# Patient Record
Sex: Female | Born: 1995 | Race: Black or African American | Hispanic: No | Smoking: Never smoker
Health system: Southern US, Community
[De-identification: ages and names within clinical notes are randomized; demographics above are authoritative.]

## PROBLEM LIST (undated history)

## (undated) DIAGNOSIS — D251 Intramural leiomyoma of uterus: Secondary | ICD-10-CM

## (undated) DIAGNOSIS — K5090 Crohn's disease, unspecified, without complications: Secondary | ICD-10-CM

## (undated) DIAGNOSIS — N920 Excessive and frequent menstruation with regular cycle: Secondary | ICD-10-CM

## (undated) HISTORY — DX: Intramural leiomyoma of uterus: D25.1

## (undated) HISTORY — PX: TONSILLECTOMY: SUR1361

## (undated) HISTORY — DX: Excessive and frequent menstruation with regular cycle: N92.0

## (undated) HISTORY — PX: BOWEL RESECTION: SHX1257

## (undated) HISTORY — PX: ADENOIDECTOMY: SUR15

---
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
## 2015-01-25 ENCOUNTER — Emergency Department (HOSPITAL_COMMUNITY)
Admission: EM | Admit: 2015-01-25 | Discharge: 2015-01-25 | Disposition: A | Discharge status: Home / Self Care | Payer: 59 | Attending: Emergency Medicine | Admitting: Emergency Medicine

## 2015-01-25 ENCOUNTER — Emergency Department (HOSPITAL_COMMUNITY): Payer: 59

## 2015-01-25 ENCOUNTER — Encounter (HOSPITAL_COMMUNITY): Payer: Self-pay

## 2015-01-25 DIAGNOSIS — R197 Diarrhea, unspecified: Secondary | ICD-10-CM | POA: Insufficient documentation

## 2015-01-25 DIAGNOSIS — Z3202 Encounter for pregnancy test, result negative: Secondary | ICD-10-CM | POA: Diagnosis not present

## 2015-01-25 DIAGNOSIS — R1031 Right lower quadrant pain: Secondary | ICD-10-CM | POA: Diagnosis present

## 2015-01-25 DIAGNOSIS — N858 Other specified noninflammatory disorders of uterus: Secondary | ICD-10-CM | POA: Diagnosis not present

## 2015-01-25 DIAGNOSIS — J45909 Unspecified asthma, uncomplicated: Secondary | ICD-10-CM | POA: Insufficient documentation

## 2015-01-25 DIAGNOSIS — R110 Nausea: Secondary | ICD-10-CM | POA: Diagnosis not present

## 2015-01-25 DIAGNOSIS — E119 Type 2 diabetes mellitus without complications: Secondary | ICD-10-CM | POA: Insufficient documentation

## 2015-01-25 DIAGNOSIS — Z8719 Personal history of other diseases of the digestive system: Secondary | ICD-10-CM | POA: Insufficient documentation

## 2015-01-25 DIAGNOSIS — R109 Unspecified abdominal pain: Secondary | ICD-10-CM

## 2015-01-25 DIAGNOSIS — N949 Unspecified condition associated with female genital organs and menstrual cycle: Secondary | ICD-10-CM

## 2015-01-25 LAB — CBC WITH DIFFERENTIAL/PLATELET
BASOS ABS: 0 10*3/uL (ref 0.0–0.1)
Basophils Relative: 0 % (ref 0–1)
EOS PCT: 2 % (ref 0–5)
Eosinophils Absolute: 0.2 10*3/uL (ref 0.0–0.7)
HEMATOCRIT: 27.1 % — AB (ref 36.0–46.0)
HEMOGLOBIN: 7.5 g/dL — AB (ref 12.0–15.0)
LYMPHS PCT: 16 % (ref 12–46)
Lymphs Abs: 1.6 10*3/uL (ref 0.7–4.0)
MCH: 18.7 pg — AB (ref 26.0–34.0)
MCHC: 27.7 g/dL — AB (ref 30.0–36.0)
MCV: 67.6 fL — AB (ref 78.0–100.0)
MONO ABS: 1 10*3/uL (ref 0.1–1.0)
Monocytes Relative: 10 % (ref 3–12)
NEUTROS PCT: 72 % (ref 43–77)
Neutro Abs: 7.4 10*3/uL (ref 1.7–7.7)
Platelets: 620 10*3/uL — ABNORMAL HIGH (ref 150–400)
RBC: 4.01 MIL/uL (ref 3.87–5.11)
RDW: 20.6 % — ABNORMAL HIGH (ref 11.5–15.5)
WBC: 10.2 10*3/uL (ref 4.0–10.5)

## 2015-01-25 LAB — URINALYSIS, ROUTINE W REFLEX MICROSCOPIC
BILIRUBIN URINE: NEGATIVE
Glucose, UA: NEGATIVE mg/dL
HGB URINE DIPSTICK: NEGATIVE
KETONES UR: NEGATIVE mg/dL
LEUKOCYTES UA: NEGATIVE
Nitrite: NEGATIVE
PROTEIN: NEGATIVE mg/dL
SPECIFIC GRAVITY, URINE: 1.01 (ref 1.005–1.030)
Urobilinogen, UA: 2 mg/dL — ABNORMAL HIGH (ref 0.0–1.0)
pH: 7 (ref 5.0–8.0)

## 2015-01-25 LAB — BASIC METABOLIC PANEL
Anion gap: 7 (ref 5–15)
BUN: 8 mg/dL (ref 6–20)
CALCIUM: 8.3 mg/dL — AB (ref 8.9–10.3)
CO2: 26 mmol/L (ref 22–32)
Chloride: 104 mmol/L (ref 101–111)
Creatinine, Ser: 0.75 mg/dL (ref 0.44–1.00)
GFR calc Af Amer: >60 mL/min (ref >60)
Glucose, Bld: 108 mg/dL — ABNORMAL HIGH (ref 65–99)
Potassium: 3.7 mmol/L (ref 3.5–5.1)
Sodium: 137 mmol/L (ref 135–145)

## 2015-01-25 LAB — PREGNANCY, URINE: PREG TEST UR: NEGATIVE

## 2015-01-25 MED ORDER — ONDANSETRON HCL 4 MG/2ML IJ SOLN
4.0000 mg | Freq: Once | INTRAMUSCULAR | Status: AC
  Administered 2015-01-25: 4 mg via INTRAVENOUS
  Filled 2015-01-25: qty 2

## 2015-01-25 MED ORDER — IOHEXOL 300 MG/ML  SOLN
50.0000 mL | Freq: Once | INTRAMUSCULAR | Status: AC | PRN
  Administered 2015-01-25: 50 mL via ORAL

## 2015-01-25 MED ORDER — ONDANSETRON 4 MG PO TBDP: 4.0000 mg | ORAL_TABLET | Freq: Three times a day (TID) | ORAL | Status: DC | PRN

## 2015-01-25 MED ORDER — HYDROCODONE-ACETAMINOPHEN 5-325 MG PO TABS: 1.0000 | ORAL_TABLET | Freq: Four times a day (QID) | ORAL | Status: DC | PRN

## 2015-01-25 MED ORDER — SODIUM CHLORIDE 0.9 % IV BOLUS (SEPSIS)
1000.0000 mL | Freq: Once | INTRAVENOUS | Status: AC
  Administered 2015-01-25: 1000 mL via INTRAVENOUS

## 2015-01-25 MED ORDER — MORPHINE SULFATE 4 MG/ML IJ SOLN
4.0000 mg | Freq: Once | INTRAMUSCULAR | Status: AC
  Administered 2015-01-25: 4 mg via INTRAVENOUS
  Filled 2015-01-25: qty 1

## 2015-01-25 MED ORDER — IOHEXOL 300 MG/ML  SOLN
100.0000 mL | Freq: Once | INTRAMUSCULAR | Status: AC | PRN
  Administered 2015-01-25: 100 mL via INTRAVENOUS

## 2015-01-25 MED ORDER — CIPROFLOXACIN HCL 500 MG PO TABS: 500.0000 mg | ORAL_TABLET | Freq: Two times a day (BID) | ORAL | Status: DC

## 2015-01-25 MED ORDER — METRONIDAZOLE 500 MG PO TABS: 500.0000 mg | ORAL_TABLET | Freq: Two times a day (BID) | ORAL | Status: DC

## 2015-01-25 NOTE — ED Notes (Signed)
Pt complains of right abd pain for three weeks, no vomiting, a little diarrhea and she states that sometimes she's nauseated

## 2015-01-25 NOTE — ED Provider Notes (Signed)
CSN: [id number]     Arrival date & time 01/25/15  0041 History   First MD Initiated Contact with Patient 01/25/15 0200     Chief Complaint  Patient presents with  . Abdominal Pain     (Consider location/radiation/quality/duration/timing/severity/associated sxs/prior Treatment) HPI Comments: Patient is a 19 year old female past medical history significant for DM, asthma, Crohn's disease presenting to the emergency department for 3 weeks of worsening right lower quadrant pain with associated nausea and intermittent episodes of nonbloody diarrhea. She states she has tried taking ibuprofen with no improvement. She states her pain is worsened significantly when she walks or coughs this evening. She denies any fevers, vomiting, recent antibody use or travel outside the [place]. No abdominal surgical history noted.  Patient is a 19 y.o. female presenting with abdominal pain.  Abdominal Pain Pain location:  RLQ Pain radiates to:  Does not radiate Pain severity:  Severe Onset quality:  Gradual Duration:  3 weeks Timing:  Constant Progression:  Worsening Context: awakening from sleep   Relieved by:  Nothing Worsened by:  Coughing and position changes Ineffective treatments:  NSAIDs Associated symptoms: diarrhea and nausea   Associated symptoms: no chest pain, no fever, no hematuria, no shortness of breath, no vaginal bleeding, no vaginal discharge and no vomiting   Risk factors: has not had multiple surgeries and not pregnant     Past Medical History  Diagnosis Date  . Crohn's disease   . Asthma   . Diabetes mellitus    Past Surgical History  Procedure Laterality Date  . Tonsillectomy    . Adenoidectomy     History reviewed. No pertinent family history. History  Substance Use Topics  . Smoking status: Never Smoker   . Smokeless tobacco: Not on file  . Alcohol Use: No   OB History    No data available     Review of Systems  Constitutional: Negative for fever.  Respiratory:  Negative for shortness of breath.   Cardiovascular: Negative for chest pain.  Gastrointestinal: Positive for nausea, abdominal pain and diarrhea. Negative for vomiting.  Genitourinary: Negative for hematuria, vaginal bleeding and vaginal discharge.  All other systems reviewed and are negative.     Allergies  Review of patient's allergies indicates no known allergies.  Home Medications   Prior to Admission medications   Medication Sig Start Date End Date Taking? Authorizing Provider  ibuprofen (ADVIL,MOTRIN) 200 MG tablet Take 400 mg by mouth 2 (two) times daily as needed (pain).   Yes Historical Provider, MD  ciprofloxacin (CIPRO) 500 MG tablet Take 1 tablet (500 mg total) by mouth every 12 (twelve) hours. 01/25/15   Jasiah Elsen, PA-C  HYDROcodone-acetaminophen (NORCO/VICODIN) 5-325 MG per tablet Take 1-2 tablets by mouth every 6 (six) hours as needed for severe pain. 01/25/15   Riaz Onorato, PA-C  metroNIDAZOLE (FLAGYL) 500 MG tablet Take 1 tablet (500 mg total) by mouth 2 (two) times daily. 01/25/15   Angelmarie Ponzo, PA-C  ondansetron (ZOFRAN ODT) 4 MG disintegrating tablet Take 1 tablet (4 mg total) by mouth every 8 (eight) hours as needed for nausea. 01/25/15   Aarush Stukey, PA-C   BP 120/66 mmHg  Pulse 87  Temp(Src) 97.8 F (36.6 C) (Oral)  Resp 18  SpO2 96%  LMP 12/22/2014 Physical Exam  Constitutional: She is oriented to person, place, and time. She appears well-developed and well-nourished.  HENT:  Head: Normocephalic and atraumatic.  Eyes: Conjunctivae are normal.  Neck: Neck supple.  Cardiovascular: Normal rate, regular rhythm  and normal heart sounds.   Abdominal: Soft. Bowel sounds are normal. There is tenderness in the right lower quadrant. There is no guarding.  Musculoskeletal: Normal range of motion.  Neurological: She is alert and oriented to person, place, and time.  Skin: Skin is warm and dry.  Nursing note and vitals  reviewed.   ED Course  Procedures (including critical care time) Medications  sodium chloride 0.9 % bolus 1,000 mL (0 mLs Intravenous Stopped 01/25/15 0529)  ondansetron (ZOFRAN) injection 4 mg (4 mg Intravenous Given 01/25/15 0249)  morphine 4 MG/ML injection 4 mg (4 mg Intravenous Given 01/25/15 0249)  iohexol (OMNIPAQUE) 300 MG/ML solution 50 mL (50 mLs Oral Contrast Given 01/25/15 0318)  iohexol (OMNIPAQUE) 300 MG/ML solution 100 mL (100 mLs Intravenous Contrast Given 01/25/15 0424)    Labs Review Labs Reviewed  CBC WITH DIFFERENTIAL/PLATELET - Abnormal; Notable for the following:    Hemoglobin 7.5 (*)    HCT 27.1 (*)    MCV 67.6 (*)    MCH 18.7 (*)    MCHC 27.7 (*)    RDW 20.6 (*)    Platelets 620 (*)    All other components within normal limits  BASIC METABOLIC PANEL - Abnormal; Notable for the following:    Glucose, Bld 108 (*)    Calcium 8.3 (*)    All other components within normal limits  URINALYSIS, ROUTINE W REFLEX MICROSCOPIC (NOT AT [facility]) - Abnormal; Notable for the following:    APPearance CLOUDY (*)    Urobilinogen, UA 2.0 (*)    All other components within normal limits  PREGNANCY, URINE    Imaging Review Ct Abdomen Pelvis W Contrast  01/25/2015   CLINICAL DATA:  RIGHT lower abdominal pain for 3 weeks, nausea, diarrhea. History of Crohn's and diabetes.  EXAM: CT ABDOMEN AND PELVIS WITH CONTRAST  TECHNIQUE: Multidetector CT imaging of the abdomen and pelvis was performed using the standard protocol following bolus administration of intravenous contrast.  CONTRAST:  OMNIPAQUE IOHEXOL 300 MG/ML  SOLN  COMPARISON:  CT abdomen and pelvis May 24, 2011  FINDINGS: LUNG BASES: Included view of the lung bases are clear. Visualized heart and pericardium are unremarkable.  SOLID ORGANS: The liver, spleen, gallbladder, pancreas and adrenal glands are unremarkable.  GASTROINTESTINAL TRACT: Hyperemic, severely edematous, inflamed terminal ileum and cecum. Surrounding  inflammatory changes and effusion without abscess. Enteric contrast has not yet reached the distal small bowel. The appendix is not discretely identified, however there are no inflammatory changes in the right lower quadrant. No bowel obstruction.  KIDNEYS/ URINARY TRACT: Kidneys are orthotopic, demonstrating symmetric enhancement. No nephrolithiasis, hydronephrosis or solid renal masses. The unopacified ureters are normal in course and caliber. Urinary bladder is partially distended and unremarkable.  PERITONEUM/RETROPERITONEUM: Aortoiliac vessels are normal in course and caliber. Mild RIGHT lower quadrant lymphadenopathy is likely likely reactive. 6.2 x 2.7 cm homogeneously hypodense LEFT adnexal simple cyst. No intraperitoneal free air.  SOFT TISSUE/OSSEOUS STRUCTURES: Non-suspicious.  IMPRESSION: Severe inflammatory changes of the terminal ileum/cecum consistent with acute Crohn's disease. No immediate complication.  6.2 x 2.7 cm LEFT adnexal cyst for which follow-up pelvic ultrasound in 6-12 weeks is recommended. This recommendation follows ACR consensus guidelines: White Paper of the ACR Incidental Findings Committee II on Adnexal Findings. J Am Coll Radiol [phone].   Electronically Signed   By: Awilda Metro M.D.   On: 01/25/2015 04:58     EKG Interpretation None      5:10 AM pain is improved. Patient  does not have a gastroenterologist, has not had a colonoscopy. Discussed CT scan and laboratory results with patient. Discussed discharge home and antibiotics and required follow-ups.  MDM   Final diagnoses:  Abdominal pain in female patient  Adnexal cyst    Filed Vitals:   01/25/15 0349  BP: 120/66  Pulse: 87  Temp:   Resp: 18   Afebrile, NAD, non-toxic appearing, AAOx4.   I have reviewed nursing notes, vital signs, and all lab and imaging results as noted above.  Patient is nontoxic, nonseptic appearing, in no apparent distress.  Patient's pain and other symptoms  adequately managed in emergency department.  Fluid bolus given.  Labs, imaging and vitals reviewed.  Patient does not meet the SIRS or Sepsis criteria.  On repeat exam patient does not have a surgical abdomin and there are no peritoneal signs.  CT scan reveals severe inflammatory changes terminal ileum/cecum consistent with acute Crohn's disease also noted to have a large left adnexal cyst. Given patient has not seen a gastroenterologist or had a colonoscopy will hold on cigarettes of this time will treat with anti-biotics pain medicine.  Patient discharged home with symptomatic treatment and given strict instructions for follow-up with their primary care physician, gastroenterologist and OB/GYN.  I have also discussed reasons to return immediately to the ER.  Patient expresses understanding and agrees with plan. Patient d/w with Dr. Norlene Campbell, agrees with plan.        Francee Piccolo, PA-C 01/25/15 0543  Marisa Severin, MD 01/25/15 [phone]

## 2015-01-25 NOTE — ED Notes (Signed)
Pt states that it hurts worse when she walks or coughs

## 2015-01-25 NOTE — Discharge Instructions (Signed)
Please follow up with your primary care physician in 1-2 days. If you do not have one please call the [facility] and wellness Center number listed above. Please follow up with the gastroenterologist at [facility] to schedule a follow up appointment.  Please follow up with OB/GYN to schedule a follow up appointment. Please take pain medication and/or muscle relaxants as prescribed and as needed for pain. Please do not drive on narcotic pain medication or on muscle relaxants. Please take your antibiotic until completion. Please read all discharge instructions and return precautions.   Crohn Disease Crohn disease is a long-term (chronic) soreness and redness (inflammation) of the intestines (bowel). It can affect any portion of the digestive tract, from the mouth to the anus. It can also cause problems outside the digestive tract. Crohn disease is closely related to a disease called ulcerative colitis (together, these two diseases are called inflammatory bowel disease).  CAUSES  The cause of Crohn disease is not known. One Nelva Bush is that, in an easily affected person, the immune system is triggered to attack the body's own digestive tissue. Crohn disease runs in families. It seems to be more common in certain geographic areas and amongst certain races. There are no clear-cut dietary causes.  SYMPTOMS  Crohn disease can cause many different symptoms since it can affect many different parts of the body. Symptoms include:  Fatigue.  Weight loss.  Chronic diarrhea, sometime bloody.  Abdominal pain and cramps.  Fever.  Ulcers or canker sores in the mouth or rectum.  Anemia (low red blood cells).  Arthritis, skin problems, and eye problems may occur. Complications of Crohn disease can include:  Series of holes (perforation) of the bowel.  Portions of the intestines sticking to each other (adhesions).  Obstruction of the bowel.  Fistula formation, typically in the rectal area but also in other  areas. A fistula is an opening between the bowels and the outside, or between the bowels and another organ.  A painful crack in the mucous membrane of the anus (rectal fissure). DIAGNOSIS  Your caregiver may suspect Crohn disease based on your symptoms and an exam. Blood tests may confirm that there is a problem. You may be asked to submit a stool specimen for examination. X-rays and CT scans may be necessary. Ultimately, the diagnosis is usually made after a procedure that uses a flexible tube that is inserted via your mouth or your anus. This is done under sedation and is called either an upper endoscopy or colonoscopy. With these tests, the specialist can take tiny tissue samples and remove them from the inside of the bowel (biopsy). Examination of this biopsy tissue under a microscope can reveal Crohn disease as the cause of your symptoms. Due to the many different forms that Crohn disease can take, symptoms may be present for several years before a diagnosis is made. TREATMENT  Medications are often used to decrease inflammation and control the immune system. These include medicines related to aspirin, steroid medications, and newer and stronger medications to slow down the immune system. Some medications may be used as suppositories or enemas. A number of other medications are used or have been studied. Your caregiver will make specific recommendations. HOME CARE INSTRUCTIONS   Symptoms such as diarrhea can be controlled with medications. Avoid foods that have a laxative effect such as fresh fruit, vegetables, and dairy products. During flare-ups, you can rest your bowel by refraining from solid foods. Drink clear liquids frequently during the day. (Electrolyte or  rehydrating fluids are best. Your caregiver can help you with suggestions.) Drink often to prevent loss of body fluids (dehydration). When diarrhea has cleared, eat small meals and more frequently. Avoid food additives and stimulants such  as caffeine (coffee, tea, or chocolate). Enzyme supplements may help if you develop intolerance to a sugar in dairy products (lactose). Ask your caregiver or dietitian about specific dietary instructions.  Try to maintain a positive attitude. Learn relaxation techniques such as self-hypnosis, mental imaging, and muscle relaxation.  If possible, avoid stresses which can aggravate your condition.  Exercise regularly.  Follow your diet.  Always get plenty of rest. SEEK MEDICAL CARE IF:   Your symptoms fail to improve after a week or two of new treatment.  You experience continued weight loss.  You have ongoing cramps or loose bowels.  You develop a new skin rash, skin sores, or eye problems. SEEK IMMEDIATE MEDICAL CARE IF:   You have worsening of your symptoms or develop new symptoms.  You have a fever.  You develop bloody diarrhea.  You develop severe abdominal pain. MAKE SURE YOU:   Understand these instructions.  Will watch your condition.  Will get help right away if you are not doing well or get worse. Document Released: 04/04/2005 Document Revised: 11/09/2013 Document Reviewed: 03/03/2007 [facility] Patient Information 2015 [place], [place]. This information is not intended to replace advice given to you by your health care provider. Make sure you discuss any questions you have with your health care provider.  Ovarian Cyst An ovarian cyst is a fluid-filled sac that forms on an ovary. The ovaries are small organs that produce eggs in women. Various types of cysts can form on the ovaries. Most are not cancerous. Many do not cause problems, and they often go away on their own. Some may cause symptoms and require treatment. Common types of ovarian cysts include:  Functional cysts--These cysts may occur every month during the menstrual cycle. This is normal. The cysts usually go away with the next menstrual cycle if the woman does not get pregnant. Usually, there are no symptoms  with a functional cyst.  Endometrioma cysts--These cysts form from the tissue that lines the uterus. They are also called "chocolate cysts" because they become filled with blood that turns brown. This type of cyst can cause pain in the lower abdomen during intercourse and with your menstrual period.  Cystadenoma cysts--This type develops from the cells on the outside of the ovary. These cysts can get very big and cause lower abdomen pain and pain with intercourse. This type of cyst can twist on itself, cut off its blood supply, and cause severe pain. It can also easily rupture and cause a lot of pain.  Dermoid cysts--This type of cyst is sometimes found in both ovaries. These cysts may contain different kinds of body tissue, such as skin, teeth, hair, or cartilage. They usually do not cause symptoms unless they get very big.  Theca lutein cysts--These cysts occur when too much of a certain hormone (human chorionic gonadotropin) is produced and overstimulates the ovaries to produce an egg. This is most common after procedures used to assist with the conception of a baby (in vitro fertilization). CAUSES   Fertility drugs can cause a condition in which multiple large cysts are formed on the ovaries. This is called ovarian hyperstimulation syndrome.  A condition called polycystic ovary syndrome can cause hormonal imbalances that can lead to nonfunctional ovarian cysts. SIGNS AND SYMPTOMS  Many ovarian cysts do  not cause symptoms. If symptoms are present, they may include:  Pelvic pain or pressure.  Pain in the lower abdomen.  Pain during sexual intercourse.  Increasing girth (swelling) of the abdomen.  Abnormal menstrual periods.  Increasing pain with menstrual periods.  Stopping having menstrual periods without being pregnant. DIAGNOSIS  These cysts are commonly found during a routine or annual pelvic exam. Tests may be ordered to find out more about the cyst. These tests may  include:  Ultrasound.  X-ray of the pelvis.  CT scan.  MRI.  Blood tests. TREATMENT  Many ovarian cysts go away on their own without treatment. Your health care provider may want to check your cyst regularly for 2-3 months to see if it changes. For women in menopause, it is particularly important to monitor a cyst closely because of the higher rate of ovarian cancer in menopausal women. When treatment is needed, it may include any of the following:  A procedure to drain the cyst (aspiration). This may be done using a long needle and ultrasound. It can also be done through a laparoscopic procedure. This involves using a thin, lighted tube with a tiny camera on the end (laparoscope) inserted through a small incision.  Surgery to remove the whole cyst. This may be done using laparoscopic surgery or an open surgery involving a larger incision in the lower abdomen.  Hormone treatment or birth control pills. These methods are sometimes used to help dissolve a cyst. HOME CARE INSTRUCTIONS   Only take over-the-counter or prescription medicines as directed by your health care provider.  Follow up with your health care provider as directed.  Get regular pelvic exams and Pap tests. SEEK MEDICAL CARE IF:   Your periods are late, irregular, or painful, or they stop.  Your pelvic pain or abdominal pain does not go away.  Your abdomen becomes larger or swollen.  You have pressure on your bladder or trouble emptying your bladder completely.  You have pain during sexual intercourse.  You have feelings of fullness, pressure, or discomfort in your stomach.  You lose weight for no apparent reason.  You feel generally ill.  You become constipated.  You lose your appetite.  You develop acne.  You have an increase in body and facial hair.  You are gaining weight, without changing your exercise and eating habits.  You think you are pregnant. SEEK IMMEDIATE MEDICAL CARE IF:   You have  increasing abdominal pain.  You feel sick to your stomach (nauseous), and you throw up (vomit).  You develop a fever that comes on suddenly.  You have abdominal pain during a bowel movement.  Your menstrual periods become heavier than usual. MAKE SURE YOU:  Understand these instructions.  Will watch your condition.  Will get help right away if you are not doing well or get worse. Document Released: 06/25/2005 Document Revised: 06/30/2013 Document Reviewed: 03/02/2013 [facility] Patient Information 2015 [place], [place]. This information is not intended to replace advice given to you by your health care provider. Make sure you discuss any questions you have with your health care provider.

## 2019-12-02 ENCOUNTER — Encounter: Payer: Self-pay | Admitting: Internal Medicine

## 2019-12-02 ENCOUNTER — Ambulatory Visit (INDEPENDENT_AMBULATORY_CARE_PROVIDER_SITE_OTHER): Payer: 59 | Admitting: Internal Medicine

## 2019-12-02 ENCOUNTER — Other Ambulatory Visit: Payer: Self-pay

## 2019-12-02 VITALS — BP 128/82 | HR 104 | Temp 98.0°F | Ht 63.0 in | Wt 215.2 lb

## 2019-12-02 DIAGNOSIS — Z6838 Body mass index (BMI) 38.0-38.9, adult: Secondary | ICD-10-CM

## 2019-12-02 DIAGNOSIS — E559 Vitamin D deficiency, unspecified: Secondary | ICD-10-CM

## 2019-12-02 DIAGNOSIS — E1100 Type 2 diabetes mellitus with hyperosmolarity without nonketotic hyperglycemic-hyperosmolar coma (NKHHC): Secondary | ICD-10-CM

## 2019-12-02 DIAGNOSIS — K50919 Crohn's disease, unspecified, with unspecified complications: Secondary | ICD-10-CM

## 2019-12-02 DIAGNOSIS — Z23 Encounter for immunization: Secondary | ICD-10-CM

## 2019-12-02 NOTE — Patient Instructions (Signed)
Diabetes Mellitus and Exercise Exercising regularly is important for your overall health, especially when you have diabetes (diabetes mellitus). Exercising is not only about losing weight. It has many other health benefits, such as increasing muscle strength and bone density and reducing body fat and stress. This leads to improved fitness, flexibility, and endurance, all of which result in better overall health. Exercise has additional benefits for people with diabetes, including:  Reducing appetite.  Helping to lower and control blood glucose.  Lowering blood pressure.  Helping to control amounts of fatty substances (lipids) in the blood, such as cholesterol and triglycerides.  Helping the body to respond better to insulin (improving insulin sensitivity).  Reducing how much insulin the body needs.  Decreasing the risk for heart disease by: ? Lowering cholesterol and triglyceride levels. ? Increasing the levels of good cholesterol. ? Lowering blood glucose levels. What is my activity plan? Your health care provider or certified diabetes educator can help you make a plan for the type and frequency of exercise (activity plan) that works for you. Make sure that you:  Do at least 150 minutes of moderate-intensity or vigorous-intensity exercise each week. This could be brisk walking, biking, or water aerobics. ? Do stretching and strength exercises, such as yoga or weightlifting, at least 2 times a week. ? Spread out your activity over at least 3 days of the week.  Get some form of physical activity every day. ? Do not go more than 2 days in a row without some kind of physical activity. ? Avoid being inactive for more than 30 minutes at a time. Take frequent breaks to walk or stretch.  Choose a type of exercise or activity that you enjoy, and set realistic goals.  Start slowly, and gradually increase the intensity of your exercise over time. What do I need to know about managing my  diabetes?   Check your blood glucose before and after exercising. ? If your blood glucose is 240 mg/dL (13.3 mmol/L) or higher before you exercise, check your urine for ketones. If you have ketones in your urine, do not exercise until your blood glucose returns to normal. ? If your blood glucose is 100 mg/dL (5.6 mmol/L) or lower, eat a snack containing 15-20 grams of carbohydrate. Check your blood glucose 15 minutes after the snack to make sure that your level is above 100 mg/dL (5.6 mmol/L) before you start your exercise.  Know the symptoms of low blood glucose (hypoglycemia) and how to treat it. Your risk for hypoglycemia increases during and after exercise. Common symptoms of hypoglycemia can include: ? Hunger. ? Anxiety. ? Sweating and feeling clammy. ? Confusion. ? Dizziness or feeling light-headed. ? Increased heart rate or palpitations. ? Blurry vision. ? Tingling or numbness around the mouth, lips, or tongue. ? Tremors or shakes. ? Irritability.  Keep a rapid-acting carbohydrate snack available before, during, and after exercise to help prevent or treat hypoglycemia.  Avoid injecting insulin into areas of the body that are going to be exercised. For example, avoid injecting insulin into: ? The arms, when playing tennis. ? The legs, when jogging.  Keep records of your exercise habits. Doing this can help you and your health care provider adjust your diabetes management plan as needed. Write down: ? Food that you eat before and after you exercise. ? Blood glucose levels before and after you exercise. ? The type and amount of exercise you have done. ? When your insulin is expected to peak, if you use   insulin. Avoid exercising at times when your insulin is peaking.  When you start a new exercise or activity, work with your health care provider to make sure the activity is safe for you, and to adjust your insulin, medicines, or food intake as needed.  Drink plenty of water while  you exercise to prevent dehydration or heat stroke. Drink enough fluid to keep your urine clear or pale yellow. Summary  Exercising regularly is important for your overall health, especially when you have diabetes (diabetes mellitus).  Exercising has many health benefits, such as increasing muscle strength and bone density and reducing body fat and stress.  Your health care provider or certified diabetes educator can help you make a plan for the type and frequency of exercise (activity plan) that works for you.  When you start a new exercise or activity, work with your health care provider to make sure the activity is safe for you, and to adjust your insulin, medicines, or food intake as needed. This information is not intended to replace advice given to you by your health care provider. Make sure you discuss any questions you have with your health care provider. Document Revised: 01/17/2017 Document Reviewed: 12/05/2015 Elsevier Patient Education  2020 Elsevier Inc.  

## 2019-12-04 NOTE — Progress Notes (Signed)
This visit occurred during the SARS-CoV-2 public health emergency.  Safety protocols were in place, including screening questions prior to the visit, additional usage of staff PPE, and extensive cleaning of exam room while observing appropriate contact time as indicated for disinfecting solutions.  Subjective:     Patient ID: Connie Williamson , female    DOB: [birth year] , 24 y.o.   MRN: [id number]   Chief Complaint  Patient presents with  . Establish Care  . Diabetes    HPI  She is here today to establish care.  She was referred by her mother, A. B., who is also a patient of [facility] . She reports having a history of type 2 diabetes, but states she was diagnosed with diabetes at age 56. She contacted her mother during the visit, who confirmed what the patient said. She is not followed by Endocrinology, states she has been sharing her Mom's insulin. She is currently taking Tresiba 60 units at night. She checks her sugars sporadically, but admits they are elevated.  She adds that she lost over 50 pounds over the past several years.  She also reports h/o Crohn's disease. She is followed by [facility].   She currently works from home. She works for [employer], in Therapist, art. She has been doing this for about three months. She is an alum of [employer], she received Bachelor's in [employer].   Diabetes She presents for her initial diabetic visit. No MedicAlert identification noted. The initial diagnosis of diabetes was made 15 years ago. Her disease course has been worsening. There are no hypoglycemic associated symptoms. Associated symptoms include polydipsia and polyuria. There are no hypoglycemic complications. Risk factors for coronary artery disease include diabetes mellitus, obesity and sedentary lifestyle. Current diabetic treatment includes diet and insulin injections. Her breakfast blood glucose is taken between 9-10 am. Her breakfast blood glucose range is generally 140-180 mg/dl.      Past Medical History:  Diagnosis Date  . Asthma   . Crohn's disease ([place])   . Diabetes mellitus      Family History  Problem Relation Age of Onset  . Diabetes Mother   . Kidney failure Mother   . Hypertension Mother   . Healthy Father   . Healthy Sister   . Healthy Brother   . Diabetes Maternal Grandmother   . Hypertension Maternal Grandmother   . Diabetes Maternal Grandfather   . Hypertension Maternal Grandfather      Current Outpatient Medications:  .  ibuprofen (ADVIL,MOTRIN) 200 MG tablet, Take 400 mg by mouth 2 (two) times daily as needed (pain)., Disp: , Rfl:  .  ondansetron (ZOFRAN ODT) 4 MG disintegrating tablet, Take 1 tablet (4 mg total) by mouth every 8 (eight) hours as needed for nausea., Disp: 10 tablet, Rfl: 0 .  ciprofloxacin (CIPRO) 500 MG tablet, Take 1 tablet (500 mg total) by mouth every 12 (twelve) hours. (Patient not taking: Reported on 12/02/2019), Disp: 20 tablet, Rfl: 0 .  HYDROcodone-acetaminophen (NORCO/VICODIN) 5-325 MG per tablet, Take 1-2 tablets by mouth every 6 (six) hours as needed for severe pain. (Patient not taking: Reported on 12/02/2019), Disp: 10 tablet, Rfl: 0 .  metroNIDAZOLE (FLAGYL) 500 MG tablet, Take 1 tablet (500 mg total) by mouth 2 (two) times daily. (Patient not taking: Reported on 12/02/2019), Disp: 20 tablet, Rfl: 0   No Known Allergies    The patient states she uses none for birth control. Last LMP was Patient's last menstrual period was 11/07/2019 (exact  date).. Negative for Dysmenorrhea  Negative for: breast discharge, breast lump(s), breast pain and breast self exam. Associated symptoms include abnormal vaginal bleeding. Pertinent negatives include abnormal bleeding (hematology), anxiety, decreased libido, depression, difficulty falling sleep, dyspareunia, history of infertility, nocturia, sexual dysfunction, sleep disturbances, urinary incontinence, urinary urgency, vaginal discharge and vaginal itching. Diet regular.The patient  states her exercise level is  intermittent.   . The patient's tobacco use is:  Social History   Tobacco Use  Smoking Status Never Smoker  Smokeless Tobacco Never Used  . She has been exposed to passive smoke. The patient's alcohol use is:  Social History   Substance and Sexual Activity  Alcohol Use Yes   Comment: Socially    Review of Systems  Constitutional: Negative.   HENT: Negative.   Respiratory: Negative.   Cardiovascular: Negative.   Gastrointestinal: Negative.   Endocrine: Positive for polydipsia and polyuria.  Genitourinary: Negative.   Neurological: Negative.   Psychiatric/Behavioral: Negative.      Today's Vitals   12/02/19 1455  BP: 128/82  Pulse: (!) 104  Temp: 98 F (36.7 C)  Weight: 215 lb 3.2 oz (97.6 kg)  Height: '5\' 3"'$  (1.6 m)   Body mass index is 38.12 kg/m.   Wt Readings from Last 3 Encounters:  12/02/19 215 lb 3.2 oz (97.6 kg)  06/12/11 158 lb (71.7 kg) (90 %, Z= 1.30)*   * Growth percentiles are based on CDC (Girls, 2-20 Years) data.     Objective:  Physical Exam Vitals and nursing note reviewed.  Constitutional:      Appearance: Normal appearance. She is obese.  HENT:     Head: Normocephalic and atraumatic.  Cardiovascular:     Rate and Rhythm: Normal rate and regular rhythm.     Heart sounds: Normal heart sounds.  Pulmonary:     Effort: Pulmonary effort is normal.     Breath sounds: Normal breath sounds.  Skin:    General: Skin is warm.  Neurological:     General: No focal deficit present.     Mental Status: She is alert.  Psychiatric:        Mood and Affect: Mood normal.        Behavior: Behavior normal.         Assessment And Plan:     1. Type 2 diabetes mellitus with hyperosmolarity without coma, without long-term current use of insulin ([place])  I will check labs as listed below. She is willing to see endocrinology if insulin and GAD antibodies are positive. She will continue with Tresiba, 60 units nightly for now.  She is encouraged to avoid sugary beverages and to incorporate more exercise into her daily routine.   - Glutamic acid decarboxylase auto abs - CMP14+EGFR - TSH - Hemoglobin A1c - CBC no Diff  2. Crohn's disease with complication, unspecified gastrointestinal tract location [facility])  Chronic, as per GI. I did review her records through [facility]. She is encouraged to keep all upcoming GI appointments.   3. Vitamin D deficiency disease  I WILL CHECK A VIT D LEVEL AND SUPPLEMENT AS NEEDED.  ALSO ENCOURAGED TO SPEND 15 MINUTES IN THE SUN DAILY.  - Vitamin D (25 hydroxy)  4. Class 2 severe obesity due to excess calories with serious comorbidity and body mass index (BMI) of 38.0 to 38.9 in adult [facility])  She was congratulated on her weight loss thus far and encouraged to strive for BMI less than 30 to decrease cardiac risk. She is advised  to exercise no less than 150 minutes per week. She is also encouraged to avoid sugary beverages.   5. Immunization due  She is due for Pneumovax-23. She would like to consider at her next visit.   Maximino Greenland, MD    THE PATIENT IS ENCOURAGED TO PRACTICE SOCIAL DISTANCING DUE TO THE COVID-19 PANDEMIC.

## 2019-12-08 LAB — CBC
Hematocrit: 46.8 % — ABNORMAL HIGH (ref 34.0–46.6)
Hemoglobin: 14.7 g/dL (ref 11.1–15.9)
MCH: 27.2 pg (ref 26.6–33.0)
MCHC: 31.4 g/dL — ABNORMAL LOW (ref 31.5–35.7)
MCV: 87 fL (ref 79–97)
Platelets: 417 10*3/uL (ref 150–450)
RBC: 5.4 x10E6/uL — ABNORMAL HIGH (ref 3.77–5.28)
RDW: 12.1 % (ref 11.7–15.4)
WBC: 11.7 10*3/uL — ABNORMAL HIGH (ref 3.4–10.8)

## 2019-12-08 LAB — HEMOGLOBIN A1C
Est. average glucose Bld gHb Est-mCnc: 387 mg/dL
Hgb A1c MFr Bld: 15.1 % — ABNORMAL HIGH (ref 4.8–5.6)

## 2019-12-08 LAB — CMP14+EGFR
ALT: 8 IU/L (ref 0–32)
AST: 8 IU/L (ref 0–40)
Albumin/Globulin Ratio: 1.2 (ref 1.2–2.2)
Albumin: 4.3 g/dL (ref 3.9–5.0)
Alkaline Phosphatase: 165 IU/L — ABNORMAL HIGH (ref 48–121)
BUN/Creatinine Ratio: 18 (ref 9–23)
BUN: 13 mg/dL (ref 6–20)
Bilirubin Total: <0.2 mg/dL (ref 0.0–1.2)
CO2: 18 mmol/L — ABNORMAL LOW (ref 20–29)
Calcium: 9.9 mg/dL (ref 8.7–10.2)
Chloride: 98 mmol/L (ref 96–106)
Creatinine, Ser: 0.74 mg/dL (ref 0.57–1.00)
GFR calc Af Amer: 130 mL/min/{1.73_m2} (ref >59)
GFR calc non Af Amer: 113 mL/min/{1.73_m2} (ref >59)
Globulin, Total: 3.6 g/dL (ref 1.5–4.5)
Glucose: 335 mg/dL — ABNORMAL HIGH (ref 65–99)
Potassium: 4.4 mmol/L (ref 3.5–5.2)
Sodium: 134 mmol/L (ref 134–144)
Total Protein: 7.9 g/dL (ref 6.0–8.5)

## 2019-12-08 LAB — GLUTAMIC ACID DECARBOXYLASE AUTO ABS: Glutamic Acid Decarb Ab: <5 U/mL (ref 0.0–5.0)

## 2019-12-08 LAB — VITAMIN D 25 HYDROXY (VIT D DEFICIENCY, FRACTURES): Vit D, 25-Hydroxy: 8.9 ng/mL — ABNORMAL LOW (ref 30.0–100.0)

## 2019-12-08 LAB — TSH: TSH: 1.95 u[IU]/mL (ref 0.450–4.500)

## 2019-12-09 ENCOUNTER — Other Ambulatory Visit: Payer: Self-pay

## 2019-12-09 MED ORDER — VITAMIN D3 1.25 MG (50000 UT) PO CAPS: ORAL_CAPSULE | ORAL | 1 refills | Status: DC

## 2019-12-09 MED ORDER — OZEMPIC (0.25 OR 0.5 MG/DOSE) 2 MG/1.5ML ~~LOC~~ SOPN: 0.5000 mg | PEN_INJECTOR | SUBCUTANEOUS | 1 refills | Status: DC

## 2019-12-29 ENCOUNTER — Encounter: Payer: Self-pay | Admitting: Internal Medicine

## 2019-12-30 ENCOUNTER — Other Ambulatory Visit: Payer: Self-pay

## 2019-12-30 ENCOUNTER — Ambulatory Visit (INDEPENDENT_AMBULATORY_CARE_PROVIDER_SITE_OTHER): Payer: 59 | Admitting: Internal Medicine

## 2019-12-30 ENCOUNTER — Encounter: Payer: Self-pay | Admitting: Internal Medicine

## 2019-12-30 VITALS — BP 122/80 | HR 88 | Temp 97.8°F | Ht 63.0 in | Wt 217.4 lb

## 2019-12-30 DIAGNOSIS — Z1159 Encounter for screening for other viral diseases: Secondary | ICD-10-CM | POA: Diagnosis not present

## 2019-12-30 DIAGNOSIS — Z23 Encounter for immunization: Secondary | ICD-10-CM

## 2019-12-30 DIAGNOSIS — E1165 Type 2 diabetes mellitus with hyperglycemia: Secondary | ICD-10-CM

## 2019-12-30 LAB — POCT UA - MICROALBUMIN
Creatinine, POC: 300 mg/dL
Microalbumin Ur, POC: 80 mg/L

## 2019-12-30 MED ORDER — TRESIBA FLEXTOUCH 200 UNIT/ML ~~LOC~~ SOPN: 62.0000 [IU] | PEN_INJECTOR | Freq: Every evening | SUBCUTANEOUS | 5 refills | Status: DC

## 2019-12-31 LAB — HEMOGLOBIN A1C
Est. average glucose Bld gHb Est-mCnc: 306 mg/dL
Hgb A1c MFr Bld: 12.3 % — ABNORMAL HIGH (ref 4.8–5.6)

## 2019-12-31 LAB — HEPATITIS C ANTIBODY: Hep C Virus Ab: <0.1 s/co ratio (ref 0.0–0.9)

## 2020-01-13 ENCOUNTER — Other Ambulatory Visit: Payer: Self-pay

## 2020-01-13 MED ORDER — PEN NEEDLES 32G X 4 MM MISC: 3 refills | Status: DC

## 2020-01-13 NOTE — Telephone Encounter (Signed)
The pt was told that Dr. Allyne Gee wanted to confirm that the pt wanted to wait until her A!C improves before Dr Allyne Gee completes the pt's DMV form.  The pt said yes that she wanted to wait until her a1c improves.

## 2020-01-17 ENCOUNTER — Encounter: Payer: Self-pay | Admitting: Internal Medicine

## 2020-01-17 NOTE — Progress Notes (Signed)
This visit occurred during the SARS-CoV-2 public health emergency.  Safety protocols were in place, including screening questions prior to the visit, additional usage of staff PPE, and extensive cleaning of exam room while observing appropriate contact time as indicated for disinfecting solutions.  Subjective:     Patient ID: Connie Williamson , female    DOB: 01/17/1996 , 24 y.o.   MRN: [id number]   Chief Complaint  Patient presents with  . Diabetes    HPI  She is here today for f/u diabetes. She feels well on her current regimen. She reports her sugars have improved greatly since starting Ozempic. She admits that she has also cut back on her intake of sodas.     Past Medical History:  Diagnosis Date  . Asthma   . Crohn's disease (HCC)   . Diabetes mellitus      Family History  Problem Relation Age of Onset  . Diabetes Mother   . Kidney failure Mother   . Hypertension Mother   . Healthy Father   . Healthy Sister   . Healthy Brother   . Diabetes Maternal Grandmother   . Hypertension Maternal Grandmother   . Diabetes Maternal Grandfather   . Hypertension Maternal Grandfather      Current Outpatient Medications:  .  Cholecalciferol (VITAMIN D3) 1.25 MG (50000 UT) CAPS, Take 1 capsule by mouth on Tuesday and Friday, Disp: 24 capsule, Rfl: 1 .  ibuprofen (ADVIL,MOTRIN) 200 MG tablet, Take 400 mg by mouth 2 (two) times daily as needed (pain)., Disp: , Rfl:  .  insulin degludec (TRESIBA FLEXTOUCH) 200 UNIT/ML FlexTouch Pen, Inject 62 Units into the skin at bedtime., Disp: 3 pen, Rfl: 5 .  ondansetron (ZOFRAN ODT) 4 MG disintegrating tablet, Take 1 tablet (4 mg total) by mouth every 8 (eight) hours as needed for nausea., Disp: 10 tablet, Rfl: 0 .  OZEMPIC, 0.25 OR 0.5 MG/DOSE, 2 MG/1.5ML SOPN, Inject 0.5 mg into the skin once a week., Disp: 1 pen, Rfl: 1 .  Insulin Pen Needle (PEN NEEDLES) 32G X 4 MM MISC, Use as directed with insulin pens, Disp: 100 each, Rfl: 3   No Known  Allergies   Review of Systems  Constitutional: Negative.   Respiratory: Negative.   Cardiovascular: Negative.   Gastrointestinal: Negative.   Neurological: Negative.   Psychiatric/Behavioral: Negative.      Today's Vitals   12/30/19 1527  BP: 122/80  Pulse: 88  Temp: 97.8 F (36.6 C)  TempSrc: Oral  Weight: 217 lb 6.4 oz (98.6 kg)  Height: 5\' 3"  (1.6 m)   Body mass index is 38.51 kg/m.   Objective:  Physical Exam Vitals and nursing note reviewed.  Constitutional:      Appearance: Normal appearance. She is obese.  HENT:     Head: Normocephalic and atraumatic.  Cardiovascular:     Rate and Rhythm: Normal rate and regular rhythm.     Heart sounds: Normal heart sounds.  Pulmonary:     Effort: Pulmonary effort is normal.     Breath sounds: Normal breath sounds.  Skin:    General: Skin is warm.  Neurological:     General: No focal deficit present.     Mental Status: She is alert.  Psychiatric:        Mood and Affect: Mood normal.        Behavior: Behavior normal.         Assessment And Plan:     1. Uncontrolled type 2 diabetes  mellitus with hyperglycemia (HCC)  I will increase her Tresiba to 62 units nightly. I plan to increase her Ozempic to 1mg  after another four weeks. She has also given me DMV paperwork to complete, but she has decided to wait another 2 months or so once her hba1c has had time to improve. Our goal is less than 8%.   - Hemoglobin A1c - POCT UA - Microalbumin  2. Encounter for HCV screening test for low risk patient  - Hepatitis C antibody  3. Immunization due  She was given Pneumovax to update her immunization history.     , MD    THE PATIENT IS ENCOURAGED TO PRACTICE SOCIAL DISTANCING DUE TO THE COVID-19 PANDEMIC.

## 2020-01-18 ENCOUNTER — Other Ambulatory Visit: Payer: Self-pay

## 2020-01-18 MED ORDER — FREESTYLE LIBRE 2 SENSOR MISC: 3 refills | Status: DC

## 2020-01-18 MED ORDER — OZEMPIC (1 MG/DOSE) 2 MG/1.5ML ~~LOC~~ SOPN: 1.0000 mg | PEN_INJECTOR | SUBCUTANEOUS | 3 refills | Status: DC

## 2020-01-18 MED ORDER — FREESTYLE LIBRE 2 READER DEVI: 3 refills | Status: DC

## 2020-02-03 ENCOUNTER — Ambulatory Visit (INDEPENDENT_AMBULATORY_CARE_PROVIDER_SITE_OTHER): Payer: 59 | Admitting: Internal Medicine

## 2020-02-03 ENCOUNTER — Encounter: Payer: Self-pay | Admitting: Internal Medicine

## 2020-02-03 ENCOUNTER — Other Ambulatory Visit: Payer: Self-pay

## 2020-02-03 VITALS — BP 120/70 | HR 98 | Temp 97.5°F | Ht 63.2 in | Wt 224.0 lb

## 2020-02-03 DIAGNOSIS — E1165 Type 2 diabetes mellitus with hyperglycemia: Secondary | ICD-10-CM | POA: Diagnosis not present

## 2020-02-03 DIAGNOSIS — Z6839 Body mass index (BMI) 39.0-39.9, adult: Secondary | ICD-10-CM

## 2020-02-03 MED ORDER — WEGOVY 1.7 MG/0.75ML ~~LOC~~ SOAJ: 1.7000 mg | SUBCUTANEOUS | 0 refills | Status: DC

## 2020-02-03 NOTE — Patient Instructions (Signed)
Diabetes Mellitus and Exercise Exercising regularly is important for your overall health, especially when you have diabetes (diabetes mellitus). Exercising is not only about losing weight. It has many other health benefits, such as increasing muscle strength and bone density and reducing body fat and stress. This leads to improved fitness, flexibility, and endurance, all of which result in better overall health. Exercise has additional benefits for people with diabetes, including:  Reducing appetite.  Helping to lower and control blood glucose.  Lowering blood pressure.  Helping to control amounts of fatty substances (lipids) in the blood, such as cholesterol and triglycerides.  Helping the body to respond better to insulin (improving insulin sensitivity).  Reducing how much insulin the body needs.  Decreasing the risk for heart disease by: ? Lowering cholesterol and triglyceride levels. ? Increasing the levels of good cholesterol. ? Lowering blood glucose levels. What is my activity plan? Your health care provider or certified diabetes educator can help you make a plan for the type and frequency of exercise (activity plan) that works for you. Make sure that you:  Do at least 150 minutes of moderate-intensity or vigorous-intensity exercise each week. This could be brisk walking, biking, or water aerobics. ? Do stretching and strength exercises, such as yoga or weightlifting, at least 2 times a week. ? Spread out your activity over at least 3 days of the week.  Get some form of physical activity every day. ? Do not go more than 2 days in a row without some kind of physical activity. ? Avoid being inactive for more than 30 minutes at a time. Take frequent breaks to walk or stretch.  Choose a type of exercise or activity that you enjoy, and set realistic goals.  Start slowly, and gradually increase the intensity of your exercise over time. What do I need to know about managing my  diabetes?   Check your blood glucose before and after exercising. ? If your blood glucose is 240 mg/dL (13.3 mmol/L) or higher before you exercise, check your urine for ketones. If you have ketones in your urine, do not exercise until your blood glucose returns to normal. ? If your blood glucose is 100 mg/dL (5.6 mmol/L) or lower, eat a snack containing 15-20 grams of carbohydrate. Check your blood glucose 15 minutes after the snack to make sure that your level is above 100 mg/dL (5.6 mmol/L) before you start your exercise.  Know the symptoms of low blood glucose (hypoglycemia) and how to treat it. Your risk for hypoglycemia increases during and after exercise. Common symptoms of hypoglycemia can include: ? Hunger. ? Anxiety. ? Sweating and feeling clammy. ? Confusion. ? Dizziness or feeling light-headed. ? Increased heart rate or palpitations. ? Blurry vision. ? Tingling or numbness around the mouth, lips, or tongue. ? Tremors or shakes. ? Irritability.  Keep a rapid-acting carbohydrate snack available before, during, and after exercise to help prevent or treat hypoglycemia.  Avoid injecting insulin into areas of the body that are going to be exercised. For example, avoid injecting insulin into: ? The arms, when playing tennis. ? The legs, when jogging.  Keep records of your exercise habits. Doing this can help you and your health care provider adjust your diabetes management plan as needed. Write down: ? Food that you eat before and after you exercise. ? Blood glucose levels before and after you exercise. ? The type and amount of exercise you have done. ? When your insulin is expected to peak, if you use   insulin. Avoid exercising at times when your insulin is peaking.  When you start a new exercise or activity, work with your health care provider to make sure the activity is safe for you, and to adjust your insulin, medicines, or food intake as needed.  Drink plenty of water while  you exercise to prevent dehydration or heat stroke. Drink enough fluid to keep your urine clear or pale yellow. Summary  Exercising regularly is important for your overall health, especially when you have diabetes (diabetes mellitus).  Exercising has many health benefits, such as increasing muscle strength and bone density and reducing body fat and stress.  Your health care provider or certified diabetes educator can help you make a plan for the type and frequency of exercise (activity plan) that works for you.  When you start a new exercise or activity, work with your health care provider to make sure the activity is safe for you, and to adjust your insulin, medicines, or food intake as needed. This information is not intended to replace advice given to you by your health care provider. Make sure you discuss any questions you have with your health care provider. Document Revised: 01/17/2017 Document Reviewed: 12/05/2015 Elsevier Patient Education  2020 Elsevier Inc.  

## 2020-02-03 NOTE — Progress Notes (Signed)
I,Tianna Badgett,acting as a Neurosurgeon for Gwynneth Aliment, MD.,have documented all relevant documentation on the behalf of Gwynneth Aliment, MD,as directed by  Gwynneth Aliment, MD while in the presence of Gwynneth Aliment, MD.  This visit occurred during the SARS-CoV-2 public health emergency.  Safety protocols were in place, including screening questions prior to the visit, additional usage of staff PPE, and extensive cleaning of exam room while observing appropriate contact time as indicated for disinfecting solutions.  Subjective:     Patient ID: Connie Williamson , female    DOB: May 27, 1996 , 24 y.o.   MRN: [id number]   Chief Complaint  Patient presents with  . Diabetes    HPI  She is here today for f/u diabetes. She feels well on her current regimen. She reports her sugars have improved greatly since starting Ozempic. She admits that she has also cut back on her intake of sodas. She has recently moved back in with her Mom. She has been exercising more frequently as well.     Past Medical History:  Diagnosis Date  . Asthma   . Crohn's disease (HCC)   . Diabetes mellitus      Family History  Problem Relation Age of Onset  . Diabetes Mother   . Kidney failure Mother   . Hypertension Mother   . Healthy Father   . Healthy Sister   . Healthy Brother   . Diabetes Maternal Grandmother   . Hypertension Maternal Grandmother   . Diabetes Maternal Grandfather   . Hypertension Maternal Grandfather      Current Outpatient Medications:  .  Cholecalciferol (VITAMIN D3) 1.25 MG (50000 UT) CAPS, Take 1 capsule by mouth on Tuesday and Friday, Disp: 24 capsule, Rfl: 1 .  Continuous Blood Gluc Receiver (FREESTYLE LIBRE 2 READER) DEVI, Use to check blood sugars. Dx code e11.65, Disp: 3 each, Rfl: 3 .  Continuous Blood Gluc Sensor (FREESTYLE LIBRE 2 SENSOR) MISC, Use to check blood sugar.  Dx code e11.65, Disp: 3 each, Rfl: 3 .  ibuprofen (ADVIL,MOTRIN) 200 MG tablet, Take 400 mg by mouth 2  (two) times daily as needed (pain)., Disp: , Rfl:  .  insulin degludec (TRESIBA FLEXTOUCH) 200 UNIT/ML FlexTouch Pen, Inject 62 Units into the skin at bedtime., Disp: 3 pen, Rfl: 5 .  Insulin Pen Needle (PEN NEEDLES) 32G X 4 MM MISC, Use as directed with insulin pens, Disp: 100 each, Rfl: 3 .  ondansetron (ZOFRAN ODT) 4 MG disintegrating tablet, Take 1 tablet (4 mg total) by mouth every 8 (eight) hours as needed for nausea., Disp: 10 tablet, Rfl: 0 .  Semaglutide, 1 MG/DOSE, (OZEMPIC, 1 MG/DOSE,) 2 MG/1.5ML SOPN, Inject 0.75 mLs (1 mg total) into the skin once a week., Disp: 3 mL, Rfl: 3 .  inFLIXimab (REMICADE) 100 MG injection, Inject into the vein., Disp: , Rfl:  .  Semaglutide-Weight Management (WEGOVY) 1.7 MG/0.75ML SOAJ, Inject 1.7 mg into the skin once a week., Disp: 4 mL, Rfl: 0   No Known Allergies   Review of Systems  Constitutional: Negative.   Respiratory: Negative.   Cardiovascular: Negative.   Gastrointestinal: Negative.   Neurological: Negative.      Today's Vitals   02/03/20 1629  BP: 120/70  Pulse: 98  Temp: (!) 97.5 F (36.4 C)  TempSrc: Oral  Weight: (!) 224 lb (101.6 kg)  Height: 5' 3.2" (1.605 m)   Body mass index is 39.43 kg/m.   Objective:  Physical Exam Vitals and  nursing note reviewed.  Constitutional:      Appearance: Normal appearance.  HENT:     Head: Normocephalic and atraumatic.  Cardiovascular:     Rate and Rhythm: Normal rate and regular rhythm.     Heart sounds: Normal heart sounds.  Pulmonary:     Effort: Pulmonary effort is normal.     Breath sounds: Normal breath sounds.  Skin:    General: Skin is warm.  Neurological:     General: No focal deficit present.     Mental Status: She is alert.  Psychiatric:        Mood and Affect: Mood normal.        Behavior: Behavior normal.         Assessment And Plan:     1. Uncontrolled type 2 diabetes mellitus with hyperglycemia (HCC) Comments: She was congratulated on her lifestyle  changes thus far. Even though it has not been 3 months, I will recheck hba1c. She is trying to get her license back, and DMV paperwork needs to be completed. Pt advised that I will complete if her a1c is less than 8.0. She is in agreement with her treatment plan.   - Hemoglobin A1c  2. Class 2 severe obesity due to excess calories with serious comorbidity and body mass index (BMI) of 39.0 to 39.9 in adult [facility])  Comments: She is aware of 7 pound weight gain. PT advised that weight gain partially due to improved blood sugars. We also discussed use of Wegovy to help her achieve her weight loss goals. I will switch her to [facility] 1.7mg  once weekly. She will rto in four weeks for re-evaluation. She is reminded to stop eating when she feels full.   Wt Readings from Last 3 Encounters:  02/03/20 (!) 224 lb (101.6 kg)  12/30/19 217 lb 6.4 oz (98.6 kg)  12/02/19 215 lb 3.2 oz (97.6 kg)      Patient was given opportunity to ask questions. Patient verbalized understanding of the plan and was able to repeat key elements of the plan. All questions were answered to their satisfaction.  Gwynneth Aliment, MD   I, Gwynneth Aliment, MD, have reviewed all documentation for this visit. The documentation on 02/07/20 for the exam, diagnosis, procedures, and orders are all accurate and complete.  THE PATIENT IS ENCOURAGED TO PRACTICE SOCIAL DISTANCING DUE TO THE COVID-19 PANDEMIC.

## 2020-02-04 LAB — HEMOGLOBIN A1C
Est. average glucose Bld gHb Est-mCnc: 177 mg/dL
Hgb A1c MFr Bld: 7.8 % — ABNORMAL HIGH (ref 4.8–5.6)

## 2020-02-12 ENCOUNTER — Telehealth: Payer: Self-pay

## 2020-02-12 NOTE — Telephone Encounter (Signed)
The patient was notified that Dr. Allyne Gee has completed her portion of the pt's DMV Forms, they have been faxed and that DR Allyne Gee said that the pt should also have her eye exam portion completed.  (I emailed the pt's copy along with confirmation of the fax.)

## 2020-03-09 ENCOUNTER — Encounter: Payer: Self-pay | Admitting: Internal Medicine

## 2020-05-16 ENCOUNTER — Encounter: Payer: 59 | Admitting: Internal Medicine

## 2020-05-17 ENCOUNTER — Encounter: Payer: Self-pay | Admitting: Internal Medicine

## 2020-05-17 ENCOUNTER — Other Ambulatory Visit: Payer: Self-pay

## 2020-05-17 MED ORDER — TRESIBA FLEXTOUCH 200 UNIT/ML ~~LOC~~ SOPN: 62.0000 [IU] | PEN_INJECTOR | Freq: Every evening | SUBCUTANEOUS | 3 refills | Status: DC

## 2020-05-17 MED ORDER — OZEMPIC (1 MG/DOSE) 4 MG/3ML ~~LOC~~ SOPN: 1.0000 mg | PEN_INJECTOR | SUBCUTANEOUS | 3 refills | Status: DC

## 2020-06-01 ENCOUNTER — Telehealth: Payer: Self-pay

## 2020-06-01 NOTE — Telephone Encounter (Signed)
lvm for patient to call office. Patient has not been seen since 01/2020 and cancelled phy on 05/16/2020. Patient need follow up asap

## 2020-06-07 ENCOUNTER — Telehealth: Payer: Self-pay

## 2020-06-07 NOTE — Telephone Encounter (Signed)
lvm attempted to schedule appt

## 2020-11-22 ENCOUNTER — Other Ambulatory Visit: Payer: Self-pay

## 2020-11-22 ENCOUNTER — Encounter: Payer: Self-pay | Admitting: Internal Medicine

## 2020-11-22 MED ORDER — PEN NEEDLES 32G X 4 MM MISC: 0 refills | Status: DC

## 2020-11-22 MED ORDER — TRESIBA FLEXTOUCH 200 UNIT/ML ~~LOC~~ SOPN: 62.0000 [IU] | PEN_INJECTOR | Freq: Every evening | SUBCUTANEOUS | 0 refills | Status: DC

## 2020-11-24 ENCOUNTER — Ambulatory Visit (INDEPENDENT_AMBULATORY_CARE_PROVIDER_SITE_OTHER): Payer: Managed Care, Other (non HMO) | Admitting: Nurse Practitioner

## 2020-11-24 ENCOUNTER — Encounter: Payer: Self-pay | Admitting: Internal Medicine

## 2020-11-24 ENCOUNTER — Encounter: Payer: Self-pay | Admitting: Nurse Practitioner

## 2020-11-24 ENCOUNTER — Other Ambulatory Visit: Payer: Self-pay

## 2020-11-24 VITALS — BP 130/74 | HR 70 | Temp 98.6°F | Ht 63.0 in | Wt 240.6 lb

## 2020-11-24 DIAGNOSIS — K5090 Crohn's disease, unspecified, without complications: Secondary | ICD-10-CM | POA: Diagnosis not present

## 2020-11-24 DIAGNOSIS — E1165 Type 2 diabetes mellitus with hyperglycemia: Secondary | ICD-10-CM | POA: Diagnosis not present

## 2020-11-24 DIAGNOSIS — L03119 Cellulitis of unspecified part of limb: Secondary | ICD-10-CM

## 2020-11-24 DIAGNOSIS — E7801 Familial hypercholesterolemia: Secondary | ICD-10-CM

## 2020-11-24 DIAGNOSIS — E559 Vitamin D deficiency, unspecified: Secondary | ICD-10-CM

## 2020-11-24 DIAGNOSIS — Z6841 Body Mass Index (BMI) 40.0 and over, adult: Secondary | ICD-10-CM

## 2020-11-24 LAB — POCT URINALYSIS DIPSTICK
Bilirubin, UA: NEGATIVE
Blood, UA: NEGATIVE
Glucose, UA: POSITIVE — AB
Ketones, UA: NEGATIVE
Leukocytes, UA: NEGATIVE
Nitrite, UA: NEGATIVE
Protein, UA: POSITIVE — AB
Spec Grav, UA: >=1.03 — AB (ref 1.010–1.025)
Urobilinogen, UA: 0.2 E.U./dL
pH, UA: 6.5 (ref 5.0–8.0)

## 2020-11-24 LAB — POCT UA - MICROALBUMIN
Albumin/Creatinine Ratio, Urine, POC: 300
Creatinine, POC: 300 mg/dL
Microalbumin Ur, POC: 150 mg/L

## 2020-11-24 MED ORDER — TRESIBA FLEXTOUCH 200 UNIT/ML ~~LOC~~ SOPN: 62.0000 [IU] | PEN_INJECTOR | Freq: Every evening | SUBCUTANEOUS | 1 refills | Status: DC

## 2020-11-24 MED ORDER — CEPHALEXIN 500 MG PO CAPS: 500.0000 mg | ORAL_CAPSULE | Freq: Four times a day (QID) | ORAL | 0 refills | Status: AC

## 2020-11-24 NOTE — Progress Notes (Signed)
I,Tianna Badgett,acting as a Education administrator for [employer], NP.,have documented all relevant documentation on the behalf of [employer], NP,as directed by  Bary Castilla, NP while in the presence of Bary Castilla, NP.  This visit occurred during the SARS-CoV-2 public health emergency.  Safety protocols were in place, including screening questions prior to the visit, additional usage of staff PPE, and extensive cleaning of exam room while observing appropriate contact time as indicated for disinfecting solutions.  Subjective:     Patient ID: Connie Williamson , female    DOB: 05-02-1996 , 25 y.o.   MRN: [id number]   Chief Complaint  Patient presents with  . Diabetes    HPI  Paitent is here for dm check. She has concerns with area on her legs. There are scattered brusing areas. When pressing in, they hurt. She noticed them couple of days ago.  Diet: She is not watching her diet. She eats whatever she wants.  Exercise: She is not getting any exercise done.  She does not check her blood sugar at home. She is not compliant with her diet and exercise. She does take her triseba.     Diabetes She presents for her initial diabetic visit. No MedicAlert identification noted. The initial diagnosis of diabetes was made 15 years ago. Her disease course has been worsening. There are no hypoglycemic associated symptoms. Associated symptoms include polydipsia and polyuria. Pertinent negatives for diabetes include no chest pain. There are no hypoglycemic complications. Risk factors for coronary artery disease include diabetes mellitus, obesity and sedentary lifestyle. Current diabetic treatment includes diet and insulin injections. Her breakfast blood glucose is taken between 9-10 am. Her breakfast blood glucose range is generally 140-180 mg/dl.     Past Medical History:  Diagnosis Date  . Asthma   . Crohn's disease ([place])   . Diabetes mellitus      Family History  Problem Relation Age  of Onset  . Diabetes Mother   . Kidney failure Mother   . Hypertension Mother   . Healthy Father   . Healthy Sister   . Healthy Brother   . Diabetes Maternal Grandmother   . Hypertension Maternal Grandmother   . Diabetes Maternal Grandfather   . Hypertension Maternal Grandfather      Current Outpatient Medications:  .  ibuprofen (ADVIL,MOTRIN) 200 MG tablet, Take 400 mg by mouth 2 (two) times daily as needed (pain)., Disp: , Rfl:  .  inFLIXimab (REMICADE) 100 MG injection, Inject into the vein., Disp: , Rfl:  .  Insulin Pen Needle (PEN NEEDLES) 32G X 4 MM MISC, Use as directed with insulin pens, Disp: 100 each, Rfl: 0 .  ondansetron (ZOFRAN ODT) 4 MG disintegrating tablet, Take 1 tablet (4 mg total) by mouth every 8 (eight) hours as needed for nausea., Disp: 10 tablet, Rfl: 0 .  cephALEXin (KEFLEX) 500 MG capsule, Take 1 capsule (500 mg total) by mouth 4 (four) times daily for 10 days., Disp: 40 capsule, Rfl: 0 .  insulin degludec (TRESIBA FLEXTOUCH) 200 UNIT/ML FlexTouch Pen, Inject 62 Units into the skin at bedtime., Disp: 9 mL, Rfl: 1   No Known Allergies   Review of Systems  Constitutional: Negative for chills and fever.  HENT: Negative for congestion and facial swelling.   Respiratory: Negative for cough, shortness of breath and wheezing.   Cardiovascular: Negative for chest pain and palpitations.  Gastrointestinal: Negative for constipation and diarrhea.  Endocrine: Positive for polydipsia and polyuria.  Musculoskeletal: Negative for arthralgias and myalgias.  Skin: Positive for rash.     Today's Vitals   11/24/20 1556  BP: 130/74  Pulse: 70  Temp: 98.6 F (37 C)  TempSrc: Oral  Weight: 240 lb 9.6 oz (109.1 kg)  Height: 5' 3"  (1.6 m)   Body mass index is 42.62 kg/m.  Wt Readings from Last 3 Encounters:  11/24/20 240 lb 9.6 oz (109.1 kg)  02/03/20 (!) 224 lb (101.6 kg)  12/30/19 217 lb 6.4 oz (98.6 kg)    Objective:  Physical Exam Constitutional:       Appearance: Normal appearance. She is obese.  Cardiovascular:     Rate and Rhythm: Normal rate and regular rhythm.     Pulses: Normal pulses.     Heart sounds: Normal heart sounds.  Pulmonary:     Effort: Pulmonary effort is normal. No respiratory distress.     Breath sounds: No wheezing.  Musculoskeletal:     Cervical back: Normal range of motion and neck supple.  Feet:     Right foot:     Protective Sensation: 3 sites tested.     Skin integrity: Skin integrity normal.     Left foot:     Protective Sensation: 3 sites tested.     Skin integrity: Skin integrity normal.     Comments: Swelling in left foot.  Skin:    General: Skin is warm and dry.     Capillary Refill: Capillary refill takes less than 2 seconds.     Findings: Bruising and rash present.     Comments: Scattered bruising. Nonpurulent cellulitis   Neurological:     General: No focal deficit present.     Mental Status: She is alert. Mental status is at baseline. She is disoriented.  Psychiatric:        Mood and Affect: Mood normal.        Behavior: Behavior normal.         Assessment And Plan:     1. Uncontrolled type 2 diabetes mellitus with hyperglycemia ([place]) --Discussed with patient the importance of glycemic control and long term complications from uncontrolled diabetes. Discussed with the patient the importance of compliance with home glucose monitoring, diet which includes decrease amount of sugary drinks and foods. Importance of exercise was also discussed with the patient. Importance of eye exams, self foot care and compliance to office visits was also discussed with the patient.  -Will check her Hgb A1c and she may need to be switched to ozempic.  Tyler Aas refilled today.  - Hemoglobin A1c - CMP14+EGFR - Ambulatory referral to Nutrition and Diabetic Education  2. Cellulitis of lower extremity, unspecified laterality -cellulitis of bilateral extremities with scattered bruising.  - CBC with  Diff -Instructed patient if the cellulitis does not get better after 24-48 hours of taking antx, she develops fever to follow up or go the emergency room.  -Instructed patient to elevate her legs.  - cephALEXin (KEFLEX) 500 MG capsule; Take 1 capsule (500 mg total) by mouth 4 (four) times daily for 10 days.  Dispense: 40 capsule; Refill: 0  3. Vitamin D deficiency -Will check and supplement if needed. Advised patient to spend atleast 15 min. Daily in sunlight.  - Vitamin D (25 hydroxy)  4. Familial hypercholesteremia -Encouraged to continue with a low fat diet avoiding fast food and fried food.  - Lipid panel  5. Crohn's disease without complication, unspecified gastrointestinal tract location [facility]) - Ambulatory referral to Gastroenterology  6. Class 3 severe obesity due to excess calories  with serious comorbidity and body mass index (BMI) of 40.0 to 44.9 in adult [facility]) Advised patient on a healthy diet including avoiding fast food and red meats. Increase the intake of lean meats including grilled chicken and [place].  Drink a lot of water. Decrease intake of fatty foods. Exercise for 30-45 min. 4-5 a week to decrease the risk of cardiac event.   Will consider follow up or referral to CCM in the future.   Follow up: 24-48 hours if patient is not improving or the symptoms are getting worse.   Side effects and appropriate use of all the medication(s) were discussed with the patient today. Patient advised to use the medication(s) as directed by their healthcare provider. The patient was encouraged to read, review, and understand all associated package inserts and contact our office with any questions or concerns. The patient accepts the risks of the treatment plan and had an opportunity to ask questions.   The patient was encouraged to call or send a message through [place] for any questions or concerns.   Patient was given opportunity to ask questions. Patient verbalized understanding of the  plan and was able to repeat key elements of the plan. All questions were answered to their satisfaction.  Raman Ellenore Roscoe, DNP   I, Raman Janielle Mittelstadt have reviewed all documentation for this visit. The documentation on 11/24/20 for the exam, diagnosis, procedures, and orders are all accurate and complete.    IF YOU HAVE BEEN REFERRED TO A SPECIALIST, IT MAY TAKE 1-2 WEEKS TO SCHEDULE/PROCESS THE REFERRAL. IF YOU HAVE NOT HEARD FROM US/SPECIALIST IN TWO WEEKS, PLEASE GIVE [place] A CALL AT [phone].   THE PATIENT IS ENCOURAGED TO PRACTICE SOCIAL DISTANCING DUE TO THE COVID-19 PANDEMIC.

## 2020-11-24 NOTE — Patient Instructions (Signed)
Diabetes Mellitus and Exercise Exercising regularly is important for overall health, especially for people who have diabetes mellitus. Exercising is not only about losing weight. It has many other health benefits, such as increasing muscle strength and bone density and reducing body fat and stress. This leads to improved fitness, flexibility, and endurance, all of which result in better overall health. What are the benefits of exercise if I have diabetes? Exercise has many benefits for people with diabetes. They include:  Helping to lower and control blood sugar (glucose).  Helping the body to respond better to the hormone insulin by improving insulin sensitivity.  Reducing how much insulin the body needs.  Lowering the risk for heart disease by: ? Lowering "bad" cholesterol and triglyceride levels. ? Increasing "good" cholesterol levels. ? Lowering blood pressure. ? Lowering blood glucose levels. What is my activity plan? Your health care provider or certified diabetes educator can help you make a plan for the type and frequency of exercise that works for you. This is called your activity plan. Be sure to:  Get at least 150 minutes of medium-intensity or high-intensity exercise each week. Exercises may include brisk walking, biking, or water aerobics.  Do stretching and strengthening exercises, such as yoga or weight lifting, at least 2 times a week.  Spread out your activity over at least 3 days of the week.  Get some form of physical activity each day. ? Do not go more than 2 days in a row without some kind of physical activity. ? Avoid being inactive for more than 90 minutes at a time. Take frequent breaks to walk or stretch.  Choose exercises or activities that you enjoy. Set realistic goals.  Start slowly and gradually increase your exercise intensity over time.   How do I manage my diabetes during exercise? Monitor your blood glucose  Check your blood glucose before and  after exercising. If your blood glucose is: ? 240 mg/dL (13.3 mmol/L) or higher before you exercise, check your urine for ketones. These are chemicals created by the liver. If you have ketones in your urine, do not exercise until your blood glucose returns to normal. ? 100 mg/dL (5.6 mmol/L) or lower, eat a snack containing 15-20 grams of carbohydrate. Check your blood glucose 15 minutes after the snack to make sure that your glucose level is above 100 mg/dL (5.6 mmol/L) before you start your exercise.  Know the symptoms of low blood glucose (hypoglycemia) and how to treat it. Your risk for hypoglycemia increases during and after exercise. Follow these tips and your health care provider's instructions  Keep a carbohydrate snack that is fast-acting for use before, during, and after exercise to help prevent or treat hypoglycemia.  Avoid injecting insulin into areas of the body that are going to be exercised. For example, avoid injecting insulin into: ? Your arms, when you are about to play tennis. ? Your legs, when you are about to go jogging.  Keep records of your exercise habits. Doing this can help you and your health care provider adjust your diabetes management plan as needed. Write down: ? Food that you eat before and after you exercise. ? Blood glucose levels before and after you exercise. ? The type and amount of exercise you have done.  Work with your health care provider when you start a new exercise or activity. He or she may need to: ? Make sure that the activity is safe for you. ? Adjust your insulin, other medicines, and food that   you eat.  Drink plenty of water while you exercise. This prevents loss of water (dehydration) and problems caused by a lot of heat in the body (heat stroke).   Where to find more information  American Diabetes Association: www.diabetes.org Summary  Exercising regularly is important for overall health, especially for people who have diabetes  mellitus.  Exercising has many health benefits. It increases muscle strength and bone density and reduces body fat and stress. It also lowers and controls blood glucose.  Your health care provider or certified diabetes educator can help you make an activity plan for the type and frequency of exercise that works for you.  Work with your health care provider to make sure any new activity is safe for you. Also work with your health care provider to adjust your insulin, other medicines, and the food you eat. This information is not intended to replace advice given to you by your health care provider. Make sure you discuss any questions you have with your health care provider. Document Revised: 03/23/2019 Document Reviewed: 03/23/2019 Elsevier Patient Education  2021 Elsevier Inc.  

## 2020-11-25 LAB — CBC WITH DIFFERENTIAL/PLATELET
Basophils Absolute: 0 10*3/uL (ref 0.0–0.2)
Basos: 0 %
EOS (ABSOLUTE): 0.2 10*3/uL (ref 0.0–0.4)
Eos: 2 %
Hematocrit: 41.1 % (ref 34.0–46.6)
Hemoglobin: 12.9 g/dL (ref 11.1–15.9)
Immature Grans (Abs): 0 10*3/uL (ref 0.0–0.1)
Immature Granulocytes: 0 %
Lymphocytes Absolute: 2 10*3/uL (ref 0.7–3.1)
Lymphs: 21 %
MCH: 25.6 pg — ABNORMAL LOW (ref 26.6–33.0)
MCHC: 31.4 g/dL — ABNORMAL LOW (ref 31.5–35.7)
MCV: 82 fL (ref 79–97)
Monocytes Absolute: 0.7 10*3/uL (ref 0.1–0.9)
Monocytes: 8 %
Neutrophils Absolute: 6.4 10*3/uL (ref 1.4–7.0)
Neutrophils: 69 %
Platelets: 422 10*3/uL (ref 150–450)
RBC: 5.03 x10E6/uL (ref 3.77–5.28)
RDW: 13.3 % (ref 11.7–15.4)
WBC: 9.4 10*3/uL (ref 3.4–10.8)

## 2020-11-25 LAB — CMP14+EGFR
ALT: 16 IU/L (ref 0–32)
AST: 15 IU/L (ref 0–40)
Albumin/Globulin Ratio: 1.3 (ref 1.2–2.2)
Albumin: 4.3 g/dL (ref 3.9–5.0)
Alkaline Phosphatase: 139 IU/L — ABNORMAL HIGH (ref 44–121)
BUN/Creatinine Ratio: 10 (ref 9–23)
BUN: 7 mg/dL (ref 6–20)
Bilirubin Total: 0.3 mg/dL (ref 0.0–1.2)
CO2: 20 mmol/L (ref 20–29)
Calcium: 9.4 mg/dL (ref 8.7–10.2)
Chloride: 102 mmol/L (ref 96–106)
Creatinine, Ser: 0.68 mg/dL (ref 0.57–1.00)
Globulin, Total: 3.2 g/dL (ref 1.5–4.5)
Glucose: 292 mg/dL — ABNORMAL HIGH (ref 65–99)
Potassium: 4.4 mmol/L (ref 3.5–5.2)
Sodium: 138 mmol/L (ref 134–144)
Total Protein: 7.5 g/dL (ref 6.0–8.5)
eGFR: 123 mL/min/{1.73_m2} (ref >59)

## 2020-11-25 LAB — LIPID PANEL
Chol/HDL Ratio: 3.7 ratio (ref 0.0–4.4)
Cholesterol, Total: 209 mg/dL — ABNORMAL HIGH (ref 100–199)
HDL: 57 mg/dL (ref >39)
LDL Chol Calc (NIH): 131 mg/dL — ABNORMAL HIGH (ref 0–99)
Triglycerides: 121 mg/dL (ref 0–149)
VLDL Cholesterol Cal: 21 mg/dL (ref 5–40)

## 2020-11-25 LAB — HEMOGLOBIN A1C
Est. average glucose Bld gHb Est-mCnc: 292 mg/dL
Hgb A1c MFr Bld: 11.8 % — ABNORMAL HIGH (ref 4.8–5.6)

## 2020-11-25 LAB — VITAMIN D 25 HYDROXY (VIT D DEFICIENCY, FRACTURES): Vit D, 25-Hydroxy: 16.4 ng/mL — ABNORMAL LOW (ref 30.0–100.0)

## 2020-11-28 ENCOUNTER — Other Ambulatory Visit: Payer: Self-pay | Admitting: Nurse Practitioner

## 2020-11-28 DIAGNOSIS — E559 Vitamin D deficiency, unspecified: Secondary | ICD-10-CM

## 2020-11-28 MED ORDER — VITAMIN D3 1.25 MG (50000 UT) PO CAPS: ORAL_CAPSULE | ORAL | 0 refills | Status: DC

## 2020-11-29 ENCOUNTER — Other Ambulatory Visit: Payer: Self-pay | Admitting: Nurse Practitioner

## 2021-01-19 ENCOUNTER — Ambulatory Visit: Payer: Self-pay | Admitting: Internal Medicine

## 2021-02-02 ENCOUNTER — Encounter: Payer: Self-pay | Admitting: Nurse Practitioner

## 2021-03-09 ENCOUNTER — Encounter: Payer: Self-pay | Admitting: Nurse Practitioner

## 2022-03-04 ENCOUNTER — Inpatient Hospital Stay (HOSPITAL_COMMUNITY)
Admission: EM | Admit: 2022-03-04 | Discharge: 2022-03-07 | DRG: 872 | Disposition: A | Discharge status: Home / Self Care | Payer: BC Managed Care – PPO | Attending: Family Medicine | Admitting: Family Medicine

## 2022-03-04 ENCOUNTER — Emergency Department (HOSPITAL_COMMUNITY): Payer: BC Managed Care – PPO

## 2022-03-04 ENCOUNTER — Other Ambulatory Visit: Payer: Self-pay

## 2022-03-04 ENCOUNTER — Encounter (HOSPITAL_COMMUNITY): Payer: Self-pay | Admitting: Emergency Medicine

## 2022-03-04 DIAGNOSIS — K5090 Crohn's disease, unspecified, without complications: Secondary | ICD-10-CM | POA: Diagnosis present

## 2022-03-04 DIAGNOSIS — A419 Sepsis, unspecified organism: Principal | ICD-10-CM | POA: Diagnosis present

## 2022-03-04 DIAGNOSIS — E8809 Other disorders of plasma-protein metabolism, not elsewhere classified: Secondary | ICD-10-CM | POA: Diagnosis present

## 2022-03-04 DIAGNOSIS — L03115 Cellulitis of right lower limb: Secondary | ICD-10-CM | POA: Diagnosis present

## 2022-03-04 DIAGNOSIS — J45909 Unspecified asthma, uncomplicated: Secondary | ICD-10-CM | POA: Diagnosis present

## 2022-03-04 DIAGNOSIS — Z833 Family history of diabetes mellitus: Secondary | ICD-10-CM

## 2022-03-04 DIAGNOSIS — Z79899 Other long term (current) drug therapy: Secondary | ICD-10-CM

## 2022-03-04 DIAGNOSIS — E1165 Type 2 diabetes mellitus with hyperglycemia: Secondary | ICD-10-CM

## 2022-03-04 DIAGNOSIS — E119 Type 2 diabetes mellitus without complications: Secondary | ICD-10-CM | POA: Diagnosis present

## 2022-03-04 DIAGNOSIS — L02415 Cutaneous abscess of right lower limb: Secondary | ICD-10-CM | POA: Diagnosis present

## 2022-03-04 DIAGNOSIS — E669 Obesity, unspecified: Secondary | ICD-10-CM | POA: Diagnosis present

## 2022-03-04 DIAGNOSIS — Z6835 Body mass index (BMI) 35.0-35.9, adult: Secondary | ICD-10-CM

## 2022-03-04 DIAGNOSIS — D649 Anemia, unspecified: Secondary | ICD-10-CM | POA: Diagnosis present

## 2022-03-04 DIAGNOSIS — Z794 Long term (current) use of insulin: Secondary | ICD-10-CM

## 2022-03-04 DIAGNOSIS — Z7984 Long term (current) use of oral hypoglycemic drugs: Secondary | ICD-10-CM

## 2022-03-04 DIAGNOSIS — K5010 Crohn's disease of large intestine without complications: Secondary | ICD-10-CM | POA: Diagnosis present

## 2022-03-04 LAB — CBC WITH DIFFERENTIAL/PLATELET
Abs Immature Granulocytes: 0.13 10*3/uL — ABNORMAL HIGH (ref 0.00–0.07)
Basophils Absolute: 0.1 10*3/uL (ref 0.0–0.1)
Basophils Relative: 0 %
Eosinophils Absolute: 0.4 10*3/uL (ref 0.0–0.5)
Eosinophils Relative: 3 %
HCT: 33.6 % — ABNORMAL LOW (ref 36.0–46.0)
Hemoglobin: 10.2 g/dL — ABNORMAL LOW (ref 12.0–15.0)
Immature Granulocytes: 1 %
Lymphocytes Relative: 17 %
Lymphs Abs: 2.5 10*3/uL (ref 0.7–4.0)
MCH: 26 pg (ref 26.0–34.0)
MCHC: 30.4 g/dL (ref 30.0–36.0)
MCV: 85.7 fL (ref 80.0–100.0)
Monocytes Absolute: 1 10*3/uL (ref 0.1–1.0)
Monocytes Relative: 7 %
Neutro Abs: 10.9 10*3/uL — ABNORMAL HIGH (ref 1.7–7.7)
Neutrophils Relative %: 72 %
Platelets: 661 10*3/uL — ABNORMAL HIGH (ref 150–400)
RBC: 3.92 MIL/uL (ref 3.87–5.11)
RDW: 15.6 % — ABNORMAL HIGH (ref 11.5–15.5)
WBC: 14.9 10*3/uL — ABNORMAL HIGH (ref 4.0–10.5)
nRBC: 0 % (ref 0.0–0.2)

## 2022-03-04 LAB — COMPREHENSIVE METABOLIC PANEL
ALT: 8 U/L (ref 0–44)
AST: 16 U/L (ref 15–41)
Albumin: 2.8 g/dL — ABNORMAL LOW (ref 3.5–5.0)
Alkaline Phosphatase: 99 U/L (ref 38–126)
Anion gap: 10 (ref 5–15)
BUN: 6 mg/dL (ref 6–20)
CO2: 26 mmol/L (ref 22–32)
Calcium: 8.6 mg/dL — ABNORMAL LOW (ref 8.9–10.3)
Chloride: 99 mmol/L (ref 98–111)
Creatinine, Ser: 0.71 mg/dL (ref 0.44–1.00)
GFR, Estimated: >60 mL/min (ref >60)
Glucose, Bld: 198 mg/dL — ABNORMAL HIGH (ref 70–99)
Potassium: 4.2 mmol/L (ref 3.5–5.1)
Sodium: 135 mmol/L (ref 135–145)
Total Bilirubin: 0.5 mg/dL (ref 0.3–1.2)
Total Protein: 7.2 g/dL (ref 6.5–8.1)

## 2022-03-04 LAB — I-STAT CHEM 8, ED
BUN: 6 mg/dL (ref 6–20)
BUN: 6 mg/dL (ref 6–20)
Calcium, Ion: 1.11 mmol/L — ABNORMAL LOW (ref 1.15–1.40)
Calcium, Ion: 1.1 mmol/L — ABNORMAL LOW (ref 1.15–1.40)
Chloride: 98 mmol/L (ref 98–111)
Chloride: 98 mmol/L (ref 98–111)
Creatinine, Ser: 0.6 mg/dL (ref 0.44–1.00)
Creatinine, Ser: 0.6 mg/dL (ref 0.44–1.00)
Glucose, Bld: 197 mg/dL — ABNORMAL HIGH (ref 70–99)
Glucose, Bld: 191 mg/dL — ABNORMAL HIGH (ref 70–99)
HCT: 34 % — ABNORMAL LOW (ref 36.0–46.0)
HCT: 39 % (ref 36.0–46.0)
Hemoglobin: 11.6 g/dL — ABNORMAL LOW (ref 12.0–15.0)
Hemoglobin: 13.3 g/dL (ref 12.0–15.0)
Potassium: 4.2 mmol/L (ref 3.5–5.1)
Potassium: 4.2 mmol/L (ref 3.5–5.1)
Sodium: 136 mmol/L (ref 135–145)
Sodium: 136 mmol/L (ref 135–145)
TCO2: 30 mmol/L (ref 22–32)
TCO2: 30 mmol/L (ref 22–32)

## 2022-03-04 LAB — I-STAT BETA HCG BLOOD, ED (MC, WL, AP ONLY)
I-stat hCG, quantitative: <5 m[IU]/mL (ref <5)
I-stat hCG, quantitative: <5 m[IU]/mL (ref <5)

## 2022-03-04 LAB — CBG MONITORING, ED: Glucose-Capillary: 174 mg/dL — ABNORMAL HIGH (ref 70–99)

## 2022-03-04 MED ORDER — IOHEXOL 300 MG/ML  SOLN
100.0000 mL | Freq: Once | INTRAMUSCULAR | Status: AC | PRN
  Administered 2022-03-04: 100 mL via INTRAVENOUS

## 2022-03-04 NOTE — ED Provider Triage Note (Signed)
Emergency Medicine Provider Triage Evaluation Note  Connie Williamson , a 26 y.o. female  was evaluated in triage.  Pt complains of abscess. Patient states that she has had abscess growing in right groin for over a week. She went to urgent care on Friday and had I&D performed. States she went back today to have the packing removed and they were concerned so sent her here. She has purulent drainage coming from her right groin with surrounding swelling, redness and pain. .  Review of Systems  Positive: See above Negative:   Physical Exam  BP 119/76 (BP Location: Right Arm)   Pulse (!) 104   Temp 98.4 F (36.9 C) (Oral)   Resp 12   SpO2 99%  Gen:   Awake, no distress   Resp:  Normal effort  MSK:   Moves extremities without difficulty  Other:  Chaperone present. Large area of swelling, redness and induration to the right upper thigh and groin. Purulent drainage coming from a wound on the inner right thigh/groin   Medical Decision Making  Medically screening exam initiated at 4:06 PM.  Appropriate orders placed.  Connie Williamson was informed that the remainder of the evaluation will be completed by another provider, this initial triage assessment does not replace that evaluation, and the importance of remaining in the ED until their evaluation is complete.     Cristopher Peru, PA-C 03/04/22 [phone]

## 2022-03-04 NOTE — ED Triage Notes (Signed)
Pt reports infected cyst to the right thigh/groin area that has gotten worse over the past week. Pt had and I&D Friday and had packing removed today with purulent drainage. Urgent care sent pt over for concern for infection and having a scan. Pt had a fever Friday. Hx of diabetes. Pt has been taking hydrocodone for pain relief.

## 2022-03-05 ENCOUNTER — Encounter (HOSPITAL_COMMUNITY): Payer: Self-pay | Admitting: Internal Medicine

## 2022-03-05 DIAGNOSIS — L02415 Cutaneous abscess of right lower limb: Secondary | ICD-10-CM | POA: Diagnosis present

## 2022-03-05 DIAGNOSIS — D649 Anemia, unspecified: Secondary | ICD-10-CM | POA: Diagnosis present

## 2022-03-05 DIAGNOSIS — Z794 Long term (current) use of insulin: Secondary | ICD-10-CM

## 2022-03-05 DIAGNOSIS — J45909 Unspecified asthma, uncomplicated: Secondary | ICD-10-CM | POA: Diagnosis present

## 2022-03-05 DIAGNOSIS — K5010 Crohn's disease of large intestine without complications: Secondary | ICD-10-CM | POA: Diagnosis present

## 2022-03-05 DIAGNOSIS — Z79899 Other long term (current) drug therapy: Secondary | ICD-10-CM | POA: Diagnosis not present

## 2022-03-05 DIAGNOSIS — E8809 Other disorders of plasma-protein metabolism, not elsewhere classified: Secondary | ICD-10-CM | POA: Diagnosis present

## 2022-03-05 DIAGNOSIS — E669 Obesity, unspecified: Secondary | ICD-10-CM | POA: Diagnosis present

## 2022-03-05 DIAGNOSIS — A419 Sepsis, unspecified organism: Secondary | ICD-10-CM | POA: Diagnosis present

## 2022-03-05 DIAGNOSIS — K5000 Crohn's disease of small intestine without complications: Secondary | ICD-10-CM

## 2022-03-05 DIAGNOSIS — L03115 Cellulitis of right lower limb: Principal | ICD-10-CM | POA: Diagnosis present

## 2022-03-05 DIAGNOSIS — Z6835 Body mass index (BMI) 35.0-35.9, adult: Secondary | ICD-10-CM | POA: Diagnosis not present

## 2022-03-05 DIAGNOSIS — K5090 Crohn's disease, unspecified, without complications: Secondary | ICD-10-CM | POA: Diagnosis present

## 2022-03-05 DIAGNOSIS — Z7984 Long term (current) use of oral hypoglycemic drugs: Secondary | ICD-10-CM | POA: Diagnosis not present

## 2022-03-05 DIAGNOSIS — Z833 Family history of diabetes mellitus: Secondary | ICD-10-CM | POA: Diagnosis not present

## 2022-03-05 DIAGNOSIS — E119 Type 2 diabetes mellitus without complications: Secondary | ICD-10-CM | POA: Diagnosis present

## 2022-03-05 LAB — COMPREHENSIVE METABOLIC PANEL
ALT: 9 U/L (ref 0–44)
AST: 7 U/L — ABNORMAL LOW (ref 15–41)
Albumin: 2.4 g/dL — ABNORMAL LOW (ref 3.5–5.0)
Alkaline Phosphatase: 86 U/L (ref 38–126)
Anion gap: 9 (ref 5–15)
BUN: <5 mg/dL — ABNORMAL LOW (ref 6–20)
CO2: 24 mmol/L (ref 22–32)
Calcium: 8.2 mg/dL — ABNORMAL LOW (ref 8.9–10.3)
Chloride: 106 mmol/L (ref 98–111)
Creatinine, Ser: 0.73 mg/dL (ref 0.44–1.00)
GFR, Estimated: >60 mL/min (ref >60)
Glucose, Bld: 138 mg/dL — ABNORMAL HIGH (ref 70–99)
Potassium: 3.8 mmol/L (ref 3.5–5.1)
Sodium: 139 mmol/L (ref 135–145)
Total Bilirubin: 0.5 mg/dL (ref 0.3–1.2)
Total Protein: 6.6 g/dL (ref 6.5–8.1)

## 2022-03-05 LAB — CBC WITH DIFFERENTIAL/PLATELET
Abs Immature Granulocytes: 0.1 10*3/uL — ABNORMAL HIGH (ref 0.00–0.07)
Basophils Absolute: 0.1 10*3/uL (ref 0.0–0.1)
Basophils Relative: 0 %
Eosinophils Absolute: 0.4 10*3/uL (ref 0.0–0.5)
Eosinophils Relative: 3 %
HCT: 30.4 % — ABNORMAL LOW (ref 36.0–46.0)
Hemoglobin: 9.1 g/dL — ABNORMAL LOW (ref 12.0–15.0)
Immature Granulocytes: 1 %
Lymphocytes Relative: 16 %
Lymphs Abs: 1.9 10*3/uL (ref 0.7–4.0)
MCH: 26.1 pg (ref 26.0–34.0)
MCHC: 29.9 g/dL — ABNORMAL LOW (ref 30.0–36.0)
MCV: 87.4 fL (ref 80.0–100.0)
Monocytes Absolute: 1 10*3/uL (ref 0.1–1.0)
Monocytes Relative: 8 %
Neutro Abs: 8.9 10*3/uL — ABNORMAL HIGH (ref 1.7–7.7)
Neutrophils Relative %: 72 %
Platelets: 579 10*3/uL — ABNORMAL HIGH (ref 150–400)
RBC: 3.48 MIL/uL — ABNORMAL LOW (ref 3.87–5.11)
RDW: 15.6 % — ABNORMAL HIGH (ref 11.5–15.5)
WBC: 12.3 10*3/uL — ABNORMAL HIGH (ref 4.0–10.5)
nRBC: 0 % (ref 0.0–0.2)

## 2022-03-05 LAB — PROTIME-INR
INR: 1.2 (ref 0.8–1.2)
Prothrombin Time: 14.6 seconds (ref 11.4–15.2)

## 2022-03-05 LAB — URINALYSIS, COMPLETE (UACMP) WITH MICROSCOPIC
Bacteria, UA: NONE SEEN
Bilirubin Urine: NEGATIVE
Glucose, UA: NEGATIVE mg/dL
Ketones, ur: 20 mg/dL — AB
Nitrite: NEGATIVE
Protein, ur: NEGATIVE mg/dL
Specific Gravity, Urine: 1.025 (ref 1.005–1.030)
pH: 6 (ref 5.0–8.0)

## 2022-03-05 LAB — MAGNESIUM: Magnesium: 1.8 mg/dL (ref 1.7–2.4)

## 2022-03-05 LAB — FERRITIN: Ferritin: 133 ng/mL (ref 11–307)

## 2022-03-05 LAB — CBG MONITORING, ED
Glucose-Capillary: 85 mg/dL (ref 70–99)
Glucose-Capillary: 177 mg/dL — ABNORMAL HIGH (ref 70–99)

## 2022-03-05 LAB — IRON AND TIBC
Iron: 31 ug/dL (ref 28–170)
Saturation Ratios: 16 % (ref 10.4–31.8)
TIBC: 197 ug/dL — ABNORMAL LOW (ref 250–450)
UIBC: 166 ug/dL

## 2022-03-05 LAB — GLUCOSE, CAPILLARY: Glucose-Capillary: 204 mg/dL — ABNORMAL HIGH (ref 70–99)

## 2022-03-05 LAB — HEMOGLOBIN A1C
Hgb A1c MFr Bld: 10.7 % — ABNORMAL HIGH (ref 4.8–5.6)
Mean Plasma Glucose: 260.39 mg/dL

## 2022-03-05 LAB — LACTIC ACID, PLASMA: Lactic Acid, Venous: 0.8 mmol/L (ref 0.5–1.9)

## 2022-03-05 MED ORDER — FENTANYL CITRATE PF 50 MCG/ML IJ SOSY
50.0000 ug | PREFILLED_SYRINGE | Freq: Once | INTRAMUSCULAR | Status: AC
  Administered 2022-03-05: 50 ug via INTRAVENOUS
  Filled 2022-03-05: qty 1

## 2022-03-05 MED ORDER — VANCOMYCIN HCL 750 MG/150ML IV SOLN
750.0000 mg | Freq: Two times a day (BID) | INTRAVENOUS | Status: DC
  Administered 2022-03-05 – 2022-03-06 (×4): 750 mg via INTRAVENOUS
  Filled 2022-03-05 (×5): qty 150

## 2022-03-05 MED ORDER — INSULIN GLARGINE-YFGN 100 UNIT/ML ~~LOC~~ SOLN
20.0000 [IU] | Freq: Every day | SUBCUTANEOUS | Status: DC
Start: 2022-03-05 — End: 2022-03-07
  Administered 2022-03-05 – 2022-03-06 (×2): 20 [IU] via SUBCUTANEOUS
  Filled 2022-03-05 (×3): qty 0.2

## 2022-03-05 MED ORDER — ONDANSETRON HCL 4 MG/2ML IJ SOLN
4.0000 mg | Freq: Once | INTRAMUSCULAR | Status: AC
  Administered 2022-03-05: 4 mg via INTRAVENOUS
  Filled 2022-03-05: qty 2

## 2022-03-05 MED ORDER — INSULIN ASPART 100 UNIT/ML IJ SOLN
0.0000 [IU] | Freq: Three times a day (TID) | INTRAMUSCULAR | Status: DC
  Administered 2022-03-05 (×2): 2 [IU] via SUBCUTANEOUS
  Administered 2022-03-06: 1 [IU] via SUBCUTANEOUS
  Administered 2022-03-06: 2 [IU] via SUBCUTANEOUS
  Administered 2022-03-07: 1 [IU] via SUBCUTANEOUS
  Administered 2022-03-07: 2 [IU] via SUBCUTANEOUS

## 2022-03-05 MED ORDER — ACETAMINOPHEN 650 MG RE SUPP: 650.0000 mg | Freq: Four times a day (QID) | RECTAL | Status: DC | PRN

## 2022-03-05 MED ORDER — VANCOMYCIN HCL 1500 MG/300ML IV SOLN
1500.0000 mg | Freq: Once | INTRAVENOUS | Status: AC
  Administered 2022-03-05: 1500 mg via INTRAVENOUS
  Filled 2022-03-05: qty 300

## 2022-03-05 MED ORDER — ACETAMINOPHEN 325 MG PO TABS
650.0000 mg | ORAL_TABLET | Freq: Four times a day (QID) | ORAL | Status: DC | PRN
  Administered 2022-03-05: 650 mg via ORAL
  Filled 2022-03-05: qty 2

## 2022-03-05 MED ORDER — FENTANYL CITRATE PF 50 MCG/ML IJ SOSY
50.0000 ug | PREFILLED_SYRINGE | INTRAMUSCULAR | Status: DC | PRN
  Administered 2022-03-06 (×3): 50 ug via INTRAVENOUS
  Filled 2022-03-05 (×3): qty 1

## 2022-03-05 MED ORDER — NALOXONE HCL 0.4 MG/ML IJ SOLN: 0.4000 mg | INTRAMUSCULAR | Status: DC | PRN

## 2022-03-05 MED ORDER — SODIUM CHLORIDE 0.9 % IV BOLUS
1000.0000 mL | Freq: Once | INTRAVENOUS | Status: AC
Start: 2022-03-05 — End: 2022-03-05
  Administered 2022-03-05: 1000 mL via INTRAVENOUS

## 2022-03-05 MED ORDER — LIDOCAINE-EPINEPHRINE 2 %-1:200000 IJ SOLN
20.0000 mL | Freq: Once | INTRAMUSCULAR | Status: AC
Start: 2022-03-05 — End: 2022-03-05
  Administered 2022-03-05: 20 mL via INTRADERMAL
  Filled 2022-03-05: qty 20

## 2022-03-05 MED ORDER — SODIUM CHLORIDE 0.9 % IV SOLN: INTRAVENOUS | Status: AC

## 2022-03-05 NOTE — H&P (Signed)
History and Physical    PLEASE NOTE THAT DRAGON DICTATION SOFTWARE WAS USED IN THE CONSTRUCTION OF THIS NOTE.   Connie Williamson WJX:[id number] DOB: 02/24/1996 DOA: 03/04/2022  PCP: Glendale Chard, MD  Patient coming from: home   I have personally briefly reviewed patient's old medical records in [place]  Chief Complaint: Erythema of right lower extremity  HPI: Connie Williamson is a 26 y.o. female with medical history significant for type 2 diabetes mellitus, who is admitted to [facility] on 03/04/2022 with sepsis due to cellulitis of the right lower extremity after presenting from home to [facility] ED complaining of right lower extremity erythema.   In the setting of approximately 1 week of right lower extremity erythema about the medial aspect of the proximal right thigh and associated with swelling, increased tenderness, and purulent drainage, the patient was diagnosed with right lower extremity cellulitis at time of presentation to urgent care on Friday, 03/02/2022.  There is also noted to be the presence of an abscess at the time of that visit, prompting I&D of the lesion along with packing and initiation of clindamycin 300 mg p.o. 3 times daily.   The patient conveys good compliance with residual clindamycin, and followed up in urgent care, as instructed, on Sunday, 03/04/2022 for removal of packing material, at which time further progression of erythema, tenderness, increased warmth, swelling and purulent drainage was noted, prompting recommendation for the patient to present to local emergency department for further evaluation management thereof.  She denies any associated numbness or paresthesias involving the right lower extremity.  Not associated with any subjective fever, chills, rigors, or generalized myalgias.  Denies rash to any other location.  Of note, she has a history of type 2 diabetes mellitus, with most recent hemoglobin A1c noted to be 11.8% when checked in  May 2022.    ED Course:  Vital signs in the ED were notable for the following: Afebrile; heart rate 94-1 06; blood pressure 102/97; respiratory rate 16-20, oxygen saturation 96 to 100% on room air.   Labs were notable for the following: CMP notable for the following:  126, creatinine 0.71, glucose 198, calcium adjusted for mild hypoalbuminemia noted to be 9.5; albumin 2.8, otherwise liver enzymes within normal limits.  CBC notable for blood cell count 14,900 with 72% neutrophils, hemoglobin 10.2 associated normocytic/normochromic properties and compared to most recent prior hemoglobin of 12.9 in May 2022, platelet count 661.  Imaging and additional notable ED work-up: CT pelvis with contrast showed cellulitis in the medial right upper thigh with post procedural changes, but no evidence of drainable fluid collection or abscess, no evidence of subcutaneous gas CT pelvis also showed evidence of thick walled bladder.  While in the ED, the following were administered: IV vancomycin, fentanyl 50 mcg IV x1, Zofran 4 mg IV x1, normal saline x1 L bolus.  Subsequently, the patient was admitted for further evaluation and management of sepsis due to right lower extremity cellulitis in the setting of failure of outpatient oral antibiotics.     Review of Systems: As per HPI otherwise 10 point review of systems negative.   Past Medical History:  Diagnosis Date   Asthma    Crohn's disease ([place])    Diabetes mellitus     Past Surgical History:  Procedure Laterality Date   ADENOIDECTOMY     BOWEL RESECTION     TONSILLECTOMY      Social History:  reports that she has never smoked. She has  never used smokeless tobacco. She reports current alcohol use. She reports that she does not use drugs.   No Known Allergies  Family History  Problem Relation Age of Onset   Diabetes Mother    Kidney failure Mother    Hypertension Mother    Healthy Father    Healthy Sister    Healthy Brother    Diabetes  Maternal Grandmother    Hypertension Maternal Grandmother    Diabetes Maternal Grandfather    Hypertension Maternal Grandfather     Family history reviewed and not pertinent    Prior to Admission medications   Medication Sig Start Date End Date Taking? Authorizing Provider  cholecalciferol (VITAMIN D3) 25 MCG (1000 UNIT) tablet Take 1,000 Units by mouth daily.   Yes [provider]  HYDROcodone-acetaminophen (NORCO/VICODIN) 5-325 MG tablet Take 1 tablet by mouth every 4 (four) hours as needed for moderate pain or severe pain. 03/02/22  Yes [provider]  ibuprofen (ADVIL,MOTRIN) 200 MG tablet Take 400 mg by mouth 2 (two) times daily as needed (pain).   Yes [provider]  metFORMIN (GLUCOPHAGE) 500 MG tablet Take 500 mg by mouth daily. 02/13/22  Yes [provider]  TRESIBA 100 UNIT/ML SOLN Inject 30 Units into the skin at bedtime. 02/13/22  Yes [provider]  Cholecalciferol (VITAMIN D3) 1.25 MG (50000 UT) CAPS Take 1 capsule by mouth on Tuesday and Friday Patient not taking: Reported on 03/05/2022 11/28/20   Bary Castilla, NP  insulin degludec (TRESIBA FLEXTOUCH) 200 UNIT/ML FlexTouch Pen Inject 62 Units into the skin at bedtime. Patient not taking: Reported on 03/05/2022 11/24/20   Bary Castilla, NP  Insulin Pen Needle (PEN NEEDLES) 32G X 4 MM MISC Use as directed with insulin pens 11/22/20   Glendale Chard, MD  ondansetron (ZOFRAN ODT) 4 MG disintegrating tablet Take 1 tablet (4 mg total) by mouth every 8 (eight) hours as needed for nausea. Patient not taking: Reported on 03/05/2022 01/25/15   Baron Sane, PA-C     Objective    Physical Exam: Vitals:   03/05/22 0145 03/05/22 0330 03/05/22 0344 03/05/22 0515  BP: 99/62 102/68 102/77 101/67  Pulse: 95 94 94 94  Resp: _0 Temp:  98.5 F (36.9 C) 98.8 F (37.1 C)   TempSrc:  Oral Oral   SpO2: 100% 96% 100% 96%  Weight:      Height:        General: appears to  be stated age; alert, oriented Skin: warm, right medial thigh erythema with purulent drainage. Head:  AT/ Mouth:  Oral mucosa membranes appear dry, normal dentition Neck: supple; trachea midline Heart:  RRR; did not appreciate any M/R/G Lungs: CTAB, did not appreciate any wheezes, rales, or rhonchi Abdomen: + BS; soft, ND, NT Vascular: 2+ pedal pulses b/l; 2+ radial pulses b/l Extremities: no peripheral edema, no muscle wasting Neuro: strength and sensation intact in upper and lower extremities b/l   Labs on Admission: I have personally reviewed following labs and imaging studies  CBC: Recent Labs  Lab 03/04/22 1620 03/04/22 1655 03/04/22 1755  WBC 14.9*  --   --   NEUTROABS 10.9*  --   --   HGB 10.2* 11.6* 13.3  HCT 33.6* 34.0* 39.0  MCV 85.7  --   --   PLT 661*  --   --    Basic Metabolic Panel: Recent Labs  Lab 03/04/22 1620 03/04/22 1655 03/04/22 1755  NA 135 136 136  K  4.2 4.2 4.2  CL 99 98 98  CO2 26  --   --   GLUCOSE 198* 197* 191*  BUN _0 CREATININE 0.71 0.60 0.60  CALCIUM 8.6*  --   --    GFR: Estimated Creatinine Clearance: 112.4 mL/min (by C-G formula based on SCr of 0.6 mg/dL). Liver Function Tests: Recent Labs  Lab 03/04/22 1620  AST 16  ALT 8  ALKPHOS 99  BILITOT 0.5  PROT 7.2  ALBUMIN 2.8*   No results for input(s): "LIPASE", "AMYLASE" in the last 168 hours. No results for input(s): "AMMONIA" in the last 168 hours. Coagulation Profile: No results for input(s): "INR", "PROTIME" in the last 168 hours. Cardiac Enzymes: No results for input(s): "CKTOTAL", "CKMB", "CKMBINDEX", "TROPONINI" in the last 168 hours. BNP (last 3 results) No results for input(s): "PROBNP" in the last 8760 hours. HbA1C: No results for input(s): "HGBA1C" in the last 72 hours. CBG: Recent Labs  Lab 03/04/22 1619  GLUCAP 174*   Lipid Profile: No results for input(s): "CHOL", "HDL", "LDLCALC", "TRIG", "CHOLHDL", "LDLDIRECT" in the last 72 hours. Thyroid  Function Tests: No results for input(s): "TSH", "T4TOTAL", "FREET4", "T3FREE", "THYROIDAB" in the last 72 hours. Anemia Panel: No results for input(s): "VITAMINB12", "FOLATE", "FERRITIN", "TIBC", "IRON", "RETICCTPCT" in the last 72 hours. Urine analysis:    Component Value Date/Time   COLORURINE YELLOW 01/25/2015 0302   APPEARANCEUR CLOUDY (A) 01/25/2015 0302   LABSPEC 1.010 01/25/2015 0302   PHURINE 7.0 01/25/2015 0302   GLUCOSEU NEGATIVE 01/25/2015 0302   HGBUR NEGATIVE 01/25/2015 0302   BILIRUBINUR negative 11/24/2020 1713   KETONESUR NEGATIVE 01/25/2015 0302   PROTEINUR Positive (A) 11/24/2020 1713   PROTEINUR NEGATIVE 01/25/2015 0302   UROBILINOGEN 0.2 11/24/2020 1713   UROBILINOGEN 2.0 (H) 01/25/2015 0302   NITRITE negative 11/24/2020 1713   NITRITE NEGATIVE 01/25/2015 0302   LEUKOCYTESUR Negative 11/24/2020 1713    Radiological Exams on Admission: CT PELVIS W CONTRAST  Result Date: 03/04/2022 CLINICAL DATA:  Right groin abscess, upper thigh swelling, status post I&D on Friday EXAM: CT PELVIS WITH CONTRAST TECHNIQUE: Multidetector CT imaging of the pelvis was performed using the standard protocol following the bolus administration of intravenous contrast. RADIATION DOSE REDUCTION: This exam was performed according to the departmental dose-optimization program which includes automated exposure control, adjustment of the mA and/or kV according to patient size and/or use of iterative reconstruction technique. CONTRAST:  174m OMNIPAQUE IOHEXOL 300 MG/ML  SOLN COMPARISON:  CT abdomen/pelvis dated 01/25/2015 FINDINGS: Urinary Tract:  Thick-walled bladder. Bowel: Status post ileocecal resection with suture line in the right lower quadrant. Additional small bowel suture line in the left lower quadrant. Vascular/Lymphatic: No evidence of aneurysm. No suspicious pelvic lymphadenopathy. Small right inguinal nodes measuring up to 12 mm short axis (series 5/image 36), likely reactive.  Reproductive:  Uterus is within normal limits. Right ovary is notable for a dominant 3.6 cm cyst/follicle, simple/physiologic. No follow-up is recommended. Left ovary is within normal limits. Other:  Trace pelvic fluid, physiologic. Musculoskeletal: Subcutaneous stranding in the medial right upper thigh/lower groin (series 5/image 114), suggesting cellulitis. Tiny focus of subcutaneous gas is likely related to recent intervention (series 5/image 108). No drainable fluid collection/abscess. Visualized osseous structures are within normal limits. IMPRESSION: Cellulitis in the medial right upper thigh with postprocedural changes. No drainable fluid collection/abscess. Thick-walled bladder, correlate for cystitis. Electronically Signed   By: SJulian HyM.D.   On: 03/04/2022 18:57      Assessment/Plan  Principal Problem:   Cellulitis of right lower extremity Active Problems:   DM2 (diabetes mellitus, type 2) (HCC)   Sepsis ([place])   Acute anemia       #) Sepsis due to right lower extremity cellulitis: Right medial thigh erythema, swelling, increased warmth, tenderness, associated purulent drainage, status post I&D on 03/02/2022, with progression of the symptoms in spite of good compliance with interval oral clindamycin, with CT scan performed today confirming the presence of cellulitis in the absence of any residual drainable fluid collection or abscess, also showing no evidence of subcutaneous gas to suggest an element of necrotizing fasciitis.  SIRS criteria met via leukocytosis and tachycardia. Will check STAT LA.  Of note, in the absence of any evidence of end organ damage, pt's sepsis does not meet criteria to be considered severe in nature. Also, in the absence of LA level greater than or equal to 4.0, and in the absence of any associated hypotension refractory to IVF's, there are no indications for administration of a 30 mL/kg IVF bolus at this time.   IV vancomycin initiated in the ED  this evening, which will be continued in the setting of her cellulitis associated purulent drainage. Of note IV vancomycin was administered prior to collection of blood cultures.  Of note, CT scan also showed evidence of bladder wall thickening.  Will check urinalysis to further evaluate, while noting no acute urinary symptoms at this time.   Plan: CBC w/ diff and CMP in AM.  IV vancomycin, as above.  Stat lactic acid.  Prn IV fentanyl.  NS at 125 cc/h x 12 hours.  Check UA.            #) Acute normocytic anemia: Presenting CBC reflects hemoglobin of 10.2 with normocytic/will properties, relative to most recent prior hemoglobin of 12.9 in May 2022.  No overt evidence of acute blood loss, although there may be an element of postop loss in the setting of her extensive cellulitis with recent I&D of associated abscess.  Not on any blood thinners as an outpatient.  Plan: Add on iron studies.  Check INR.  Repeat CBC in the morning.          #) Type 2 Diabetes Mellitus: documented history of such.  Poorly controlled as an outpatient, with most recent hemoglobin A1c noted to be 11.8% in May 2022 home insulin regimen: Tresiba 30 units SQ nightly. Home oral hypoglycemic agents: Metformin. presenting blood sugar: 198. in terms of initial dose of basal insulin to be started during this hospitalization, will resume approximately half of outpatient dose in order to reduce risk for ensuing hypoglycemia.    Plan: accuchecks QAC and HS with low dose SSI.  Tresiba 20 units SQ nightly. hold home oral hypoglycemic agents during this hospitalization.  Check hemoglobin A1c level.          DVT prophylaxis: SCD's   Code Status: Full code Family Communication: none Disposition Plan: Per Rounding Team Consults called: none;  Admission status: inpatient.    PLEASE NOTE THAT DRAGON DICTATION SOFTWARE WAS USED IN THE CONSTRUCTION OF THIS NOTE.   [place]ward DO [facility]  From [place]   03/05/2022, 5:57 AM

## 2022-03-05 NOTE — ED Notes (Signed)
ED TO INPATIENT HANDOFF REPORT  ED Nurse Name and Phone #: Lenell Antu Name/Age/Gender Connie Williamson 26 y.o. female Room/Bed: 030C/030C  Code Status   Code Status: Full Code  Home/SNF/Other Home Patient oriented to: self, place, time, and situation Is this baseline? Yes   Triage Complete: Triage complete  Chief Complaint Cellulitis of right lower extremity [L03.115]  Triage Note Pt reports infected cyst to the right thigh/groin area that has gotten worse over the past week. Pt had and I&D Friday and had packing removed today with purulent drainage. Urgent care sent pt over for concern for infection and having a scan. Pt had a fever Friday. Hx of diabetes. Pt has been taking hydrocodone for pain relief.    Allergies No Known Allergies  Level of Care/Admitting Diagnosis ED Disposition     ED Disposition  Admit   Condition  --   Comment  Hospital Area: [facility] [[id number]]  Level of Care: Telemetry Medical [104]  May admit patient to [facility] or [facility] if equivalent level of care is available:: No  Covid Evaluation: Asymptomatic - no recent exposure (last 10 days) testing not required  Diagnosis: Cellulitis of right lower extremity [169678]  Admitting Physician: Angie Fava [[id number]]  Attending Physician: Angie Fava [[id number]]  Certification:: I certify this patient will need inpatient services for at least 2 midnights  Estimated Length of Stay: 2          B Medical/Surgery History Past Medical History:  Diagnosis Date   Asthma    Crohn's disease (HCC)    Diabetes mellitus    Past Surgical History:  Procedure Laterality Date   ADENOIDECTOMY     BOWEL RESECTION     TONSILLECTOMY       A IV Location/Drains/Wounds Patient Lines/Drains/Airways Status     Active Line/Drains/Airways     Name Placement date Placement time Site Days   Peripheral IV 03/04/22 20 G Anterior;Left Forearm 03/04/22  1620  Forearm  1             Intake/Output Last 24 hours  Intake/Output Summary (Last 24 hours) at 03/05/2022 1911 Last data filed at 03/05/2022 1627 Gross per 24 hour  Intake 1450 ml  Output --  Net 1450 ml    Labs/Imaging Results for orders placed or performed during the hospital encounter of 03/04/22 (from the past 48 hour(s))  CBG monitoring, ED     Status: Abnormal   Collection Time: 03/04/22  4:19 PM  Result Value Ref Range   Glucose-Capillary 174 (H) 70 - 99 mg/dL    Comment: Glucose reference range applies only to samples taken after fasting for at least 8 hours.  Comprehensive metabolic panel     Status: Abnormal   Collection Time: 03/04/22  4:20 PM  Result Value Ref Range   Sodium 135 135 - 145 mmol/L   Potassium 4.2 3.5 - 5.1 mmol/L    Comment: HEMOLYSIS AT THIS LEVEL MAY AFFECT RESULT   Chloride 99 98 - 111 mmol/L   CO2 26 22 - 32 mmol/L   Glucose, Bld 198 (H) 70 - 99 mg/dL    Comment: Glucose reference range applies only to samples taken after fasting for at least 8 hours.   BUN 6 6 - 20 mg/dL   Creatinine, Ser 2.77 0.44 - 1.00 mg/dL   Calcium 8.6 (L) 8.9 - 10.3 mg/dL   Total Protein 7.2 6.5 - 8.1 g/dL   Albumin 2.8 (L)  3.5 - 5.0 g/dL   AST 16 15 - 41 U/L    Comment: HEMOLYSIS AT THIS LEVEL MAY AFFECT RESULT   ALT 8 0 - 44 U/L    Comment: HEMOLYSIS AT THIS LEVEL MAY AFFECT RESULT   Alkaline Phosphatase 99 38 - 126 U/L   Total Bilirubin 0.5 0.3 - 1.2 mg/dL    Comment: HEMOLYSIS AT THIS LEVEL MAY AFFECT RESULT   GFR, Estimated >60 >60 mL/min    Comment: (NOTE) Calculated using the CKD-EPI Creatinine Equation (2021)    Anion gap 10 5 - 15    Comment: Performed at [facility], [address], [place], [place] [address]  CBC with Differential     Status: Abnormal   Collection Time: 03/04/22  4:20 PM  Result Value Ref Range   WBC 14.9 (H) 4.0 - 10.5 K/uL   RBC 3.92 3.87 - 5.11 MIL/uL   Hemoglobin 10.2 (L) 12.0 - 15.0 g/dL   HCT 00.1 (L) 74.9 - 44.9 %   MCV 85.7  80.0 - 100.0 fL   MCH 26.0 26.0 - 34.0 pg   MCHC 30.4 30.0 - 36.0 g/dL   RDW 67.5 (H) 91.6 - 38.4 %   Platelets 661 (H) 150 - 400 K/uL   nRBC 0.0 0.0 - 0.2 %   Neutrophils Relative % 72 %   Neutro Abs 10.9 (H) 1.7 - 7.7 K/uL   Lymphocytes Relative 17 %   Lymphs Abs 2.5 0.7 - 4.0 K/uL   Monocytes Relative 7 %   Monocytes Absolute 1.0 0.1 - 1.0 K/uL   Eosinophils Relative 3 %   Eosinophils Absolute 0.4 0.0 - 0.5 K/uL   Basophils Relative 0 %   Basophils Absolute 0.1 0.0 - 0.1 K/uL   Immature Granulocytes 1 %   Abs Immature Granulocytes 0.13 (H) 0.00 - 0.07 K/uL    Comment: Performed at [facility], [address], [place], [place] [address]  I-Stat beta hCG blood, ED     Status: None   Collection Time: 03/04/22  4:52 PM  Result Value Ref Range   I-stat hCG, quantitative <5.0 <5 mIU/mL   Comment 3            Comment:   GEST. AGE      CONC.  (mIU/mL)   <=1 WEEK        5 - 50     2 WEEKS       50 - 500     3 WEEKS       100 - 10,000     4 WEEKS     1,000 - 30,000        FEMALE AND NON-PREGNANT FEMALE:     LESS THAN 5 mIU/mL   I-stat chem 8, ED     Status: Abnormal   Collection Time: 03/04/22  4:55 PM  Result Value Ref Range   Sodium 136 135 - 145 mmol/L   Potassium 4.2 3.5 - 5.1 mmol/L   Chloride 98 98 - 111 mmol/L   BUN 6 6 - 20 mg/dL   Creatinine, Ser 3.57 0.44 - 1.00 mg/dL   Glucose, Bld 017 (H) 70 - 99 mg/dL    Comment: Glucose reference range applies only to samples taken after fasting for at least 8 hours.   Calcium, Ion 1.11 (L) 1.15 - 1.40 mmol/L   TCO2 30 22 - 32 mmol/L   Hemoglobin 11.6 (L) 12.0 - 15.0 g/dL   HCT 79.3 (L)  36.0 - 46.0 %  I-Stat beta hCG blood, ED ([facility], [facility], [facility] only)     Status: None   Collection Time: 03/04/22  5:52 PM  Result Value Ref Range   I-stat hCG, quantitative <5.0 <5 mIU/mL   Comment 3            Comment:   GEST. AGE      CONC.  (mIU/mL)   <=1 WEEK        5 - 50     2 WEEKS       50 - 500     3 WEEKS       100 - 10,000     4  WEEKS     1,000 - 30,000        FEMALE AND NON-PREGNANT FEMALE:     LESS THAN 5 mIU/mL   I-stat chem 8, ed     Status: Abnormal   Collection Time: 03/04/22  5:55 PM  Result Value Ref Range   Sodium 136 135 - 145 mmol/L   Potassium 4.2 3.5 - 5.1 mmol/L   Chloride 98 98 - 111 mmol/L   BUN 6 6 - 20 mg/dL   Creatinine, Ser 6.29 0.44 - 1.00 mg/dL   Glucose, Bld 528 (H) 70 - 99 mg/dL    Comment: Glucose reference range applies only to samples taken after fasting for at least 8 hours.   Calcium, Ion 1.10 (L) 1.15 - 1.40 mmol/L   TCO2 30 22 - 32 mmol/L   Hemoglobin 13.3 12.0 - 15.0 g/dL   HCT 41.3 24.4 - 01.0 %  Urinalysis, Complete w Microscopic     Status: Abnormal   Collection Time: 03/05/22  6:52 AM  Result Value Ref Range   Color, Urine YELLOW YELLOW   APPearance CLEAR CLEAR   Specific Gravity, Urine 1.025 1.005 - 1.030   pH 6.0 5.0 - 8.0   Glucose, UA NEGATIVE NEGATIVE mg/dL   Hgb urine dipstick MODERATE (A) NEGATIVE   Bilirubin Urine NEGATIVE NEGATIVE   Ketones, ur 20 (A) NEGATIVE mg/dL   Protein, ur NEGATIVE NEGATIVE mg/dL   Nitrite NEGATIVE NEGATIVE   Leukocytes,Ua SMALL (A) NEGATIVE   RBC / HPF 21-50 0 - 5 RBC/hpf   WBC, UA 6-10 0 - 5 WBC/hpf   Bacteria, UA NONE SEEN NONE SEEN   Squamous Epithelial / LPF 6-10 0 - 5    Comment: Performed at [facility], [address], [place], [place] [address]  Lactic acid, plasma     Status: None   Collection Time: 03/05/22  8:00 AM  Result Value Ref Range   Lactic Acid, Venous 0.8 0.5 - 1.9 mmol/L    Comment: Performed at [facility], [address] [place], [place] [address]  CBC with Differential/Platelet     Status: Abnormal   Collection Time: 03/05/22  8:00 AM  Result Value Ref Range   WBC 12.3 (H) 4.0 - 10.5 K/uL   RBC 3.48 (L) 3.87 - 5.11 MIL/uL   Hemoglobin 9.1 (L) 12.0 - 15.0 g/dL   HCT 34.7 (L) 42.5 - 95.6 %   MCV 87.4 80.0 - 100.0 fL   MCH 26.1 26.0 - 34.0 pg   MCHC 29.9 (L) 30.0 - 36.0 g/dL   RDW 38.7  (H) 56.4 - 15.5 %   Platelets 579 (H) 150 - 400 K/uL   nRBC 0.0 0.0 - 0.2 %   Neutrophils Relative % 72 %   Neutro Abs 8.9 (  H) 1.7 - 7.7 K/uL   Lymphocytes Relative 16 %   Lymphs Abs 1.9 0.7 - 4.0 K/uL   Monocytes Relative 8 %   Monocytes Absolute 1.0 0.1 - 1.0 K/uL   Eosinophils Relative 3 %   Eosinophils Absolute 0.4 0.0 - 0.5 K/uL   Basophils Relative 0 %   Basophils Absolute 0.1 0.0 - 0.1 K/uL   Immature Granulocytes 1 %   Abs Immature Granulocytes 0.10 (H) 0.00 - 0.07 K/uL    Comment: Performed at [facility], [address], [place], [place] [address]  Comprehensive metabolic panel     Status: Abnormal   Collection Time: 03/05/22  8:00 AM  Result Value Ref Range   Sodium 139 135 - 145 mmol/L   Potassium 3.8 3.5 - 5.1 mmol/L   Chloride 106 98 - 111 mmol/L   CO2 24 22 - 32 mmol/L   Glucose, Bld 138 (H) 70 - 99 mg/dL    Comment: Glucose reference range applies only to samples taken after fasting for at least 8 hours.   BUN <5 (L) 6 - 20 mg/dL   Creatinine, Ser 8.29 0.44 - 1.00 mg/dL   Calcium 8.2 (L) 8.9 - 10.3 mg/dL   Total Protein 6.6 6.5 - 8.1 g/dL   Albumin 2.4 (L) 3.5 - 5.0 g/dL   AST 7 (L) 15 - 41 U/L   ALT 9 0 - 44 U/L   Alkaline Phosphatase 86 38 - 126 U/L   Total Bilirubin 0.5 0.3 - 1.2 mg/dL   GFR, Estimated >56 >21 mL/min    Comment: (NOTE) Calculated using the CKD-EPI Creatinine Equation (2021)    Anion gap 9 5 - 15    Comment: Performed at [facility], [address], [place], [place] [address]  Magnesium     Status: None   Collection Time: 03/05/22  8:00 AM  Result Value Ref Range   Magnesium 1.8 1.7 - 2.4 mg/dL    Comment: Performed at [facility], [address] [place], [place] [address]  Hemoglobin A1c     Status: Abnormal   Collection Time: 03/05/22  8:00 AM  Result Value Ref Range   Hgb A1c MFr Bld 10.7 (H) 4.8 - 5.6 %    Comment: (NOTE) Pre diabetes:          5.7%-6.4%  Diabetes:              >6.4%  Glycemic control  for   <7.0% adults with diabetes    Mean Plasma Glucose 260.39 mg/dL    Comment: Performed at [facility], [address] [place], [place] [address]  Protime-INR     Status: None   Collection Time: 03/05/22  8:00 AM  Result Value Ref Range   Prothrombin Time 14.6 11.4 - 15.2 seconds   INR 1.2 0.8 - 1.2    Comment: (NOTE) INR goal varies based on device and disease states. Performed at [facility], [address], [place], [place] [address]   Ferritin     Status: None   Collection Time: 03/05/22  8:00 AM  Result Value Ref Range   Ferritin 133 11 - 307 ng/mL    Comment: Performed at [facility], [address] [place], [place] [address]  Iron and TIBC     Status: Abnormal   Collection Time: 03/05/22  8:00 AM  Result Value Ref Range   Iron 31 28 - 170 ug/dL  TIBC 197 (L) 250 - 450 ug/dL   Saturation Ratios 16 10.4 - 31.8 %   UIBC 166 ug/dL    Comment: Performed at [facility], [address], [place], [place] [address]  CBG monitoring, ED     Status: None   Collection Time: 03/05/22 11:50 AM  Result Value Ref Range   Glucose-Capillary 85 70 - 99 mg/dL    Comment: Glucose reference range applies only to samples taken after fasting for at least 8 hours.   Comment 1 Notify RN   CBG monitoring, ED     Status: Abnormal   Collection Time: 03/05/22  5:59 PM  Result Value Ref Range   Glucose-Capillary 177 (H) 70 - 99 mg/dL    Comment: Glucose reference range applies only to samples taken after fasting for at least 8 hours.   CT PELVIS W CONTRAST  Result Date: 03/04/2022 CLINICAL DATA:  Right groin abscess, upper thigh swelling, status post I&D on Friday EXAM: CT PELVIS WITH CONTRAST TECHNIQUE: Multidetector CT imaging of the pelvis was performed using the standard protocol following the bolus administration of intravenous contrast. RADIATION DOSE REDUCTION: This exam was performed according to the departmental dose-optimization program which includes  automated exposure control, adjustment of the mA and/or kV according to patient size and/or use of iterative reconstruction technique. CONTRAST:  OMNIPAQUE IOHEXOL 300 MG/ML  SOLN COMPARISON:  CT abdomen/pelvis dated 01/25/2015 FINDINGS: Urinary Tract:  Thick-walled bladder. Bowel: Status post ileocecal resection with suture line in the right lower quadrant. Additional small bowel suture line in the left lower quadrant. Vascular/Lymphatic: No evidence of aneurysm. No suspicious pelvic lymphadenopathy. Small right inguinal nodes measuring up to 12 mm short axis (series 5/image 36), likely reactive. Reproductive:  Uterus is within normal limits. Right ovary is notable for a dominant 3.6 cm cyst/follicle, simple/physiologic. No follow-up is recommended. Left ovary is within normal limits. Other:  Trace pelvic fluid, physiologic. Musculoskeletal: Subcutaneous stranding in the medial right upper thigh/lower groin (series 5/image 114), suggesting cellulitis. Tiny focus of subcutaneous gas is likely related to recent intervention (series 5/image 108). No drainable fluid collection/abscess. Visualized osseous structures are within normal limits. IMPRESSION: Cellulitis in the medial right upper thigh with postprocedural changes. No drainable fluid collection/abscess. Thick-walled bladder, correlate for cystitis. Electronically Signed   By: Charline Bills M.D.   On: 03/04/2022 18:57    Pending Labs Unresulted Labs (From admission, onward)     Start     Ordered   03/06/22 0500  CBC  Tomorrow morning,   R        03/05/22 1037   03/06/22 0500  Basic metabolic panel  Tomorrow morning,   R        03/05/22 1037            Vitals/Pain Today's Vitals   03/05/22 1330 03/05/22 1537 03/05/22 1615 03/05/22 1627  BP: 102/60  109/78   Pulse: 99  97   Resp: 18  16   Temp:  98.3 F (36.8 C)    TempSrc:  Oral    SpO2: 100%  98%   Weight:      Height:      PainSc:    3     Isolation Precautions No  active isolations  Medications Medications  acetaminophen (TYLENOL) tablet 650 mg (650 mg Oral Given 03/05/22 0937)    Or  acetaminophen (TYLENOL) suppository 650 mg ( Rectal See Alternative 03/05/22 0937)  naloxone (NARCAN) injection 0.4 mg (has no  administration in time range)  fentaNYL (SUBLIMAZE) injection 50 mcg (has no administration in time range)  0.9 %  sodium chloride infusion (0 mLs Intravenous Stopped 03/05/22 1627)  insulin aspart (novoLOG) injection 0-9 Units (2 Units Subcutaneous Given 03/05/22 1809)  vancomycin (VANCOREADY) IVPB 750 mg/150 mL (0 mg Intravenous Stopped 03/05/22 1040)  insulin glargine-yfgn (SEMGLEE) injection 20 Units (has no administration in time range)  iohexol (OMNIPAQUE) 300 MG/ML solution 100 mL (100 mLs Intravenous Contrast Given 03/04/22 1846)  sodium chloride 0.9 % bolus 1,000 mL (0 mLs Intravenous Stopped 03/05/22 0553)  fentaNYL (SUBLIMAZE) injection 50 mcg (50 mcg Intravenous Given 03/05/22 0349)  ondansetron (ZOFRAN) injection 4 mg (4 mg Intravenous Given 03/05/22 0348)  vancomycin (VANCOREADY) IVPB 1500 mg/300 mL (0 mg Intravenous Stopped 03/05/22 0642)  lidocaine-EPINEPHrine (XYLOCAINE W/EPI) 2 %-1:200000 (PF) injection 20 mL (20 mLs Intradermal Given 03/05/22 0547)    Mobility walks Low fall risk   Focused Assessments    R Recommendations: See Admitting Provider Note  Report given to:   Additional Notes:

## 2022-03-05 NOTE — Progress Notes (Signed)
Pharmacy Antibiotic Note  Connie Williamson is a 26 y.o. female admitted on 03/04/2022 with  cellulitis/wound infection of the right thigh/groin .  Pharmacy has been consulted for Vancomycin dosing. WBC elevated. Renal function good.   Plan: Vancomycin 750 mg IV q12h >>>Estimated AUC: 433 Trend WBC, temp, renal function  F/U infectious work-up Drug levels as indicated   Height: 5\' 3"  (160 cm) Weight: 89.8 kg (198 lb) IBW/kg (Calculated) : 52.4  Temp (24hrs), Avg:98.5 F (36.9 C), Min:98.2 F (36.8 C), Max:98.8 F (37.1 C)  Recent Labs  Lab 03/04/22 1620 03/04/22 1655 03/04/22 1755  WBC 14.9*  --   --   CREATININE 0.71 0.60 0.60    Estimated Creatinine Clearance: 112.4 mL/min (by C-G formula based on SCr of 0.6 mg/dL).    No Known Allergies  03/06/22, PharmD, BCPS Clinical Pharmacist Phone: [phone]

## 2022-03-05 NOTE — Progress Notes (Signed)
[facility]                                                                              Connie Williamson, is a 26 y.o. female, DOB - 06/20/1996, OIZ:[id number] Admit date - 03/04/2022    Outpatient Primary MD for the patient is Glendale Chard, MD  LOS - 0  days  Chief Complaint  Patient presents with   Abscess       Brief summary   Patient is a 26 year old female with Crohn's disease, diabetes mellitus presented to ED with right thigh cellulitis, worsening pain and purulent discharge.  Patient reported that 1 week ago she started having swelling in the upper medial right thigh with increasing pain, swelling and drainage.  She went to the urgent care on 8/25, I&D was done with packing.  Patient was placed on oral clindamycin.  Patient reports she followed up in urgent care on Sunday, 8/27 for removal of the packing material however she had progression of the redness tenderness, increased warmth and purulent drainage.  She was then prompted to the ED.  No fevers or chills. In ED, temp 98.4, pulse 104, RR 16, BP 119/76-> 105/67, leukocytosis WBCs 12.3.  CT pelvis did not show residual drainable collection. Repeat I&D was done by EDP, per documentation, there was still copious purulence after removing the bandage. Patient was admitted for further work-up  Assessment & Plan    Principal Problem: Sepsis due to abscess and cellulitis of right upper medial thigh with outpatient failure to treatment -Met sepsis criteria with leukocytosis, tachycardia, hypotension, source secondary to abscess right thigh.  Patient was on clindamycin prior to admission. -Bedside  I&D done by EDP, on exam, still has significant induration, tenderness,warmth with erythema.  Requested surgery consult for evaluation -Continue IV vancomycin, continue IV fluid hydration  Active Problems:   DM2 (diabetes mellitus, type 2) ([place]), IDDM, uncontrolled with hyperglycemia -Uncontrolled, hemoglobin A1c  10.7 on 8/28 -Continue Semglee 20 units at bedtime, sliding scale insulin sensitive Recent Labs    03/04/22 1619  GLUCAP 174*      Acute normocytic anemia -Baseline hemoglobin around 12.9, hemoglobin 10.2 on admission -Hemoglobin 9.1, with likely dilutional component, on IV fluid hydration -Continue to follow H&H    Crohn's disease ([place]) -No acute issues related to Crohns disease    Obesity (BMI 30-39.9) Estimated body mass index is 35.07 kg/m as calculated from the following:   Height as of this encounter: _0  (1.6 m).   Weight as of this encounter: 89.8 kg.  Code Status: Full code DVT Prophylaxis:  SCDs Start: 03/05/22 0550   Level of Care: Level of care: Telemetry Medical Family Communication: Updated patient   Disposition Plan:      Remains inpatient appropriate: Continue IV antibiotics, surgery evaluation requested, will likely need 24 to 48 hours of IV antibiotics before transition to oral antibiotics for safe discharge home   Procedures:  Bedside I&D by EDP  Consultants:   General surgery  Antimicrobials:   Anti-infectives (From admission, onward)    Start     Dose/Rate Route Frequency Ordered Stop   03/05/22 1000  vancomycin (VANCOREADY)  IVPB 750 mg/150 mL        750 mg 150 mL/hr over 60 Minutes Intravenous Every 12 hours 03/05/22 0607     03/05/22 0145  vancomycin (VANCOREADY) IVPB 1500 mg/300 mL        1,500 mg 150 mL/hr over 120 Minutes Intravenous  Once 03/05/22 0135 03/05/22 0642          Medications  insulin aspart  0-9 Units Subcutaneous TID WC   insulin glargine-yfgn  20 Units Subcutaneous QHS      Subjective:   Connie Williamson was seen and examined today.  Still having pain sharp in the right groin area with redness, discharge.  No fevers or chills nausea vomiting, abdominal pain.    Objective:   Vitals:   03/05/22 0330 03/05/22 0344 03/05/22 0515 03/05/22 0815  BP: 102/68 102/77 101/67 (!) 97/55  Pulse: 94 94 94 98   Resp: _0 Temp: 98.5 F (36.9 C) 98.8 F (37.1 C)  98.8 F (37.1 C)  TempSrc: Oral Oral  Oral  SpO2: 96% 100% 96% 98%  Weight:      Height:        Intake/Output Summary (Last 24 hours) at 03/05/2022 1029 Last data filed at 03/05/2022 2683 Gross per 24 hour  Intake 300 ml  Output --  Net 300 ml     Wt Readings from Last 3 Encounters:  03/04/22 89.8 kg  11/24/20 109.1 kg  02/03/20 (!) 101.6 kg     Exam General: Alert and oriented x 3, NAD Cardiovascular: S1 S2 auscultated,  RRR Respiratory: Clear to auscultation bilaterally, no wheezing, rales or rhonchi Gastrointestinal: Soft, nontender, nondistended, + bowel sounds Ext: no pedal edema bilaterally Neuro: no new FND's Skin: Right medial thigh tenderness with induration noted, purulent discharge Psych: Normal affect and demeanor, alert and oriented x3     Data Reviewed:  I have personally reviewed following labs    CBC Lab Results  Component Value Date   WBC 12.3 (H) 03/05/2022   RBC 3.48 (L) 03/05/2022   HGB 9.1 (L) 03/05/2022   HCT 30.4 (L) 03/05/2022   MCV 87.4 03/05/2022   MCH 26.1 03/05/2022   PLT 579 (H) 03/05/2022   MCHC 29.9 (L) 03/05/2022   RDW 15.6 (H) 03/05/2022   LYMPHSABS 1.9 03/05/2022   MONOABS 1.0 03/05/2022   EOSABS 0.4 03/05/2022   BASOSABS 0.1 41/96/2229     Last metabolic panel Lab Results  Component Value Date   NA 139 03/05/2022   K 3.8 03/05/2022   CL 106 03/05/2022   CO2 24 03/05/2022   BUN <5 (L) 03/05/2022   CREATININE 0.73 03/05/2022   GLUCOSE 138 (H) 03/05/2022   GFRNONAA >60 03/05/2022   GFRAA 130 12/02/2019   CALCIUM 8.2 (L) 03/05/2022   PROT 6.6 03/05/2022   ALBUMIN 2.4 (L) 03/05/2022   LABGLOB 3.2 11/24/2020   AGRATIO 1.3 11/24/2020   BILITOT 0.5 03/05/2022   ALKPHOS 86 03/05/2022   AST 7 (L) 03/05/2022   ALT 9 03/05/2022   ANIONGAP 9 03/05/2022    CBG (last 3)  Recent Labs    03/04/22 1619  GLUCAP 174*      Coagulation Profile: Recent  Labs  Lab 03/05/22 0800  INR 1.2     Radiology Studies: I have personally reviewed the imaging studies  CT PELVIS W CONTRAST  Result Date: 03/04/2022 CLINICAL DATA:  Right groin abscess, upper thigh swelling, status post I&D on Friday IMPRESSION: Cellulitis in the medial  right upper thigh with postprocedural changes. No drainable fluid collection/abscess. Thick-walled bladder, correlate for cystitis. Electronically Signed   By: Julian Hy M.D.   On: 03/04/2022 18:57       Tregan Read M.D. [facility] 03/05/2022, 10:29 AM  Available via Epic secure chat 7am-7pm After 7 pm, please refer to night coverage provider listed on amion.

## 2022-03-05 NOTE — Consult Note (Signed)
Reason for Consult/Chief Complaint: prior thigh abscess Consultant: Isidoro Donning, MD  Connie Williamson is an 26 y.o. female.   HPI: 1F with h/o DM and s/p I&D x2 of R medial thigh abscess, sent to ED by UC after packing removal because "they said they didn't like the way it looked." Symptomatically, patient reports significant improvement from prior to I&D.  Past Medical History:  Diagnosis Date   Asthma    Crohn's disease (HCC)    Diabetes mellitus     Past Surgical History:  Procedure Laterality Date   ADENOIDECTOMY     BOWEL RESECTION     TONSILLECTOMY      Family History  Problem Relation Age of Onset   Diabetes Mother    Kidney failure Mother    Hypertension Mother    Healthy Father    Healthy Sister    Healthy Brother    Diabetes Maternal Grandmother    Hypertension Maternal Grandmother    Diabetes Maternal Grandfather    Hypertension Maternal Grandfather     Social History:  reports that she has never smoked. She has never used smokeless tobacco. She reports current alcohol use. She reports that she does not use drugs.  Allergies: No Known Allergies  Medications: I have reviewed the patient's current medications.  Results for orders placed or performed during the hospital encounter of 03/04/22 (from the past 48 hour(s))  CBG monitoring, ED     Status: Abnormal   Collection Time: 03/04/22  4:19 PM  Result Value Ref Range   Glucose-Capillary 174 (H) 70 - 99 mg/dL    Comment: Glucose reference range applies only to samples taken after fasting for at least 8 hours.  Comprehensive metabolic panel     Status: Abnormal   Collection Time: 03/04/22  4:20 PM  Result Value Ref Range   Sodium 135 135 - 145 mmol/L   Potassium 4.2 3.5 - 5.1 mmol/L    Comment: HEMOLYSIS AT THIS LEVEL MAY AFFECT RESULT   Chloride 99 98 - 111 mmol/L   CO2 26 22 - 32 mmol/L   Glucose, Bld 198 (H) 70 - 99 mg/dL    Comment: Glucose reference range applies only to samples taken after  fasting for at least 8 hours.   BUN 6 6 - 20 mg/dL   Creatinine, Ser 0.86 0.44 - 1.00 mg/dL   Calcium 8.6 (L) 8.9 - 10.3 mg/dL   Total Protein 7.2 6.5 - 8.1 g/dL   Albumin 2.8 (L) 3.5 - 5.0 g/dL   AST 16 15 - 41 U/L    Comment: HEMOLYSIS AT THIS LEVEL MAY AFFECT RESULT   ALT 8 0 - 44 U/L    Comment: HEMOLYSIS AT THIS LEVEL MAY AFFECT RESULT   Alkaline Phosphatase 99 38 - 126 U/L   Total Bilirubin 0.5 0.3 - 1.2 mg/dL    Comment: HEMOLYSIS AT THIS LEVEL MAY AFFECT RESULT   GFR, Estimated >60 >60 mL/min    Comment: (NOTE) Calculated using the CKD-EPI Creatinine Equation (2021)    Anion gap 10 5 - 15    Comment: Performed at [facility], [address], [place], [place] [address]  CBC with Differential     Status: Abnormal   Collection Time: 03/04/22  4:20 PM  Result Value Ref Range   WBC 14.9 (H) 4.0 - 10.5 K/uL   RBC 3.92 3.87 - 5.11 MIL/uL   Hemoglobin 10.2 (L) 12.0 - 15.0 g/dL   HCT 96.2 (L) 95.2 -  46.0 %   MCV 85.7 80.0 - 100.0 fL   MCH 26.0 26.0 - 34.0 pg   MCHC 30.4 30.0 - 36.0 g/dL   RDW 58.5 (H) 27.7 - 82.4 %   Platelets 661 (H) 150 - 400 K/uL   nRBC 0.0 0.0 - 0.2 %   Neutrophils Relative % 72 %   Neutro Abs 10.9 (H) 1.7 - 7.7 K/uL   Lymphocytes Relative 17 %   Lymphs Abs 2.5 0.7 - 4.0 K/uL   Monocytes Relative 7 %   Monocytes Absolute 1.0 0.1 - 1.0 K/uL   Eosinophils Relative 3 %   Eosinophils Absolute 0.4 0.0 - 0.5 K/uL   Basophils Relative 0 %   Basophils Absolute 0.1 0.0 - 0.1 K/uL   Immature Granulocytes 1 %   Abs Immature Granulocytes 0.13 (H) 0.00 - 0.07 K/uL    Comment: Performed at [facility], [address] [place], [place] [address]  I-Stat beta hCG blood, ED     Status: None   Collection Time: 03/04/22  4:52 PM  Result Value Ref Range   I-stat hCG, quantitative <5.0 <5 mIU/mL   Comment 3            Comment:   GEST. AGE      CONC.  (mIU/mL)   <=1 WEEK        5 - 50     2 WEEKS       50 - 500     3 WEEKS       100 - 10,000     4  WEEKS     1,000 - 30,000        FEMALE AND NON-PREGNANT FEMALE:     LESS THAN 5 mIU/mL   I-stat chem 8, ED     Status: Abnormal   Collection Time: 03/04/22  4:55 PM  Result Value Ref Range   Sodium 136 135 - 145 mmol/L   Potassium 4.2 3.5 - 5.1 mmol/L   Chloride 98 98 - 111 mmol/L   BUN 6 6 - 20 mg/dL   Creatinine, Ser 1.44 0.44 - 1.00 mg/dL   Glucose, Bld 315 (H) 70 - 99 mg/dL    Comment: Glucose reference range applies only to samples taken after fasting for at least 8 hours.   Calcium, Ion 1.11 (L) 1.15 - 1.40 mmol/L   TCO2 30 22 - 32 mmol/L   Hemoglobin 11.6 (L) 12.0 - 15.0 g/dL   HCT 40.0 (L) 86.7 - 61.9 %  I-Stat beta hCG blood, ED ([facility], [facility], [facility] only)     Status: None   Collection Time: 03/04/22  5:52 PM  Result Value Ref Range   I-stat hCG, quantitative <5.0 <5 mIU/mL   Comment 3            Comment:   GEST. AGE      CONC.  (mIU/mL)   <=1 WEEK        5 - 50     2 WEEKS       50 - 500     3 WEEKS       100 - 10,000     4 WEEKS     1,000 - 30,000        FEMALE AND NON-PREGNANT FEMALE:     LESS THAN 5 mIU/mL   I-stat chem 8, ed     Status: Abnormal   Collection Time: 03/04/22  5:55 PM  Result Value Ref Range  Sodium 136 135 - 145 mmol/L   Potassium 4.2 3.5 - 5.1 mmol/L   Chloride 98 98 - 111 mmol/L   BUN 6 6 - 20 mg/dL   Creatinine, Ser 5.17 0.44 - 1.00 mg/dL   Glucose, Bld 616 (H) 70 - 99 mg/dL    Comment: Glucose reference range applies only to samples taken after fasting for at least 8 hours.   Calcium, Ion 1.10 (L) 1.15 - 1.40 mmol/L   TCO2 30 22 - 32 mmol/L   Hemoglobin 13.3 12.0 - 15.0 g/dL   HCT 07.3 71.0 - 62.6 %  Urinalysis, Complete w Microscopic     Status: Abnormal   Collection Time: 03/05/22  6:52 AM  Result Value Ref Range   Color, Urine YELLOW YELLOW   APPearance CLEAR CLEAR   Specific Gravity, Urine 1.025 1.005 - 1.030   pH 6.0 5.0 - 8.0   Glucose, UA NEGATIVE NEGATIVE mg/dL   Hgb urine dipstick MODERATE (A) NEGATIVE   Bilirubin Urine NEGATIVE  NEGATIVE   Ketones, ur 20 (A) NEGATIVE mg/dL   Protein, ur NEGATIVE NEGATIVE mg/dL   Nitrite NEGATIVE NEGATIVE   Leukocytes,Ua SMALL (A) NEGATIVE   RBC / HPF 21-50 0 - 5 RBC/hpf   WBC, UA 6-10 0 - 5 WBC/hpf   Bacteria, UA NONE SEEN NONE SEEN   Squamous Epithelial / LPF 6-10 0 - 5    Comment: Performed at [facility], [address] [place], [place] [address]  Lactic acid, plasma     Status: None   Collection Time: 03/05/22  8:00 AM  Result Value Ref Range   Lactic Acid, Venous 0.8 0.5 - 1.9 mmol/L    Comment: Performed at [facility], [address], [place], [place] [address]  CBC with Differential/Platelet     Status: Abnormal   Collection Time: 03/05/22  8:00 AM  Result Value Ref Range   WBC 12.3 (H) 4.0 - 10.5 K/uL   RBC 3.48 (L) 3.87 - 5.11 MIL/uL   Hemoglobin 9.1 (L) 12.0 - 15.0 g/dL   HCT 50.0 (L) 93.8 - 18.2 %   MCV 87.4 80.0 - 100.0 fL   MCH 26.1 26.0 - 34.0 pg   MCHC 29.9 (L) 30.0 - 36.0 g/dL   RDW 99.3 (H) 71.6 - 96.7 %   Platelets 579 (H) 150 - 400 K/uL   nRBC 0.0 0.0 - 0.2 %   Neutrophils Relative % 72 %   Neutro Abs 8.9 (H) 1.7 - 7.7 K/uL   Lymphocytes Relative 16 %   Lymphs Abs 1.9 0.7 - 4.0 K/uL   Monocytes Relative 8 %   Monocytes Absolute 1.0 0.1 - 1.0 K/uL   Eosinophils Relative 3 %   Eosinophils Absolute 0.4 0.0 - 0.5 K/uL   Basophils Relative 0 %   Basophils Absolute 0.1 0.0 - 0.1 K/uL   Immature Granulocytes 1 %   Abs Immature Granulocytes 0.10 (H) 0.00 - 0.07 K/uL    Comment: Performed at [facility], [address], [place], [place] [address]  Comprehensive metabolic panel     Status: Abnormal   Collection Time: 03/05/22  8:00 AM  Result Value Ref Range   Sodium 139 135 - 145 mmol/L   Potassium 3.8 3.5 - 5.1 mmol/L   Chloride 106 98 - 111 mmol/L   CO2 24 22 - 32 mmol/L   Glucose, Bld 138 (H) 70 - 99 mg/dL    Comment: Glucose reference range applies only  to samples taken after fasting for at least 8 hours.   BUN <5 (L) 6 -  20 mg/dL   Creatinine, Ser 7.61 0.44 - 1.00 mg/dL   Calcium 8.2 (L) 8.9 - 10.3 mg/dL   Total Protein 6.6 6.5 - 8.1 g/dL   Albumin 2.4 (L) 3.5 - 5.0 g/dL   AST 7 (L) 15 - 41 U/L   ALT 9 0 - 44 U/L   Alkaline Phosphatase 86 38 - 126 U/L   Total Bilirubin 0.5 0.3 - 1.2 mg/dL   GFR, Estimated >60 >73 mL/min    Comment: (NOTE) Calculated using the CKD-EPI Creatinine Equation (2021)    Anion gap 9 5 - 15    Comment: Performed at [facility], [address], [place], [place] [address]  Magnesium     Status: None   Collection Time: 03/05/22  8:00 AM  Result Value Ref Range   Magnesium 1.8 1.7 - 2.4 mg/dL    Comment: Performed at [facility], [address] [place], [place] [address]  Hemoglobin A1c     Status: Abnormal   Collection Time: 03/05/22  8:00 AM  Result Value Ref Range   Hgb A1c MFr Bld 10.7 (H) 4.8 - 5.6 %    Comment: (NOTE) Pre diabetes:          5.7%-6.4%  Diabetes:              >6.4%  Glycemic control for   <7.0% adults with diabetes    Mean Plasma Glucose 260.39 mg/dL    Comment: Performed at [facility], [address], [place], [place] [address]  Protime-INR     Status: None   Collection Time: 03/05/22  8:00 AM  Result Value Ref Range   Prothrombin Time 14.6 11.4 - 15.2 seconds   INR 1.2 0.8 - 1.2    Comment: (NOTE) INR goal varies based on device and disease states. Performed at [facility], [address] [place], [place] [address]   Ferritin     Status: None   Collection Time: 03/05/22  8:00 AM  Result Value Ref Range   Ferritin 133 11 - 307 ng/mL    Comment: Performed at [facility], [address] [place], [place] [address]  Iron and TIBC     Status: Abnormal   Collection Time: 03/05/22  8:00 AM  Result Value Ref Range   Iron 31 28 - 170 ug/dL   TIBC 993 (L) 716 - 967 ug/dL   Saturation Ratios 16 10.4 - 31.8 %   UIBC 166 ug/dL    Comment: Performed at [facility], [address], [place], [place]  [address]  CBG monitoring, ED     Status: None   Collection Time: 03/05/22 11:50 AM  Result Value Ref Range   Glucose-Capillary 85 70 - 99 mg/dL    Comment: Glucose reference range applies only to samples taken after fasting for at least 8 hours.   Comment 1 Notify RN     CT PELVIS W CONTRAST  Result Date: 03/04/2022 CLINICAL DATA:  Right groin abscess, upper thigh swelling, status post I&D on Friday EXAM: CT PELVIS WITH CONTRAST TECHNIQUE: Multidetector CT imaging of the pelvis was performed using the standard protocol following the bolus administration of intravenous contrast. RADIATION DOSE REDUCTION: This exam was performed according to the departmental dose-optimization program which includes automated exposure control, adjustment of the mA and/or kV according to patient  size and/or use of iterative reconstruction technique. CONTRAST:  OMNIPAQUE IOHEXOL 300 MG/ML  SOLN COMPARISON:  CT abdomen/pelvis dated 01/25/2015 FINDINGS: Urinary Tract:  Thick-walled bladder. Bowel: Status post ileocecal resection with suture line in the right lower quadrant. Additional small bowel suture line in the left lower quadrant. Vascular/Lymphatic: No evidence of aneurysm. No suspicious pelvic lymphadenopathy. Small right inguinal nodes measuring up to 12 mm short axis (series 5/image 36), likely reactive. Reproductive:  Uterus is within normal limits. Right ovary is notable for a dominant 3.6 cm cyst/follicle, simple/physiologic. No follow-up is recommended. Left ovary is within normal limits. Other:  Trace pelvic fluid, physiologic. Musculoskeletal: Subcutaneous stranding in the medial right upper thigh/lower groin (series 5/image 114), suggesting cellulitis. Tiny focus of subcutaneous gas is likely related to recent intervention (series 5/image 108). No drainable fluid collection/abscess. Visualized osseous structures are within normal limits. IMPRESSION: Cellulitis in the medial right upper thigh with  postprocedural changes. No drainable fluid collection/abscess. Thick-walled bladder, correlate for cystitis. Electronically Signed   By: Charline Bills M.D.   On: 03/04/2022 18:57    ROS 10 point review of systems is negative except as listed above in HPI.   Physical Exam Blood pressure 123/80, pulse 93, temperature 98.7 F (37.1 C), temperature source Oral, resp. rate 19, height 5\' 3"  (1.6 m), weight 89.8 kg, last menstrual period 03/04/2022, SpO2 98 %. Constitutional: well-developed, well-nourished HEENT: pupils equal, round, reactive to light, 84mm b/l, moist conjunctiva, external inspection of ears and nose normal, hearing intact Oropharynx: normal oropharyngeal mucosa, normal dentition Neck: no thyromegaly, trachea midline, no midline cervical tenderness to palpation Chest: breath sounds equal bilaterally, normal respiratory effort, no midline or lateral chest wall tenderness to palpation/deformity Abdomen: soft, NT, no bruising, no hepatosplenomegaly GU: normal female genitalia  Back: no wounds, no thoracic/lumbar spine tenderness to palpation, no thoracic/lumbar spine stepoffs Rectal: deferred Extremities: 2+ radial and pedal pulses bilaterally, intact motor and sensation bilateral UE and LE, no peripheral edema, R medial thigh induration without erythema, moderately TTP, prior I&D site with moderate drainage present MSK: unable to assess gait/station, no clubbing/cyanosis of fingers/toes, normal ROM of all four extremities Skin: warm, dry, no rashes Psych: normal memory, normal mood/affect     Assessment/Plan: 44F with abscess of R thigh, now s/p I&D and with residual induration. Anticipate continued improvement in clinical progress and I suspect that she would be able to be managed as an outpatient with close follow-up as she seems reliable. Low threshold for admission and IV abx given h/o DM, but defer this decision to ED/primary team. No additional surgical intervention  indicated, but if admitted, will follow peripherally. Continue local wound care with daily dressing changes.    3m, MD General and Trauma Surgery [facility]

## 2022-03-05 NOTE — ED Provider Notes (Signed)
[facility] EMERGENCY DEPARTMENT Provider Note   CSN: [id number] Arrival date & time: 03/04/22  1354     History  Chief Complaint  Patient presents with   Abscess    [name] is a 26 y.o. female with past medical history of Crohn's colitis and diabetes who presents emergency department chief complaint of right thigh abscess.  Patient developed symptoms last week.  On Friday she went to an urgent care where she had an I&D and was placed on oral clindamycin.  She has had 3 days of oral clindamycin and has had no significant improvement in her leg infection.  She went back to the urgent care today to have her packing removed.  She states that the nurse practitioner who saw her was extremely concerned about her leg as it has not gotten any better and sent her to the emergency department.  Patient reports severe pain swelling and drainage from the incision site of her right proximal medial thigh.  Patient denies fevers or chills.  She has a past medical history of abscesses that are recurrent but states she has never had 1 this bad.   Abscess      Home Medications Prior to Admission medications   Medication Sig Start Date End Date Taking? Authorizing Provider  cholecalciferol (VITAMIN D3) 25 MCG (1000 UNIT) tablet Take 1,000 Units by mouth daily.   Yes [provider]  HYDROcodone-acetaminophen (NORCO/VICODIN) 5-325 MG tablet Take 1 tablet by mouth every 4 (four) hours as needed for moderate pain or severe pain. 03/02/22  Yes [provider]  ibuprofen (ADVIL,MOTRIN) 200 MG tablet Take 400 mg by mouth 2 (two) times daily as needed (pain).   Yes [provider]  metFORMIN (GLUCOPHAGE) 500 MG tablet Take 500 mg by mouth daily. 02/13/22  Yes [provider]  TRESIBA 100 UNIT/ML SOLN Inject 30 Units into the skin at bedtime. 02/13/22  Yes [provider]  Cholecalciferol (VITAMIN D3) 1.25 MG (50000 UT) CAPS Take 1 capsule by  mouth on Tuesday and Friday Patient not taking: Reported on 03/05/2022 11/28/20   Charlesetta Ivory, NP  insulin degludec (TRESIBA FLEXTOUCH) 200 UNIT/ML FlexTouch Pen Inject 62 Units into the skin at bedtime. Patient not taking: Reported on 03/05/2022 11/24/20   Charlesetta Ivory, NP  Insulin Pen Needle (PEN NEEDLES) 32G X 4 MM MISC Use as directed with insulin pens 11/22/20   Dorothyann Peng, MD  ondansetron (ZOFRAN ODT) 4 MG disintegrating tablet Take 1 tablet (4 mg total) by mouth every 8 (eight) hours as needed for nausea. Patient not taking: Reported on 03/05/2022 01/25/15   Francee Piccolo, PA-C      Allergies    Patient has no known allergies.    Review of Systems   Review of Systems  Physical Exam Updated Vital Signs BP 101/67   Pulse 94   Temp 98.8 F (37.1 C) (Oral)   Resp 18   Ht 5\' 3"  (1.6 m)   Wt 89.8 kg   LMP 03/04/2022   SpO2 96%   BMI 35.07 kg/m  Physical Exam Vitals and nursing note reviewed.  Constitutional:      General: She is not in acute distress.    Appearance: She is well-developed. She is not diaphoretic.  HENT:     Head: Normocephalic and atraumatic.     Right Ear: External ear normal.     Left Ear: External ear normal.     Nose: Nose normal.     Mouth/Throat:  Mouth: Mucous membranes are moist.  Eyes:     General: No scleral icterus.    Conjunctiva/sclera: Conjunctivae normal.  Cardiovascular:     Rate and Rhythm: Normal rate and regular rhythm.     Heart sounds: Normal heart sounds. No murmur heard.    No friction rub. No gallop.  Pulmonary:     Effort: Pulmonary effort is normal. No respiratory distress.     Breath sounds: Normal breath sounds.  Abdominal:     General: Bowel sounds are normal. There is no distension.     Palpations: Abdomen is soft. There is no mass.     Tenderness: There is no abdominal tenderness. There is no guarding.  Musculoskeletal:     Cervical back: Normal range of motion.  Skin:    General: Skin is  warm and dry.     Comments: Right medial thigh with induration, desquamation, maceration of the groin consistent with likely tenia.  There is very small single incision in the medial thigh and active purulent discharge from the wound.  It is exquisitely tender to palpation.  Neurological:     Mental Status: She is alert and oriented to person, place, and time.  Psychiatric:        Behavior: Behavior normal.     ED Results / Procedures / Treatments   Labs (all labs ordered are listed, but only abnormal results are displayed) Labs Reviewed  COMPREHENSIVE METABOLIC PANEL - Abnormal; Notable for the following components:      Result Value   Glucose, Bld 198 (*)    Calcium 8.6 (*)    Albumin 2.8 (*)    All other components within normal limits  CBC WITH DIFFERENTIAL/PLATELET - Abnormal; Notable for the following components:   WBC 14.9 (*)    Hemoglobin 10.2 (*)    HCT 33.6 (*)    RDW 15.6 (*)    Platelets 661 (*)    Neutro Abs 10.9 (*)    Abs Immature Granulocytes 0.13 (*)    All other components within normal limits  CBG MONITORING, ED - Abnormal; Notable for the following components:   Glucose-Capillary 174 (*)    All other components within normal limits  I-STAT CHEM 8, ED - Abnormal; Notable for the following components:   Glucose, Bld 197 (*)    Calcium, Ion 1.11 (*)    Hemoglobin 11.6 (*)    HCT 34.0 (*)    All other components within normal limits  I-STAT CHEM 8, ED - Abnormal; Notable for the following components:   Glucose, Bld 191 (*)    Calcium, Ion 1.10 (*)    All other components within normal limits  LACTIC ACID, PLASMA  LACTIC ACID, PLASMA  CBC WITH DIFFERENTIAL/PLATELET  COMPREHENSIVE METABOLIC PANEL  MAGNESIUM  HEMOGLOBIN A1C  PROTIME-INR  FERRITIN  IRON AND TIBC  URINALYSIS, COMPLETE (UACMP) WITH MICROSCOPIC  I-STAT BETA HCG BLOOD, ED ([facility], [facility], [facility] ONLY)  I-STAT BETA HCG BLOOD, ED ([facility], [facility], [facility] ONLY)    EKG None  Radiology CT PELVIS W  CONTRAST  Result Date: 03/04/2022 CLINICAL DATA:  Right groin abscess, upper thigh swelling, status post I&D on Friday EXAM: CT PELVIS WITH CONTRAST TECHNIQUE: Multidetector CT imaging of the pelvis was performed using the standard protocol following the bolus administration of intravenous contrast. RADIATION DOSE REDUCTION: This exam was performed according to the departmental dose-optimization program which includes automated exposure control, adjustment of the mA and/or kV according to patient size and/or use of iterative reconstruction  technique. CONTRAST:  OMNIPAQUE IOHEXOL 300 MG/ML  SOLN COMPARISON:  CT abdomen/pelvis dated 01/25/2015 FINDINGS: Urinary Tract:  Thick-walled bladder. Bowel: Status post ileocecal resection with suture line in the right lower quadrant. Additional small bowel suture line in the left lower quadrant. Vascular/Lymphatic: No evidence of aneurysm. No suspicious pelvic lymphadenopathy. Small right inguinal nodes measuring up to 12 mm short axis (series 5/image 36), likely reactive. Reproductive:  Uterus is within normal limits. Right ovary is notable for a dominant 3.6 cm cyst/follicle, simple/physiologic. No follow-up is recommended. Left ovary is within normal limits. Other:  Trace pelvic fluid, physiologic. Musculoskeletal: Subcutaneous stranding in the medial right upper thigh/lower groin (series 5/image 114), suggesting cellulitis. Tiny focus of subcutaneous gas is likely related to recent intervention (series 5/image 108). No drainable fluid collection/abscess. Visualized osseous structures are within normal limits. IMPRESSION: Cellulitis in the medial right upper thigh with postprocedural changes. No drainable fluid collection/abscess. Thick-walled bladder, correlate for cystitis. Electronically Signed   By: Charline Bills M.D.   On: 03/04/2022 18:57    Procedures .Marland KitchenIncision and Drainage  Date/Time: 03/05/2022 5:30 AM  Performed by: Arthor Captain,  PA-C Authorized by: Arthor Captain, PA-C   Consent:    Consent obtained:  Verbal   Consent given by:  Patient   Risks discussed:  Bleeding, incomplete drainage, pain and damage to other organs   Alternatives discussed:  No treatment Universal protocol:    Procedure explained and questions answered to patient or proxy's satisfaction: yes     Relevant documents present and verified: yes     Test results available : yes     Imaging studies available: yes     Required blood products, implants, devices, and special equipment available: yes     Site/side marked: yes     Immediately prior to procedure, a time out was called: yes     Patient identity confirmed:  Verbally with patient Location:    Type:  Abscess Pre-procedure details:    Skin preparation:  Betadine Anesthesia:    Anesthesia method:  Local infiltration   Local anesthetic:  Lidocaine 2% WITH epi Procedure type:    Complexity:  Complex Procedure details:    Incision types:  Single straight and cruciate   Incision depth:  Subcutaneous   Wound management:  Probed and deloculated, irrigated with saline and extensive cleaning   Drainage:  Purulent   Drainage amount:  Moderate   Packing materials:  None Post-procedure details:    Procedure completion:  Tolerated well, no immediate complications     Medications Ordered in ED Medications  acetaminophen (TYLENOL) tablet 650 mg (has no administration in time range)    Or  acetaminophen (TYLENOL) suppository 650 mg (has no administration in time range)  naloxone (NARCAN) injection 0.4 mg (has no administration in time range)  fentaNYL (SUBLIMAZE) injection 50 mcg (has no administration in time range)  0.9 %  sodium chloride infusion (has no administration in time range)  insulin aspart (novoLOG) injection 0-9 Units (has no administration in time range)  vancomycin (VANCOREADY) IVPB 750 mg/150 mL (has no administration in time range)  insulin glargine-yfgn (SEMGLEE)  injection 20 Units (has no administration in time range)  iohexol (OMNIPAQUE) 300 MG/ML solution 100 mL (100 mLs Intravenous Contrast Given 03/04/22 1846)  sodium chloride 0.9 % bolus 1,000 mL (0 mLs Intravenous Stopped 03/05/22 0553)  fentaNYL (SUBLIMAZE) injection 50 mcg (50 mcg Intravenous Given 03/05/22 0349)  ondansetron (ZOFRAN) injection 4 mg (4 mg Intravenous Given 03/05/22 0348)  vancomycin (VANCOREADY) IVPB 1500 mg/300 mL (0 mg Intravenous Stopped 03/05/22 0642)  lidocaine-EPINEPHrine (XYLOCAINE W/EPI) 2 %-1:200000 (PF) injection 20 mL (20 mLs Intradermal Given 03/05/22 0547)    ED Course/ Medical Decision Making/ A&P Clinical Course as of 03/05/22 0728  Mon Mar 05, 2022  0318 Comprehensive metabolic panel(!) [AH]  0318 Glucose(!): 198 [AH]  0318 CBC with Differential(!) [AH]  0318 WBC(!): 14.9 [AH]    Clinical Course User Index [AH] Arthor Captain, PA-C                           Medical Decision Making Patient here with abscess and cellulitis of the R medial thigh. She has been on 3 days of clindamycin 300 mg TID without improvement.  I reviewed the patient's labs.  She has elevated blood glucose of 191, CBC with elevated white blood cell count. I visualized and interpreted CT pelvis with contrast.  It shows cellulitis without fluid collection.  I did a repeat I&D of the area since the initial incision was quite small and there was still copious purulence after removing the bandage however after incision and drainage there was not any further loculation or significant amount of pus retained.  Therefore I feel the patient has failed outpatient antibiotics and will need admission.  Case discussed with Dr. Arlean Hopping who will admit the patient.  Amount and/or Complexity of Data Reviewed Labs:  Decision-making details documented in ED Course. Radiology: independent interpretation performed.  Risk Prescription drug management. Decision regarding hospitalization.    Final Clinical  Impression(s) / ED Diagnoses Final diagnoses:  Cellulitis of right lower extremity    Rx / DC Orders ED Discharge Orders     None         Arthor Captain, PA-C 03/05/22 9326    Zadie Rhine, MD 03/05/22 2308

## 2022-03-06 DIAGNOSIS — L03115 Cellulitis of right lower limb: Secondary | ICD-10-CM | POA: Diagnosis not present

## 2022-03-06 DIAGNOSIS — L02415 Cutaneous abscess of right lower limb: Secondary | ICD-10-CM | POA: Diagnosis not present

## 2022-03-06 DIAGNOSIS — D649 Anemia, unspecified: Secondary | ICD-10-CM | POA: Diagnosis not present

## 2022-03-06 LAB — BASIC METABOLIC PANEL
Anion gap: 9 (ref 5–15)
BUN: 5 mg/dL — ABNORMAL LOW (ref 6–20)
CO2: 21 mmol/L — ABNORMAL LOW (ref 22–32)
Calcium: 8.1 mg/dL — ABNORMAL LOW (ref 8.9–10.3)
Chloride: 107 mmol/L (ref 98–111)
Creatinine, Ser: 0.81 mg/dL (ref 0.44–1.00)
GFR, Estimated: >60 mL/min (ref >60)
Glucose, Bld: 172 mg/dL — ABNORMAL HIGH (ref 70–99)
Potassium: 4.6 mmol/L (ref 3.5–5.1)
Sodium: 137 mmol/L (ref 135–145)

## 2022-03-06 LAB — GLUCOSE, CAPILLARY
Glucose-Capillary: 150 mg/dL — ABNORMAL HIGH (ref 70–99)
Glucose-Capillary: 146 mg/dL — ABNORMAL HIGH (ref 70–99)
Glucose-Capillary: 180 mg/dL — ABNORMAL HIGH (ref 70–99)
Glucose-Capillary: 113 mg/dL — ABNORMAL HIGH (ref 70–99)

## 2022-03-06 LAB — CBC
HCT: 27.4 % — ABNORMAL LOW (ref 36.0–46.0)
Hemoglobin: 8.3 g/dL — ABNORMAL LOW (ref 12.0–15.0)
MCH: 26 pg (ref 26.0–34.0)
MCHC: 30.3 g/dL (ref 30.0–36.0)
MCV: 85.9 fL (ref 80.0–100.0)
Platelets: 574 10*3/uL — ABNORMAL HIGH (ref 150–400)
RBC: 3.19 MIL/uL — ABNORMAL LOW (ref 3.87–5.11)
RDW: 15.7 % — ABNORMAL HIGH (ref 11.5–15.5)
WBC: 10.7 10*3/uL — ABNORMAL HIGH (ref 4.0–10.5)
nRBC: 0 % (ref 0.0–0.2)

## 2022-03-06 NOTE — Progress Notes (Signed)
[facility]                                                                              Connie Williamson, is a 26 y.o. female, DOB - [birth year], WFU:[id number] Admit date - 03/04/2022    Outpatient Primary MD for the patient is Glendale Chard, MD  LOS - 1  days  Chief Complaint  Patient presents with   Abscess       Brief summary   Patient is a 26 year old female with Crohn's disease, diabetes mellitus presented to ED with right thigh cellulitis, worsening pain and purulent discharge.  Patient reported that 1 week ago she started having swelling in the upper medial right thigh with increasing pain, swelling and drainage.  She went to the urgent care on 8/25, I&D was done with packing.  Patient was placed on oral clindamycin.  Patient reports she followed up in urgent care on Sunday, 8/27 for removal of the packing material however she had progression of the redness tenderness, increased warmth and purulent drainage.  She was then prompted to the ED.  No fevers or chills. In ED, temp 98.4, pulse 104, RR 16, BP 119/76-> 105/67, leukocytosis WBCs 12.3.  CT pelvis did not show residual drainable collection. Repeat I&D was done by EDP, per documentation, there was still copious purulence after removing the bandage. Patient was admitted for further work-up  Assessment & Plan    Principal Problem: Sepsis due to abscess and cellulitis of right upper medial thigh with outpatient failure to treatment -Met sepsis criteria with leukocytosis, tachycardia, hypotension, source secondary to abscess right thigh.  Patient was on clindamycin prior to admission. -Bedside I&D done by EDP on 8/28 - Gen surgery consulted, recommended daily dressing changes and wound care. No further surgical intervention needed.  -Continue IV vancomycin - wound care consulted   Active Problems:   DM2 (diabetes mellitus, type 2) ([place]), IDDM, uncontrolled with hyperglycemia -Uncontrolled, hemoglobin A1c  10.7 on 8/28 -Continue Semglee 20 units at bedtime, sliding scale insulin sensitive Recent Labs    03/05/22 1150 03/05/22 1759 03/05/22 2249 03/06/22 0847 03/06/22 1205 03/06/22 1521  GLUCAP 85 177* 204* 150* 146* 180*    - add Novolog 3units TID AC    Acute normocytic anemia -Baseline hemoglobin around 12.9, hemoglobin 10.2 on admission -Hemoglobin  8.3, no acute bleeding     Crohn's disease ([place]) -No acute issues related to Crohns disease    Obesity (BMI 30-39.9) Estimated body mass index is 35.77 kg/m as calculated from the following:   Height as of this encounter: 5' 3"  (1.6 m).   Weight as of this encounter: 91.6 kg.  Code Status: Full code DVT Prophylaxis:  SCDs Start: 03/05/22 0550   Level of Care: Level of care: Telemetry Medical Family Communication: Updated patient   Disposition Plan:      Remains inpatient appropriate: Continue IV antibiotics, surgery evaluation requested, will likely need 24 to 48 hours of IV antibiotics before transition to oral antibiotics for safe discharge home   Procedures:  Bedside I&D by EDP  Consultants:   General surgery  Antimicrobials:   Anti-infectives (From admission, onward)  Start     Dose/Rate Route Frequency Ordered Stop   03/05/22 1000  vancomycin (VANCOREADY) IVPB 750 mg/150 mL        750 mg 150 mL/hr over 60 Minutes Intravenous Every 12 hours 03/05/22 0607     03/05/22 0145  vancomycin (VANCOREADY) IVPB 1500 mg/300 mL        1,500 mg 150 mL/hr over 120 Minutes Intravenous  Once 03/05/22 0135 03/05/22 0642          Medications  insulin aspart  0-9 Units Subcutaneous TID WC   insulin glargine-yfgn  20 Units Subcutaneous QHS      Subjective:   Connie Williamson was seen and examined today. Feels better today, still has tenderness and induration in right groin. No fevers.   Objective:   Vitals:   03/06/22 0112 03/06/22 0518 03/06/22 0846 03/06/22 1520  BP: 125/78 115/66 115/72 (!) 97/59   Pulse: 97 97 97 85  Resp: 18 16 17 16   Temp: 98.8 F (37.1 C) 98.3 F (36.8 C) 98.6 F (37 C) 99.4 F (37.4 C)  TempSrc: Oral  Oral Oral  SpO2: 98% 97% 100% 92%  Weight:  91.2 kg 91.6 kg   Height:        Intake/Output Summary (Last 24 hours) at 03/06/2022 1543 Last data filed at 03/06/2022 1207 Gross per 24 hour  Intake 1240 ml  Output --  Net 1240 ml     Wt Readings from Last 3 Encounters:  03/06/22 91.6 kg  11/24/20 109.1 kg  02/03/20 (!) 101.6 kg    Physical Exam General: Alert and oriented x 3, NAD Cardiovascular: S1 S2 clear, RRR.  Respiratory: CTAB, no wheezing, rales or rhonchi Gastrointestinal: Soft, nontender, nondistended, NBS Ext: no pedal edema bilaterally Neuro: no new deficits Skin: right proximal medial thigh wound, no active drainage      Data Reviewed:  I have personally reviewed following labs    CBC Lab Results  Component Value Date   WBC 10.7 (H) 03/06/2022   RBC 3.19 (L) 03/06/2022   HGB 8.3 (L) 03/06/2022   HCT 27.4 (L) 03/06/2022   MCV 85.9 03/06/2022   MCH 26.0 03/06/2022   PLT 574 (H) 03/06/2022   MCHC 30.3 03/06/2022   RDW 15.7 (H) 03/06/2022   LYMPHSABS 1.9 03/05/2022   MONOABS 1.0 03/05/2022   EOSABS 0.4 03/05/2022   BASOSABS 0.1 94/85/4627     Last metabolic panel Lab Results  Component Value Date   NA 137 03/06/2022   K 4.6 03/06/2022   CL 107 03/06/2022   CO2 21 (L) 03/06/2022   BUN 5 (L) 03/06/2022   CREATININE 0.81 03/06/2022   GLUCOSE 172 (H) 03/06/2022   GFRNONAA >60 03/06/2022   GFRAA 130 12/02/2019   CALCIUM 8.1 (L) 03/06/2022   PROT 6.6 03/05/2022   ALBUMIN 2.4 (L) 03/05/2022   LABGLOB 3.2 11/24/2020   AGRATIO 1.3 11/24/2020   BILITOT 0.5 03/05/2022   ALKPHOS 86 03/05/2022   AST 7 (L) 03/05/2022   ALT 9 03/05/2022   ANIONGAP 9 03/06/2022    CBG (last 3)  Recent Labs    03/06/22 0847 03/06/22 1205 03/06/22 1521  GLUCAP 150* 146* 180*      Coagulation Profile: Recent Labs  Lab  03/05/22 0800  INR 1.2     Radiology Studies: I have personally reviewed the imaging studies  CT PELVIS W CONTRAST  Result Date: 03/04/2022 CLINICAL DATA:  Right groin abscess, upper thigh swelling, status post I&D on Friday IMPRESSION:  Cellulitis in the medial right upper thigh with postprocedural changes. No drainable fluid collection/abscess. Thick-walled bladder, correlate for cystitis. Electronically Signed   By: Julian Hy M.D.   On: 03/04/2022 18:57       Barbara Ahart M.D. [facility] 03/06/2022, 3:43 PM  Available via Epic secure chat 7am-7pm After 7 pm, please refer to night coverage provider listed on amion.

## 2022-03-06 NOTE — Consult Note (Addendum)
WOC Nurse Consult Note: Reason for Consult: wound care Patient with right thigh abcess that was I&D last week, patient returned to hospital because of "worsening of the area"  Surgery evaluated this patient and recommended daily wound care, verifying with surgery for updated orders  Wound type: surgical  Pressure Injury POA: NA Measurement: see nursing flow sheets  Wound bed: no acute issues per surgery notes Drainage (amount, consistency, odor) moderate, serosanguinous  Periwound: intact  Dressing procedure/placement/frequency: Dry dressing per general surgery  Change daily. Teach patient or CG to perform  Discussed POC with patient and bedside nurse.  Re consult if needed, will not follow at this time. Thanks  Citlali Gautney [employer], RN,CWOCN, CNS, CWON-AP [phone])

## 2022-03-07 ENCOUNTER — Encounter: Payer: Self-pay | Admitting: Family Medicine

## 2022-03-07 DIAGNOSIS — L03115 Cellulitis of right lower limb: Secondary | ICD-10-CM | POA: Diagnosis not present

## 2022-03-07 LAB — CBC
HCT: 28.3 % — ABNORMAL LOW (ref 36.0–46.0)
Hemoglobin: 8.3 g/dL — ABNORMAL LOW (ref 12.0–15.0)
MCH: 25.6 pg — ABNORMAL LOW (ref 26.0–34.0)
MCHC: 29.3 g/dL — ABNORMAL LOW (ref 30.0–36.0)
MCV: 87.3 fL (ref 80.0–100.0)
Platelets: 594 10*3/uL — ABNORMAL HIGH (ref 150–400)
RBC: 3.24 MIL/uL — ABNORMAL LOW (ref 3.87–5.11)
RDW: 15.9 % — ABNORMAL HIGH (ref 11.5–15.5)
WBC: 12.3 10*3/uL — ABNORMAL HIGH (ref 4.0–10.5)
nRBC: 0.2 % (ref 0.0–0.2)

## 2022-03-07 LAB — BASIC METABOLIC PANEL
Anion gap: 11 (ref 5–15)
BUN: 5 mg/dL — ABNORMAL LOW (ref 6–20)
CO2: 25 mmol/L (ref 22–32)
Calcium: 8.5 mg/dL — ABNORMAL LOW (ref 8.9–10.3)
Chloride: 103 mmol/L (ref 98–111)
Creatinine, Ser: 0.65 mg/dL (ref 0.44–1.00)
GFR, Estimated: >60 mL/min (ref >60)
Glucose, Bld: 166 mg/dL — ABNORMAL HIGH (ref 70–99)
Potassium: 3.6 mmol/L (ref 3.5–5.1)
Sodium: 139 mmol/L (ref 135–145)

## 2022-03-07 LAB — GLUCOSE, CAPILLARY
Glucose-Capillary: 137 mg/dL — ABNORMAL HIGH (ref 70–99)
Glucose-Capillary: 149 mg/dL — ABNORMAL HIGH (ref 70–99)

## 2022-03-07 MED ORDER — CLINDAMYCIN HCL 300 MG PO CAPS: 300.0000 mg | ORAL_CAPSULE | Freq: Four times a day (QID) | ORAL | 0 refills | Status: AC

## 2022-03-07 MED ORDER — CLINDAMYCIN HCL 300 MG PO CAPS
300.0000 mg | ORAL_CAPSULE | Freq: Four times a day (QID) | ORAL | Status: DC
  Administered 2022-03-07: 300 mg via ORAL
  Filled 2022-03-07: qty 1

## 2022-03-07 NOTE — Progress Notes (Signed)
Discharge orders placed. AVS printed and reviewed with patient. Reviewed wound care instructions, patient demonstrated understanding by performing dressing change with instruction from writer. Patient given first dose of oral ABX prior to discharge. Peripheral IV removed. All questions.concerns addressed. Patient to be discharged home via private auto

## 2022-03-07 NOTE — Discharge Instructions (Signed)
Dry dressing over wound and change daily. Remove dressing to shower with wound uncovered

## 2022-03-07 NOTE — Discharge Summary (Signed)
Physician Discharge Summary  Connie Williamson IEP:[id number] DOB: Dec 14, 1995 DOA: 03/04/2022  PCP: No primary care provider on file.  Admit date: 03/04/2022 Discharge date: 03/07/2022  Time spent: 37 minutes  Recommendations for Outpatient Follow-up:  Complete clindamycin 6 days more dosing Recommend pain control not present first choice, oxycodone limited prescription given on discharge  Discharge Diagnoses:  MAIN problem for hospitalization   Right upper inner thigh abscess  Please see below for itemized issues addressed in HOpsital- refer to other progress notes for clarity if needed  Discharge Condition: Improved  Diet recommendation: Diabetic  Filed Weights   03/06/22 0518 03/06/22 0846 03/07/22 0500  Weight: 91.2 kg 91.6 kg 92.3 kg    History of present illness:  26 year old female known Crohn's diabetes since age 83 Developed a boil in her groin about 10 days prior to admission was given clindamycin and took 1 dose of it Went to urgent care 8/25 and I&D was done with packing with follow-up but she had progression of redness and tenderness on 8/27 and was sent to the ED Found to have WBC 12 CT did not show any residual drainable abscess  Repeat I&D was done by EDP  Patient was admitted with sepsis due to abscess cellulitis upper medial thigh General surgery was consulted did not feel any surgical intervention was required Patient was placed on dry dressings Diabetes was moderately controlled during hospital stay on 20 units of Semglee and she was increased back to 30 units Her Crohn's disease was stable during hospitalization  Ultimately it was felt that she would benefit from completion of 6 more days of clindamycin in the outpatient setting and she was resumed on her home meds she was given a limited prescription of opiates and she was discharged home   Discharge Exam: Vitals:   03/07/22 0505 03/07/22 0731  BP: 105/65 110/67  Pulse: 93 95  Resp: 16 16   Temp: 98 F (36.7 C) 98.1 F (36.7 C)  SpO2: 98% 100%    Subj on day of d/c   Awake coherent no distress Pain is moderate she does still have some swelling  General Exam on discharge  EOMI NCAT no focal deficit Chest clear no added sound abdomen soft no rebound no guarding No rales rhonchi wheeze Groin exam she does have some induration but no fluctuance on the medial upper right thigh-I examined her with a chaperone She is able to move all 4 limbs and although there is some redness it does not seem to be spreading anywhere   Discharge Instructions   Discharge Instructions     Diet - low sodium heart healthy   Complete by: As directed    Discharge instructions   Complete by: As directed    You will need to complete 6 more days of clindamycin--finish all the tablets.  IF u have further pain of see discharge from the area, go see primary care.  We will call in Oxycodone for pain control--first choice please use Naproxen  Dressings should be with dry dressings daily   Discharge wound care:   Complete by: As directed    As above   Increase activity slowly   Complete by: As directed       Allergies as of 03/07/2022   No Known Allergies      Medication List     TAKE these medications    clindamycin 300 MG capsule Commonly known as: CLEOCIN Take 1 capsule (300 mg total) by mouth 4 (four) times  daily for 6 days.   HYDROcodone-acetaminophen 5-325 MG tablet Commonly known as: NORCO/VICODIN Take 1 tablet by mouth every 4 (four) hours as needed for moderate pain or severe pain.   ibuprofen 200 MG tablet Commonly known as: ADVIL Take 400 mg by mouth 2 (two) times daily as needed (pain).   metFORMIN 500 MG tablet Commonly known as: GLUCOPHAGE Take 500 mg by mouth daily.   ondansetron 4 MG disintegrating tablet Commonly known as: Zofran ODT Take 1 tablet (4 mg total) by mouth every 8 (eight) hours as needed for nausea.   Pen Needles 32G X 4 MM Misc Use as  directed with insulin pens   Tresiba 100 UNIT/ML Soln Generic drug: Insulin Degludec Inject 30 Units into the skin at bedtime. What changed: Another medication with the same name was removed. Continue taking this medication, and follow the directions you see here.   cholecalciferol 25 MCG (1000 UNIT) tablet Commonly known as: VITAMIN D3 Take 1,000 Units by mouth daily.   Vitamin D3 1.25 MG (50000 UT) Caps Take 1 capsule by mouth on Tuesday and Friday               Discharge Care Instructions  (From admission, onward)           Start     Ordered   03/07/22 0000  Discharge wound care:       Comments: As above   03/07/22 1152           No Known Allergies    The results of significant diagnostics from this hospitalization (including imaging, microbiology, ancillary and laboratory) are listed below for reference.    Significant Diagnostic Studies: CT PELVIS W CONTRAST  Result Date: 03/04/2022 CLINICAL DATA:  Right groin abscess, upper thigh swelling, status post I&D on Friday EXAM: CT PELVIS WITH CONTRAST TECHNIQUE: Multidetector CT imaging of the pelvis was performed using the standard protocol following the bolus administration of intravenous contrast. RADIATION DOSE REDUCTION: This exam was performed according to the departmental dose-optimization program which includes automated exposure control, adjustment of the mA and/or kV according to patient size and/or use of iterative reconstruction technique. CONTRAST:  OMNIPAQUE IOHEXOL 300 MG/ML  SOLN COMPARISON:  CT abdomen/pelvis dated 01/25/2015 FINDINGS: Urinary Tract:  Thick-walled bladder. Bowel: Status post ileocecal resection with suture line in the right lower quadrant. Additional small bowel suture line in the left lower quadrant. Vascular/Lymphatic: No evidence of aneurysm. No suspicious pelvic lymphadenopathy. Small right inguinal nodes measuring up to 12 mm short axis (series 5/image 36), likely reactive.  Reproductive:  Uterus is within normal limits. Right ovary is notable for a dominant 3.6 cm cyst/follicle, simple/physiologic. No follow-up is recommended. Left ovary is within normal limits. Other:  Trace pelvic fluid, physiologic. Musculoskeletal: Subcutaneous stranding in the medial right upper thigh/lower groin (series 5/image 114), suggesting cellulitis. Tiny focus of subcutaneous gas is likely related to recent intervention (series 5/image 108). No drainable fluid collection/abscess. Visualized osseous structures are within normal limits. IMPRESSION: Cellulitis in the medial right upper thigh with postprocedural changes. No drainable fluid collection/abscess. Thick-walled bladder, correlate for cystitis. Electronically Signed   By: Charline Bills M.D.   On: 03/04/2022 18:57    Microbiology: No results found for this or any previous visit (from the past 240 hour(s)).   Labs: Basic Metabolic Panel: Recent Labs  Lab 03/04/22 1620 03/04/22 1655 03/04/22 1755 03/05/22 0800 03/06/22 0119 03/07/22 0334  NA 135 136 136 139 137 139  K 4.2 4.2 4.2  3.8 4.6 3.6  CL 99 98 98 106 107 103  CO2 26  --   --  24 21* 25  GLUCOSE 198* 197* 191* 138* 172* 166*  BUN 6 6 6  <5* 5* 5*  CREATININE 0.71 0.60 0.60 0.73 0.81 0.65  CALCIUM 8.6*  --   --  8.2* 8.1* 8.5*  MG  --   --   --  1.8  --   --    Liver Function Tests: Recent Labs  Lab 03/04/22 1620 03/05/22 0800  AST 16 7*  ALT 8 9  ALKPHOS 99 86  BILITOT 0.5 0.5  PROT 7.2 6.6  ALBUMIN 2.8* 2.4*   No results for input(s): "LIPASE", "AMYLASE" in the last 168 hours. No results for input(s): "AMMONIA" in the last 168 hours. CBC: Recent Labs  Lab 03/04/22 1620 03/04/22 1655 03/04/22 1755 03/05/22 0800 03/06/22 0345 03/07/22 0334  WBC 14.9*  --   --  12.3* 10.7* 12.3*  NEUTROABS 10.9*  --   --  8.9*  --   --   HGB 10.2* 11.6* 13.3 9.1* 8.3* 8.3*  HCT 33.6* 34.0* 39.0 30.4* 27.4* 28.3*  MCV 85.7  --   --  87.4 85.9 87.3  PLT 661*  --    --  579* 574* 594*   Cardiac Enzymes: No results for input(s): "CKTOTAL", "CKMB", "CKMBINDEX", "TROPONINI" in the last 168 hours. BNP: BNP (last 3 results) No results for input(s): "BNP" in the last 8760 hours.  ProBNP (last 3 results) No results for input(s): "PROBNP" in the last 8760 hours.  CBG: Recent Labs  Lab 03/06/22 1205 03/06/22 1521 03/06/22 2010 03/07/22 0755 03/07/22 1142  GLUCAP 146* 180* 113* 137* 149*       Signed:  03/09/22 MD   [facility] 03/07/2022, 12:07 PM

## 2022-10-11 DIAGNOSIS — N920 Excessive and frequent menstruation with regular cycle: Secondary | ICD-10-CM | POA: Insufficient documentation

## 2022-10-29 DIAGNOSIS — D251 Intramural leiomyoma of uterus: Secondary | ICD-10-CM | POA: Insufficient documentation

## 2022-11-05 ENCOUNTER — Non-Acute Institutional Stay (HOSPITAL_COMMUNITY)
Admission: RE | Admit: 2022-11-05 | Discharge: 2022-11-05 | Disposition: A | Discharge status: Home / Self Care | Payer: No Typology Code available for payment source | Source: Ambulatory Visit | Attending: Internal Medicine | Admitting: Internal Medicine

## 2022-11-05 DIAGNOSIS — D509 Iron deficiency anemia, unspecified: Secondary | ICD-10-CM | POA: Diagnosis present

## 2022-11-05 MED ORDER — SODIUM CHLORIDE 0.9 % IV SOLN
510.0000 mg | Freq: Once | INTRAVENOUS | Status: AC
  Administered 2022-11-05: 510 mg via INTRAVENOUS
  Filled 2022-11-05: qty 17

## 2022-11-05 MED ORDER — SODIUM CHLORIDE 0.9 % IV SOLN: INTRAVENOUS | Status: DC | PRN

## 2022-11-05 NOTE — Progress Notes (Signed)
PATIENT CARE CENTER NOTE  Diagnosis: ICD-10: D50.9: Iron Deficiency Anemia, unspecified   Provider: Carrington Clamp MD  Procedure: Feraheme 510 mg (dose #1 of 1)  Note: Patient received Feraheme 510 mg infusion via PIV. Pt tolerated infusion with no adverse reaction. Pt observed for 30 minutes post infusion. Vital signs stable. AVS given to pt. Pt is alert, oriented, and ambulatory at discharge.Marland Kitchen

## 2022-12-11 ENCOUNTER — Encounter (HOSPITAL_BASED_OUTPATIENT_CLINIC_OR_DEPARTMENT_OTHER): Payer: Self-pay

## 2022-12-11 ENCOUNTER — Emergency Department (HOSPITAL_BASED_OUTPATIENT_CLINIC_OR_DEPARTMENT_OTHER)
Admission: EM | Admit: 2022-12-11 | Discharge: 2022-12-11 | Disposition: A | Discharge status: Home / Self Care | Payer: No Typology Code available for payment source | Attending: Emergency Medicine | Admitting: Emergency Medicine

## 2022-12-11 ENCOUNTER — Other Ambulatory Visit: Payer: Self-pay

## 2022-12-11 ENCOUNTER — Emergency Department (HOSPITAL_BASED_OUTPATIENT_CLINIC_OR_DEPARTMENT_OTHER): Payer: No Typology Code available for payment source

## 2022-12-11 DIAGNOSIS — R000 Tachycardia, unspecified: Secondary | ICD-10-CM | POA: Insufficient documentation

## 2022-12-11 DIAGNOSIS — Z1152 Encounter for screening for COVID-19: Secondary | ICD-10-CM | POA: Diagnosis not present

## 2022-12-11 DIAGNOSIS — Z794 Long term (current) use of insulin: Secondary | ICD-10-CM | POA: Insufficient documentation

## 2022-12-11 DIAGNOSIS — Z7984 Long term (current) use of oral hypoglycemic drugs: Secondary | ICD-10-CM | POA: Diagnosis not present

## 2022-12-11 DIAGNOSIS — R197 Diarrhea, unspecified: Secondary | ICD-10-CM | POA: Diagnosis not present

## 2022-12-11 DIAGNOSIS — E1165 Type 2 diabetes mellitus with hyperglycemia: Secondary | ICD-10-CM

## 2022-12-11 DIAGNOSIS — R3589 Other polyuria: Secondary | ICD-10-CM | POA: Diagnosis present

## 2022-12-11 LAB — URINALYSIS, ROUTINE W REFLEX MICROSCOPIC
Bacteria, UA: NONE SEEN
Bilirubin Urine: NEGATIVE
Glucose, UA: >1000 mg/dL — AB
Ketones, ur: 40 mg/dL — AB
Leukocytes,Ua: NEGATIVE
Nitrite: NEGATIVE
Protein, ur: NEGATIVE mg/dL
RBC / HPF: >50 RBC/hpf (ref 0–5)
Specific Gravity, Urine: 1.037 — ABNORMAL HIGH (ref 1.005–1.030)
pH: 6.5 (ref 5.0–8.0)

## 2022-12-11 LAB — CBC WITH DIFFERENTIAL/PLATELET
Abs Immature Granulocytes: 0.04 10*3/uL (ref 0.00–0.07)
Basophils Absolute: 0.1 10*3/uL (ref 0.0–0.1)
Basophils Relative: 1 %
Eosinophils Absolute: 0.1 10*3/uL (ref 0.0–0.5)
Eosinophils Relative: 2 %
HCT: 38.4 % (ref 36.0–46.0)
Hemoglobin: 12 g/dL (ref 12.0–15.0)
Immature Granulocytes: 0 %
Lymphocytes Relative: 21 %
Lymphs Abs: 2 10*3/uL (ref 0.7–4.0)
MCH: 22.7 pg — ABNORMAL LOW (ref 26.0–34.0)
MCHC: 31.3 g/dL (ref 30.0–36.0)
MCV: 72.7 fL — ABNORMAL LOW (ref 80.0–100.0)
Monocytes Absolute: 0.5 10*3/uL (ref 0.1–1.0)
Monocytes Relative: 6 %
Neutro Abs: 6.6 10*3/uL (ref 1.7–7.7)
Neutrophils Relative %: 70 %
Platelets: 418 10*3/uL — ABNORMAL HIGH (ref 150–400)
RBC: 5.28 MIL/uL — ABNORMAL HIGH (ref 3.87–5.11)
RDW: 24.6 % — ABNORMAL HIGH (ref 11.5–15.5)
WBC: 9.3 10*3/uL (ref 4.0–10.5)
nRBC: 0 % (ref 0.0–0.2)

## 2022-12-11 LAB — BASIC METABOLIC PANEL
Anion gap: 14 (ref 5–15)
Anion gap: 13 (ref 5–15)
BUN: 11 mg/dL (ref 6–20)
BUN: 11 mg/dL (ref 6–20)
CO2: 18 mmol/L — ABNORMAL LOW (ref 22–32)
CO2: 20 mmol/L — ABNORMAL LOW (ref 22–32)
Calcium: 9.1 mg/dL (ref 8.9–10.3)
Calcium: 8.7 mg/dL — ABNORMAL LOW (ref 8.9–10.3)
Chloride: 93 mmol/L — ABNORMAL LOW (ref 98–111)
Chloride: 95 mmol/L — ABNORMAL LOW (ref 98–111)
Creatinine, Ser: 0.81 mg/dL (ref 0.44–1.00)
Creatinine, Ser: 0.63 mg/dL (ref 0.44–1.00)
GFR, Estimated: >60 mL/min (ref >60)
GFR, Estimated: >60 mL/min (ref >60)
Glucose, Bld: 542 mg/dL (ref 70–99)
Glucose, Bld: 485 mg/dL — ABNORMAL HIGH (ref 70–99)
Potassium: 4.9 mmol/L (ref 3.5–5.1)
Potassium: 4.2 mmol/L (ref 3.5–5.1)
Sodium: 125 mmol/L — ABNORMAL LOW (ref 135–145)
Sodium: 128 mmol/L — ABNORMAL LOW (ref 135–145)

## 2022-12-11 LAB — CBG MONITORING, ED
Glucose-Capillary: 551 mg/dL (ref 70–99)
Glucose-Capillary: 457 mg/dL — ABNORMAL HIGH (ref 70–99)
Glucose-Capillary: 367 mg/dL — ABNORMAL HIGH (ref 70–99)
Glucose-Capillary: 342 mg/dL — ABNORMAL HIGH (ref 70–99)

## 2022-12-11 LAB — I-STAT VENOUS BLOOD GAS, ED
Acid-base deficit: 2 mmol/L (ref 0.0–2.0)
Bicarbonate: 21.7 mmol/L (ref 20.0–28.0)
Calcium, Ion: 1.19 mmol/L (ref 1.15–1.40)
HCT: 37 % (ref 36.0–46.0)
Hemoglobin: 12.6 g/dL (ref 12.0–15.0)
O2 Saturation: 89 %
Potassium: 4.3 mmol/L (ref 3.5–5.1)
Sodium: 126 mmol/L — ABNORMAL LOW (ref 135–145)
TCO2: 23 mmol/L (ref 22–32)
pCO2, Ven: 34 mmHg — ABNORMAL LOW (ref 44–60)
pH, Ven: 7.411 (ref 7.25–7.43)
pO2, Ven: 54 mmHg — ABNORMAL HIGH (ref 32–45)

## 2022-12-11 LAB — SARS CORONAVIRUS 2 BY RT PCR: SARS Coronavirus 2 by RT PCR: NEGATIVE

## 2022-12-11 LAB — BETA-HYDROXYBUTYRIC ACID: Beta-Hydroxybutyric Acid: 2.15 mmol/L — ABNORMAL HIGH (ref 0.05–0.27)

## 2022-12-11 LAB — PREGNANCY, URINE: Preg Test, Ur: NEGATIVE

## 2022-12-11 MED ORDER — INSULIN DEGLUDEC 100 UNIT/ML ~~LOC~~ SOLN: 30.0000 [IU] | Freq: Every evening | SUBCUTANEOUS | 0 refills | Status: DC

## 2022-12-11 MED ORDER — LACTATED RINGERS IV BOLUS
2000.0000 mL | Freq: Once | INTRAVENOUS | Status: AC
  Administered 2022-12-11: 2000 mL via INTRAVENOUS

## 2022-12-11 MED ORDER — INSULIN REGULAR HUMAN 100 UNIT/ML IJ SOLN
10.0000 [IU] | Freq: Once | INTRAMUSCULAR | Status: DC
  Filled 2022-12-11: qty 3

## 2022-12-11 MED ORDER — LACTATED RINGERS IV SOLN: INTRAVENOUS | Status: DC

## 2022-12-11 MED ORDER — INSULIN ASPART PROT & ASPART (70-30 MIX) 100 UNIT/ML ~~LOC~~ SUSP: 10.0000 [IU] | Freq: Once | SUBCUTANEOUS | Status: DC

## 2022-12-11 MED ORDER — LACTATED RINGERS IV BOLUS
1000.0000 mL | Freq: Once | INTRAVENOUS | Status: AC
  Administered 2022-12-11: 1000 mL via INTRAVENOUS

## 2022-12-11 MED ORDER — INSULIN ASPART 100 UNIT/ML IJ SOLN
10.0000 [IU] | Freq: Once | INTRAMUSCULAR | Status: AC
  Administered 2022-12-11: 10 [IU] via SUBCUTANEOUS

## 2022-12-11 MED ORDER — METFORMIN HCL 500 MG PO TABS: 500.0000 mg | ORAL_TABLET | Freq: Two times a day (BID) | ORAL | 0 refills | Status: DC

## 2022-12-11 MED ORDER — LACTATED RINGERS IV BOLUS: 1000.0000 mL | Freq: Once | INTRAVENOUS | Status: DC

## 2022-12-11 MED ORDER — TRESIBA FLEXTOUCH 100 UNIT/ML ~~LOC~~ SOPN: 30.0000 [IU] | PEN_INJECTOR | Freq: Every evening | SUBCUTANEOUS | 0 refills | Status: DC

## 2022-12-11 MED ORDER — PEN NEEDLES 32G X 4 MM MISC: 0 refills | Status: DC

## 2022-12-11 NOTE — ED Notes (Signed)
Provider notified of cbg results

## 2022-12-11 NOTE — ED Notes (Signed)
Per EDP Lawsing, recheck CBG after IV fluids before administering insulin and inform EDP to determine if insulin is needed.

## 2022-12-11 NOTE — ED Notes (Signed)
Reviewed AVS/discharge instruction with patient. Time allotted for and all questions answered. Patient is agreeable for d/c and escorted to ed exit by staff.  

## 2022-12-11 NOTE — ED Provider Notes (Signed)
[place] EMERGENCY DEPARTMENT AT [facility] Provider Note   CSN: [id number] Arrival date & time: 12/11/22  1525     History  Chief Complaint  Patient presents with   Hyperglycemia    Connie Williamson is a 27 y.o. female.   Hyperglycemia Associated symptoms: increased thirst and polyuria      27 year old female with medical history significant for Crohn's disease follows outpatient with gastroenterology, diabetes mellitus type 2 not on therapy for the past month due to insurance concerns who presents to the emergency department with polyuria and polydipsia.  The patient states that she has had some diarrhea over the past few days, she denies any nausea or vomiting or abdominal pain.  She endorses polyuria and polydipsia and her blood glucose was reading high above 600 after her gastroenterology appointment and she was advised to present to the emergency department for evaluation for possible DKA.  Has any fevers or chills.  She denies any dysuria.  She states that she has been off of insulin because she lost her insurance for short period of time.  She does not have a primary care provider.  Home Medications Prior to Admission medications   Medication Sig Start Date End Date Taking? Authorizing Provider  cholecalciferol (VITAMIN D3) 25 MCG (1000 UNIT) tablet Take 1,000 Units by mouth daily.   Yes [provider]  ibuprofen (ADVIL,MOTRIN) 200 MG tablet Take 400 mg by mouth 2 (two) times daily as needed (pain).   Yes [provider]  MYFEMBREE 40-1-0.5 MG TABS Take 1 tablet by mouth daily. 10/29/22  Yes [provider]  SYEDA 3-0.03 MG tablet Take 1 tablet by mouth daily. 10/29/22  Yes [provider]  Cholecalciferol (VITAMIN D3) 1.25 MG (50000 UT) CAPS Take 1 capsule by mouth on Tuesday and Friday Patient not taking: Reported on 03/05/2022 11/28/20   Charlesetta Ivory, NP  Insulin Degludec (TRESIBA) 100 UNIT/ML SOLN Inject 30 Units into  the skin at bedtime. 12/11/22 02/09/23 Yes Ernie Avena, MD  Insulin Pen Needle (PEN NEEDLES) 32G X 4 MM MISC Use as directed with insulin pens 11/22/20   Dorothyann Peng, MD  metFORMIN (GLUCOPHAGE) 500 MG tablet Take 1 tablet (500 mg total) by mouth 2 (two) times daily with a meal. 12/11/22 02/09/23 Yes Ernie Avena, MD  ondansetron (ZOFRAN ODT) 4 MG disintegrating tablet Take 1 tablet (4 mg total) by mouth every 8 (eight) hours as needed for nausea. Patient not taking: Reported on 03/05/2022 01/25/15   Francee Piccolo, PA-C      Allergies    Patient has no known allergies.    Review of Systems   Review of Systems  Gastrointestinal:  Positive for diarrhea.  Endocrine: Positive for polydipsia and polyuria.  All other systems reviewed and are negative.   Physical Exam Updated Vital Signs BP 117/72 (BP Location: Right Arm)   Pulse 93   Temp 98.7 F (37.1 C) (Oral)   Resp 16   Ht 5\' 3"  (1.6 m)   Wt 92.3 kg   SpO2 100%   BMI 36.05 kg/m  Physical Exam Vitals and nursing note reviewed.  Constitutional:      General: She is not in acute distress.    Appearance: She is well-developed. She is not ill-appearing.  HENT:     Head: Normocephalic and atraumatic.     Mouth/Throat:     Mouth: Mucous membranes are dry.  Eyes:     Conjunctiva/sclera: Conjunctivae normal.  Cardiovascular:     Rate  and Rhythm: Regular rhythm. Tachycardia present.  Pulmonary:     Effort: Pulmonary effort is normal. No respiratory distress.     Breath sounds: Normal breath sounds.  Abdominal:     Palpations: Abdomen is soft.     Tenderness: There is no abdominal tenderness.  Musculoskeletal:        General: No swelling.     Cervical back: Neck supple.  Skin:    General: Skin is warm and dry.     Capillary Refill: Capillary refill takes less than 2 seconds.  Neurological:     Mental Status: She is alert.  Psychiatric:        Mood and Affect: Mood normal.     ED Results / Procedures / Treatments    Labs (all labs ordered are listed, but only abnormal results are displayed) Labs Reviewed  CBC WITH DIFFERENTIAL/PLATELET - Abnormal; Notable for the following components:      Result Value   RBC 5.28 (*)    MCV 72.7 (*)    MCH 22.7 (*)    RDW 24.6 (*)    Platelets 418 (*)    All other components within normal limits  BASIC METABOLIC PANEL - Abnormal; Notable for the following components:   Sodium 125 (*)    Chloride 93 (*)    CO2 18 (*)    Glucose, Bld 542 (*)    All other components within normal limits  BETA-HYDROXYBUTYRIC ACID - Abnormal; Notable for the following components:   Beta-Hydroxybutyric Acid 2.15 (*)    All other components within normal limits  URINALYSIS, ROUTINE W REFLEX MICROSCOPIC - Abnormal; Notable for the following components:   Color, Urine COLORLESS (*)    Specific Gravity, Urine 1.037 (*)    Glucose, UA >1,000 (*)    Hgb urine dipstick LARGE (*)    Ketones, ur 40 (*)    All other components within normal limits  BASIC METABOLIC PANEL - Abnormal; Notable for the following components:   Sodium 128 (*)    Chloride 95 (*)    CO2 20 (*)    Glucose, Bld 485 (*)    Calcium 8.7 (*)    All other components within normal limits  CBG MONITORING, ED - Abnormal; Notable for the following components:   Glucose-Capillary 551 (*)    All other components within normal limits  CBG MONITORING, ED - Abnormal; Notable for the following components:   Glucose-Capillary 457 (*)    All other components within normal limits  I-STAT VENOUS BLOOD GAS, ED - Abnormal; Notable for the following components:   pCO2, Ven 34.0 (*)    pO2, Ven 54 (*)    Sodium 126 (*)    All other components within normal limits  CBG MONITORING, ED - Abnormal; Notable for the following components:   Glucose-Capillary 367 (*)    All other components within normal limits  CBG MONITORING, ED - Abnormal; Notable for the following components:   Glucose-Capillary 342 (*)    All other components  within normal limits  SARS CORONAVIRUS 2 BY RT PCR  PREGNANCY, URINE    EKG None  Radiology DG Chest Portable 1 View  Result Date: 12/11/2022 CLINICAL DATA:  Shortness of breath EXAM: PORTABLE CHEST 1 VIEW COMPARISON:  Radiographs 10/13/2013 FINDINGS: The heart size and mediastinal contours are within normal limits. Both lungs are clear. The visualized skeletal structures are unremarkable. IMPRESSION: No active disease. Electronically Signed   By: Minerva Fester M.D.   On: 12/11/2022 18:11  Procedures Procedures    Medications Ordered in ED Medications  lactated ringers infusion ( Intravenous New Bag/Given 12/11/22 2002)  lactated ringers bolus 2,000 mL (0 mLs Intravenous Stopped 12/11/22 1847)  insulin aspart (novoLOG) injection 10 Units (10 Units Subcutaneous Given 12/11/22 1856)  lactated ringers bolus 1,000 mL (0 mLs Intravenous Stopped 12/11/22 2014)  insulin aspart (novoLOG) injection 10 Units (10 Units Subcutaneous Given 12/11/22 2013)    ED Course/ Medical Decision Making/ A&P Clinical Course as of 12/11/22 2139  Tue Dec 11, 2022  1544 Glucose-Capillary(!!): 551 [JL]  1640 Potassium: 4.9 [JL]  1640 CO2(!): 18 [JL]  1640 Anion gap: 14 [JL]    Clinical Course User Index [JL] Ernie Avena, MD                             Medical Decision Making Amount and/or Complexity of Data Reviewed Labs: ordered. Decision-making details documented in ED Course. Radiology: ordered.  Risk Prescription drug management.    27 year old female with medical history significant for Crohn's disease follows outpatient with gastroenterology, diabetes mellitus type 2 not on therapy for the past month due to insurance concerns who presents to the emergency department with polyuria and polydipsia.  The patient states that she has had some diarrhea over the past few days, she denies any nausea or vomiting or abdominal pain.  She endorses polyuria and polydipsia and her blood glucose was reading  high above 600 after her gastroenterology appointment and she was advised to present to the emergency department for evaluation for possible DKA.  Has any fevers or chills.  She denies any dysuria.  She states that she has been off of insulin because she lost her insurance for short period of time.  She does not have a primary care provider.  Arrival, the patient was afebrile, trend temperature 99, tachycardic heart rate 103, sinus tachycardia noted on cardiac telemetry with dry mucous membranes noted, BP 140/92, improved to 117/72.  Physical exam significant for sinus tachycardia and dry mucous membranes.  Abdomen was soft, nontender, nondistended, no rebound or guarding.  Patient presenting with a diarrheal illness and signs concerning for hyperglycemia.  He deferred differential diagnosis includes HHS and DKA.  Initial CBG revealed a blood glucose of 551.  BMP revealed Nonanion gap acidosis with a bicarbonate of 18, anion gap of 14, hyperglycemia to 542 with a pseudohyponatremia to 125.  A repeat BMP after 2 L of IV fluid resuscitation revealed improvement in the patient's blood glucose to 45, improvement in her acidosis to 20 with an anion gap of 13.  Her anion gap is closed x 2.  She was administered subcutaneous insulin in addition to additional fluid resuscitation for her uncontrolled hyperglycemia in the setting of her diabetes mellitus.  Her VBG revealed no acidosis with a pH of 7.41.  She had a urinalysis which showed no evidence of UTI and a CBC with no leukocytosis or anemia and a urine pregnancy was negative.  The patient's blood glucose continue to trend down after fluid resuscitation and additional subcutaneous insulin to 342.  She had endorsed some mild shortness of breath and a chest x-ray was unremarkable for airspace disease.  He has no chest pain, no lower extremity swelling, low suspicion for acute PE at this time, no evidence of bacterial pneumonia.  Suspect likely viral syndrome and  subsequent developing hyperglycemia that is uncontrolled in the setting of lack of outpatient insulin use for her diabetes.  On repeat assessment, the patient was well-hydrated, symptomatically improved, well-appearing, vitally stable.  I recommended that the patient follow-up with a primary care provider and the patient was assisted with registration to establish a new patient appointment.  In the meantime, will reorder her home outpatient insulin, return precautions provided in the event of any severe worsening of symptoms.   Final Clinical Impression(s) / ED Diagnoses Final diagnoses:  Hyperglycemia due to diabetes mellitus (HCC)  Diarrhea, unspecified type    Rx / DC Orders ED Discharge Orders          Ordered    metFORMIN (GLUCOPHAGE) 500 MG tablet  2 times daily with meals        12/11/22 2139    Insulin Degludec (TRESIBA) 100 UNIT/ML SOLN  Nightly        12/11/22 2139              Ernie Avena, MD 12/11/22 2139

## 2022-12-11 NOTE — Discharge Instructions (Addendum)
You are not in diabetic Ketoacidosis and your blood sugar improved significantly after fluid resuscitation and subcutaneous insulin.  As you do not have a PCP follow-up for the next month, I have ordered-year-old dosing of insulin, Tresiba 30 units nightly and have reordered your metformin.  Resume these medications and follow-up outpatient with a primary care provider, return for any severe worsening of symptoms, blood glucose persistently elevated greater than 250, continue fluid resuscitation in the setting of your diarrheal illness.

## 2022-12-11 NOTE — ED Notes (Signed)
VBG completed  

## 2022-12-11 NOTE — ED Triage Notes (Signed)
Patient here POV from Home.  Endorses Hyperglycemia. Was being seen at GI for Chron's Disease. Blood Samples collected and BG was above 600 and noted to be hyponatremic as well. Has not taken Diabetes Medication in Months.   No N/V. Some Diarrhea. No Known Fevers. Polyuria and Polydipsia.   NAD Noted during Triage. A&OX4. GCS 15. Ambulatory.

## 2023-01-23 ENCOUNTER — Encounter: Payer: Self-pay | Admitting: Nurse Practitioner

## 2023-01-23 ENCOUNTER — Ambulatory Visit: Payer: No Typology Code available for payment source | Admitting: Nurse Practitioner

## 2023-01-23 VITALS — BP 117/66 | HR 99 | Temp 97.0°F | Ht 63.0 in | Wt 191.2 lb

## 2023-01-23 DIAGNOSIS — E1165 Type 2 diabetes mellitus with hyperglycemia: Secondary | ICD-10-CM | POA: Diagnosis not present

## 2023-01-23 DIAGNOSIS — K5090 Crohn's disease, unspecified, without complications: Secondary | ICD-10-CM

## 2023-01-23 DIAGNOSIS — D509 Iron deficiency anemia, unspecified: Secondary | ICD-10-CM | POA: Insufficient documentation

## 2023-01-23 DIAGNOSIS — E669 Obesity, unspecified: Secondary | ICD-10-CM | POA: Diagnosis not present

## 2023-01-23 DIAGNOSIS — Z794 Long term (current) use of insulin: Secondary | ICD-10-CM | POA: Diagnosis not present

## 2023-01-23 DIAGNOSIS — Z23 Encounter for immunization: Secondary | ICD-10-CM

## 2023-01-23 LAB — POCT GLYCOSYLATED HEMOGLOBIN (HGB A1C): Hemoglobin A1C: 12.6 % — AB (ref 4.0–5.6)

## 2023-01-23 MED ORDER — METFORMIN HCL 500 MG PO TABS
1000.0000 mg | ORAL_TABLET | Freq: Two times a day (BID) | ORAL | 0 refills | Status: DC
Start: 2023-01-23 — End: 2023-03-19

## 2023-01-23 MED ORDER — DEXCOM G7 SENSOR MISC: 3 refills | Status: AC

## 2023-01-23 MED ORDER — SEMAGLUTIDE(0.25 OR 0.5MG/DOS) 2 MG/3ML ~~LOC~~ SOPN
0.5000 mg | PEN_INJECTOR | SUBCUTANEOUS | 2 refills | Status: DC
Start: 2023-02-27 — End: 2023-09-13

## 2023-01-23 MED ORDER — TRESIBA FLEXTOUCH 100 UNIT/ML ~~LOC~~ SOPN
40.0000 [IU] | PEN_INJECTOR | Freq: Every evening | SUBCUTANEOUS | 3 refills | Status: DC
Start: 2023-01-23 — End: 2023-02-18

## 2023-01-23 MED ORDER — SEMAGLUTIDE(0.25 OR 0.5MG/DOS) 2 MG/3ML ~~LOC~~ SOPN
0.2500 mg | PEN_INJECTOR | SUBCUTANEOUS | 0 refills | Status: AC
Start: 2023-01-23 — End: 2023-02-20

## 2023-01-23 NOTE — Assessment & Plan Note (Signed)
Patient educated on CDC recommendation for the TDAP vaccine. Verbal consent was obtained from the patient, vaccine administered by nurse, no sign of adverse reactions noted at this time. Patient education on arm soreness and use of tylenol or ibuprofen (if safe) for this patient  was discussed. Patient educated on the signs and symptoms of adverse effect and advise to contact the office if they occur.

## 2023-01-23 NOTE — Assessment & Plan Note (Signed)
Followed by GI at [facility] physician Patient encouraged to maintain close follow-up with them She denies any complaints today

## 2023-01-23 NOTE — Assessment & Plan Note (Signed)
Lab Results  Component Value Date   WBC 9.3 12/11/2022   HGB 12.6 12/11/2022   HCT 37.0 12/11/2022   MCV 72.7 (L) 12/11/2022   PLT 418 (H) 12/11/2022   She is followed by OB/GYN

## 2023-01-23 NOTE — Assessment & Plan Note (Signed)
Lab Results  Component Value Date   HGBA1C 10.7 (H) 03/05/2022  A1c 12.6 today Currently on Tresiba 40 units daily, metformin 500 mg twice daily Start Ozempic 0.25 mg, once weekly injection increase to 0.5 mg once weekly injection after 4 weeks.  She was encouraged to report abdominal pain nausea vomiting. Start metformin 1000 mg twice daily, continue current dose of [place] Patient referred for diabetes education. CGM ordered CBG goals including signs of hypoglycemia and hypoglycemia discussed She was encouraged to call the office with blood sugar readings consistently less than 70 Diabetic foot exam completed Patient counseled on low-carb modified diet Checking urine microalbumin labs

## 2023-01-23 NOTE — Patient Instructions (Addendum)
Please come fasting to your next appointment  Goal for fasting blood sugar ranges from 80 to 120 and 2 hours after any meal or at bedtime should be between 130 to 170.     Uncontrolled type 2 diabetes mellitus with hyperglycemia, with long-term current use of insulin (HCC)  - Microalbumin / creatinine urine ratio - POCT glycosylated hemoglobin (Hb A1C) - Semaglutide,0.25 or 0.5MG /DOS, 2 MG/3ML SOPN; Inject 0.25 mg into the skin once a week for 28 days.  Dispense: 3 mL; Refill: 0 - Semaglutide,0.25 or 0.5MG /DOS, 2 MG/3ML SOPN; Inject 0.5 mg into the skin once a week.  Dispense: 3 mL; Refill: 2 - metFORMIN (GLUCOPHAGE) 500 MG tablet; Take 2 tablets (1,000 mg total) by mouth 2 (two) times daily with a meal.  Dispense: 240 tablet; Refill: 0 - insulin degludec (TRESIBA FLEXTOUCH) 100 UNIT/ML FlexTouch Pen; Inject 40 Units into the skin at bedtime.  Dispense: 24 mL; Refill: 3 - Ambulatory referral to diabetic education    It is important that you exercise regularly at least 30 minutes 5 times a week as tolerated  Think about what you will eat, plan ahead. Choose " clean, green, fresh or frozen" over canned, processed or packaged foods which are more sugary, salty and fatty. 70 to 75% of food eaten should be vegetables and fruit. Three meals at set times with snacks allowed between meals, but they must be fruit or vegetables. Aim to eat over a 12 hour period , example 7 am to 7 pm, and STOP after  your last meal of the day. Drink water,generally about 64 ounces per day, no other drink is as healthy. Fruit juice is best enjoyed in a healthy way, by EATING the fruit.  Thanks for choosing [facility] we consider it a privelige to serve you.

## 2023-01-23 NOTE — Assessment & Plan Note (Signed)
Wt Readings from Last 3 Encounters:  01/23/23 191 lb 3.2 oz (86.7 kg)  12/11/22 203 lb 7.8 oz (92.3 kg)  03/07/22 203 lb 7.8 oz (92.3 kg)   Body mass index is 33.87 kg/m.  Patient counseled on low-carb modified diet, encouraged to engage in regular moderate to vigorous exercises at least 150 minutes weekly Starting Ozempic

## 2023-01-23 NOTE — Progress Notes (Signed)
New Patient Office Visit  Subjective:  Patient ID: Connie Williamson, female    DOB: [birth year]  Age: 27 y.o. MRN: [id number]  CC:  Chief Complaint  Patient presents with   Follow-up    Hospital follow up.   Establish Care    HPI Connie Williamson is a 27 y.o. female  has a past medical history of Asthma, Crohn's disease (HCC), Diabetes mellitus, Intramural leiomyoma of uterus, and Menorrhagia.  Patient presented outpatient care for her chronic medical conditions.  She had lost follow-up to her previous PCP due to change in her insurance   Uncontrolled type 2 diabetes.  Patient had presented to the emergency department on 12/11/2022 for complaints of polyuria and polydipsia with blood sugar readings above 600, she was evaluated for possible DKA.  Evaristo Bury was restarted at 30 units daily.  Patient stated that she has been taking 40 units daily since then.  Takes metformin 500 mg twice daily.  Stated that she was on Ozempic and short acting insulin in the past.  She stopped taking Ozempic due to lack of insurance.  Stated that she did well on Ozempic she has a glucometer at home but does not check her CBGs, she is requesting for his CGM.  Stated that her diet can be better, she does exercise but not consistently.  She currently denies polyphagia, polyuria, polydipsia  She is up-to-date with her diabetic eye exam records requested.  Chronic disease .she is followed by GI at [facility] physician for Crohn's disease.  Has had a bowel resection in the past.  Was gettingInfliximab  infusion  in the past , has an upcoming colonoscopy. she denies nausea, vomiting, abdominal pain.  Menorrhagia /iron deficiency anemia . goes to the [facility] OB[facility], had a normal Pap smear on 10/11/2022.  Has known fibroids, she is on iron infusions and taking birth control.   Due for Tdap vaccine Tdap vaccine administered in the office today.  Stated that she is up-to-date with the HPV vaccine.  She was encouraged  to bring her vaccine records at her next appointment     Past Medical History:  Diagnosis Date   Asthma    Crohn's disease (HCC)    Diabetes mellitus    Intramural leiomyoma of uterus    Menorrhagia     Past Surgical History:  Procedure Laterality Date   ADENOIDECTOMY     BOWEL RESECTION     TONSILLECTOMY      Family History  Problem Relation Age of Onset   Diabetes Mother    Kidney failure Mother    Hypertension Mother    Healthy Father    Healthy Sister    Healthy Brother    Diabetes Maternal Grandmother    Hypertension Maternal Grandmother    Diabetes Maternal Grandfather    Hypertension Maternal Grandfather     Social History   Socioeconomic History   Marital status: Significant Other    Spouse name: Not on file   Number of children: Not on file   Years of education: Not on file   Highest education level: Not on file  Occupational History   Not on file  Tobacco Use   Smoking status: Never   Smokeless tobacco: Never  Substance and Sexual Activity   Alcohol use: Not Currently    Comment: Socially   Drug use: No   Sexual activity: Yes  Other Topics Concern   Not on file  Social History Narrative   Lives with a room  mate.   Social Determinants of Health   Financial Resource Strain: Not on file  Food Insecurity: Not on file  Transportation Needs: Not on file  Physical Activity: Not on file  Stress: Not on file  Social Connections: Unknown (11/17/2021)   Received from [facility]   Social Network    Social Network: Not on file  Intimate Partner Violence: Unknown (10/11/2021)   Received from [facility]   HITS    Physically Hurt: Not on file    Insult or Talk Down To: Not on file    Threaten Physical Harm: Not on file    Scream or Curse: Not on file    ROS Review of Systems  Constitutional:  Negative for activity change, appetite change, chills, fatigue and fever.  HENT:  Negative for congestion, dental  problem, ear discharge, ear pain, hearing loss, rhinorrhea, sinus pressure, sinus pain, sneezing and sore throat.   Eyes:  Negative for pain, discharge, redness and itching.  Respiratory:  Negative for cough, chest tightness, shortness of breath and wheezing.   Cardiovascular:  Negative for chest pain, palpitations and leg swelling.  Gastrointestinal:  Negative for abdominal distention, abdominal pain, anal bleeding, blood in stool, constipation, diarrhea, nausea, rectal pain and vomiting.  Endocrine: Negative for cold intolerance, heat intolerance, polydipsia, polyphagia and polyuria.  Genitourinary:  Negative for difficulty urinating, dysuria, flank pain, frequency, hematuria, menstrual problem, pelvic pain and vaginal bleeding.  Musculoskeletal:  Negative for arthralgias, back pain, gait problem, joint swelling and myalgias.  Skin:  Negative for color change, pallor, rash and wound.  Allergic/Immunologic: Negative for environmental allergies, food allergies and immunocompromised state.  Neurological:  Negative for dizziness, tremors, facial asymmetry, weakness and headaches.  Hematological:  Negative for adenopathy. Does not bruise/bleed easily.  Psychiatric/Behavioral:  Negative for agitation, behavioral problems, confusion, decreased concentration, hallucinations, self-injury and suicidal ideas.     Objective:   Today's Vitals: BP 117/66   Pulse 99   Temp (!) 97 F (36.1 C)   Ht 5\' 3"  (1.6 m)   Wt 191 lb 3.2 oz (86.7 kg)   SpO2 99%   BMI 33.87 kg/m   Physical Exam Vitals and nursing note reviewed.  Constitutional:      General: She is not in acute distress.    Appearance: Normal appearance. She is obese. She is not ill-appearing, toxic-appearing or diaphoretic.  HENT:     Mouth/Throat:     Mouth: Mucous membranes are moist.     Pharynx: Oropharynx is clear. No oropharyngeal exudate or posterior oropharyngeal erythema.  Eyes:     General: No scleral icterus.       Right eye:  No discharge.        Left eye: No discharge.     Extraocular Movements: Extraocular movements intact.     Conjunctiva/sclera: Conjunctivae normal.  Cardiovascular:     Rate and Rhythm: Normal rate and regular rhythm.     Pulses: Normal pulses.     Heart sounds: Normal heart sounds. No murmur heard.    No friction rub. No gallop.  Pulmonary:     Effort: Pulmonary effort is normal. No respiratory distress.     Breath sounds: Normal breath sounds. No stridor. No wheezing, rhonchi or rales.  Chest:     Chest wall: No tenderness.  Abdominal:     General: There is no distension.     Palpations: Abdomen is soft.     Tenderness: There is no abdominal tenderness. There  is no right CVA tenderness, left CVA tenderness or guarding.  Musculoskeletal:        General: No swelling, tenderness, deformity or signs of injury.     Right lower leg: No edema.     Left lower leg: No edema.  Skin:    General: Skin is warm and dry.     Capillary Refill: Capillary refill takes less than 2 seconds.     Coloration: Skin is not jaundiced or pale.     Findings: No bruising, erythema or lesion.  Neurological:     Mental Status: She is alert and oriented to person, place, and time.     Motor: No weakness.     Coordination: Coordination normal.     Gait: Gait normal.  Psychiatric:        Mood and Affect: Mood normal.        Behavior: Behavior normal.        Thought Content: Thought content normal.        Judgment: Judgment normal.     Assessment & Plan:   Problem List Items Addressed This Visit       Endocrine   Uncontrolled type 2 diabetes mellitus with hyperglycemia, with long-term current use of insulin (HCC)    Lab Results  Component Value Date   HGBA1C 10.7 (H) 03/05/2022  A1c 12.6 today Currently on Tresiba 40 units daily, metformin 500 mg twice daily Start Ozempic 0.25 mg, once weekly injection increase to 0.5 mg once weekly injection after 4 weeks.  She was encouraged to report abdominal  pain nausea vomiting. Start metformin 1000 mg twice daily, continue current dose of [place] Patient referred for diabetes education. CGM ordered CBG goals including signs of hypoglycemia and hypoglycemia discussed She was encouraged to call the office with blood sugar readings consistently less than 70 Diabetic foot exam completed Patient counseled on low-carb modified diet Checking urine microalbumin labs       Relevant Medications   Semaglutide,0.25 or 0.5MG /DOS, 2 MG/3ML SOPN   Semaglutide,0.25 or 0.5MG /DOS, 2 MG/3ML SOPN (Start on 02/27/2023)   metFORMIN (GLUCOPHAGE) 500 MG tablet   insulin degludec (TRESIBA FLEXTOUCH) 100 UNIT/ML FlexTouch Pen   Other Relevant Orders   Microalbumin / creatinine urine ratio   POCT glycosylated hemoglobin (Hb A1C) (Completed)   Ambulatory referral to diabetic education     Other   Obesity (BMI 30-39.9)    Wt Readings from Last 3 Encounters:  01/23/23 191 lb 3.2 oz (86.7 kg)  12/11/22 203 lb 7.8 oz (92.3 kg)  03/07/22 203 lb 7.8 oz (92.3 kg)   Body mass index is 33.87 kg/m.  Patient counseled on low-carb modified diet, encouraged to engage in regular moderate to vigorous exercises at least 150 minutes weekly Starting Ozempic      Relevant Medications   Semaglutide,0.25 or 0.5MG /DOS, 2 MG/3ML SOPN   Semaglutide,0.25 or 0.5MG /DOS, 2 MG/3ML SOPN (Start on 02/27/2023)   metFORMIN (GLUCOPHAGE) 500 MG tablet   insulin degludec (TRESIBA FLEXTOUCH) 100 UNIT/ML FlexTouch Pen   Crohn's disease (HCC) - Primary    Followed by GI at [facility] physician Patient encouraged to maintain close follow-up with them She denies any complaints today      Need for diphtheria-tetanus-pertussis (Tdap) vaccine    Patient educated on CDC recommendation for the TDAP vaccine. Verbal consent was obtained from the patient, vaccine administered by nurse, no sign of adverse reactions noted at this time. Patient education on arm soreness and use of tylenol or ibuprofen (if  safe) for this patient  was discussed. Patient educated on the signs and symptoms of adverse effect and advise to contact the office if they occur.       Iron deficiency anemia    Lab Results  Component Value Date   WBC 9.3 12/11/2022   HGB 12.6 12/11/2022   HCT 37.0 12/11/2022   MCV 72.7 (L) 12/11/2022   PLT 418 (H) 12/11/2022   She is followed by OB/GYN       Outpatient Encounter Medications as of 01/23/2023  Medication Sig   cholecalciferol (VITAMIN D3) 25 MCG (1000 UNIT) tablet Take 1,000 Units by mouth daily.   Continuous Glucose Sensor (DEXCOM G7 SENSOR) MISC Please use as directed.   ibuprofen (ADVIL,MOTRIN) 200 MG tablet Take 400 mg by mouth 2 (two) times daily as needed (pain).   Insulin Pen Needle (PEN NEEDLES) 32G X 4 MM MISC Use as directed with insulin pens   MYFEMBREE 40-1-0.5 MG TABS Take 1 tablet by mouth daily.   ondansetron (ZOFRAN ODT) 4 MG disintegrating tablet Take 1 tablet (4 mg total) by mouth every 8 (eight) hours as needed for nausea.   Semaglutide,0.25 or 0.5MG /DOS, 2 MG/3ML SOPN Inject 0.25 mg into the skin once a week for 28 days.   [START ON 02/27/2023] Semaglutide,0.25 or 0.5MG /DOS, 2 MG/3ML SOPN Inject 0.5 mg into the skin once a week.   SYEDA 3-0.03 MG tablet Take 1 tablet by mouth daily.   [DISCONTINUED] insulin degludec (TRESIBA FLEXTOUCH) 100 UNIT/ML FlexTouch Pen Inject 30 Units into the skin at bedtime.   [DISCONTINUED] metFORMIN (GLUCOPHAGE) 500 MG tablet Take 1 tablet (500 mg total) by mouth 2 (two) times daily with a meal.   Cholecalciferol (VITAMIN D3) 1.25 MG (50000 UT) CAPS Take 1 capsule by mouth on Tuesday and Friday (Patient not taking: Reported on 03/05/2022)   insulin degludec (TRESIBA FLEXTOUCH) 100 UNIT/ML FlexTouch Pen Inject 40 Units into the skin at bedtime.   metFORMIN (GLUCOPHAGE) 500 MG tablet Take 2 tablets (1,000 mg total) by mouth 2 (two) times daily with a meal.   No facility-administered encounter medications on file as of  01/23/2023.    Follow-up: Return in about 3 months (around 04/25/2023) for DM.   Donell Beers, FNP

## 2023-01-24 LAB — MICROALBUMIN / CREATININE URINE RATIO
Creatinine, Urine: 216.4 mg/dL
Microalb/Creat Ratio: 341 mg/g creat — ABNORMAL HIGH (ref 0–29)
Microalbumin, Urine: 738.4 ug/mL

## 2023-01-25 ENCOUNTER — Other Ambulatory Visit: Payer: Self-pay | Admitting: Nurse Practitioner

## 2023-01-25 DIAGNOSIS — E1129 Type 2 diabetes mellitus with other diabetic kidney complication: Secondary | ICD-10-CM

## 2023-02-18 ENCOUNTER — Other Ambulatory Visit: Payer: Self-pay

## 2023-02-18 DIAGNOSIS — E1165 Type 2 diabetes mellitus with hyperglycemia: Secondary | ICD-10-CM

## 2023-02-18 MED ORDER — TRESIBA FLEXTOUCH 100 UNIT/ML ~~LOC~~ SOPN
40.0000 [IU] | PEN_INJECTOR | Freq: Every evening | SUBCUTANEOUS | 1 refills | Status: DC
Start: 2023-02-18 — End: 2023-05-29

## 2023-02-20 ENCOUNTER — Other Ambulatory Visit: Payer: Self-pay | Admitting: Nurse Practitioner

## 2023-02-20 DIAGNOSIS — E1165 Type 2 diabetes mellitus with hyperglycemia: Secondary | ICD-10-CM

## 2023-03-19 ENCOUNTER — Other Ambulatory Visit: Payer: Self-pay | Admitting: Nurse Practitioner

## 2023-03-19 DIAGNOSIS — E1165 Type 2 diabetes mellitus with hyperglycemia: Secondary | ICD-10-CM

## 2023-03-19 MED ORDER — METFORMIN HCL 500 MG PO TABS: 1000.0000 mg | ORAL_TABLET | Freq: Two times a day (BID) | ORAL | 3 refills | Status: DC

## 2023-04-24 ENCOUNTER — Ambulatory Visit: Payer: Self-pay | Admitting: Nurse Practitioner

## 2023-05-13 ENCOUNTER — Ambulatory Visit: Payer: No Typology Code available for payment source | Admitting: Dietician

## 2023-05-29 ENCOUNTER — Other Ambulatory Visit: Payer: Self-pay | Admitting: Nurse Practitioner

## 2023-05-29 DIAGNOSIS — E1165 Type 2 diabetes mellitus with hyperglycemia: Secondary | ICD-10-CM

## 2023-05-29 NOTE — Telephone Encounter (Signed)
Please advise of PA

## 2023-05-30 ENCOUNTER — Other Ambulatory Visit: Payer: Self-pay

## 2023-05-31 ENCOUNTER — Other Ambulatory Visit: Payer: Self-pay

## 2023-09-13 ENCOUNTER — Other Ambulatory Visit: Payer: Self-pay | Admitting: Nurse Practitioner

## 2023-09-13 ENCOUNTER — Other Ambulatory Visit: Payer: Self-pay

## 2023-09-13 DIAGNOSIS — E1165 Type 2 diabetes mellitus with hyperglycemia: Secondary | ICD-10-CM

## 2023-09-13 MED ORDER — SEMAGLUTIDE(0.25 OR 0.5MG/DOS) 2 MG/3ML ~~LOC~~ SOPN: 0.5000 mg | PEN_INJECTOR | SUBCUTANEOUS | 0 refills | Status: DC

## 2023-09-13 NOTE — Telephone Encounter (Signed)
 Please advise [facility]

## 2023-10-07 ENCOUNTER — Ambulatory Visit: Admitting: Nurse Practitioner

## 2023-10-08 ENCOUNTER — Other Ambulatory Visit: Payer: Self-pay | Admitting: Nurse Practitioner

## 2023-10-08 ENCOUNTER — Ambulatory Visit: Admitting: Nurse Practitioner

## 2023-10-08 DIAGNOSIS — E1165 Type 2 diabetes mellitus with hyperglycemia: Secondary | ICD-10-CM

## 2023-10-08 MED ORDER — SEMAGLUTIDE(0.25 OR 0.5MG/DOS) 2 MG/3ML ~~LOC~~ SOPN
0.5000 mg | PEN_INJECTOR | SUBCUTANEOUS | 0 refills | Status: AC
Start: 2023-10-08 — End: ?

## 2023-10-08 NOTE — Telephone Encounter (Signed)
 Please advise [facility]

## 2023-10-11 ENCOUNTER — Other Ambulatory Visit: Payer: Self-pay

## 2023-10-11 DIAGNOSIS — E1165 Type 2 diabetes mellitus with hyperglycemia: Secondary | ICD-10-CM

## 2023-10-11 MED ORDER — TRESIBA FLEXTOUCH 100 UNIT/ML ~~LOC~~ SOPN: 40.0000 [IU] | PEN_INJECTOR | Freq: Every evening | SUBCUTANEOUS | 3 refills | Status: DC

## 2023-10-30 ENCOUNTER — Ambulatory Visit: Admitting: Nurse Practitioner

## 2023-11-15 ENCOUNTER — Encounter (HOSPITAL_BASED_OUTPATIENT_CLINIC_OR_DEPARTMENT_OTHER): Payer: Self-pay | Admitting: Emergency Medicine

## 2023-11-15 ENCOUNTER — Inpatient Hospital Stay (HOSPITAL_BASED_OUTPATIENT_CLINIC_OR_DEPARTMENT_OTHER)
Admission: EM | Admit: 2023-11-15 | Discharge: 2023-11-17 | DRG: 387 | Disposition: A | Discharge status: Home / Self Care | Attending: Family Medicine | Admitting: Family Medicine

## 2023-11-15 ENCOUNTER — Emergency Department (HOSPITAL_BASED_OUTPATIENT_CLINIC_OR_DEPARTMENT_OTHER)

## 2023-11-15 ENCOUNTER — Other Ambulatory Visit: Payer: Self-pay

## 2023-11-15 DIAGNOSIS — Z794 Long term (current) use of insulin: Secondary | ICD-10-CM

## 2023-11-15 DIAGNOSIS — Z8249 Family history of ischemic heart disease and other diseases of the circulatory system: Secondary | ICD-10-CM

## 2023-11-15 DIAGNOSIS — E669 Obesity, unspecified: Secondary | ICD-10-CM | POA: Diagnosis present

## 2023-11-15 DIAGNOSIS — Z5971 Insufficient health insurance coverage: Secondary | ICD-10-CM

## 2023-11-15 DIAGNOSIS — D75838 Other thrombocytosis: Secondary | ICD-10-CM | POA: Diagnosis present

## 2023-11-15 DIAGNOSIS — I10 Essential (primary) hypertension: Secondary | ICD-10-CM | POA: Diagnosis present

## 2023-11-15 DIAGNOSIS — K5900 Constipation, unspecified: Secondary | ICD-10-CM | POA: Diagnosis present

## 2023-11-15 DIAGNOSIS — Z9049 Acquired absence of other specified parts of digestive tract: Secondary | ICD-10-CM

## 2023-11-15 DIAGNOSIS — K5090 Crohn's disease, unspecified, without complications: Secondary | ICD-10-CM | POA: Diagnosis present

## 2023-11-15 DIAGNOSIS — Z6836 Body mass index (BMI) 36.0-36.9, adult: Secondary | ICD-10-CM

## 2023-11-15 DIAGNOSIS — K529 Noninfective gastroenteritis and colitis, unspecified: Principal | ICD-10-CM | POA: Diagnosis present

## 2023-11-15 DIAGNOSIS — K5000 Crohn's disease of small intestine without complications: Secondary | ICD-10-CM | POA: Diagnosis not present

## 2023-11-15 DIAGNOSIS — Z7985 Long-term (current) use of injectable non-insulin antidiabetic drugs: Secondary | ICD-10-CM

## 2023-11-15 DIAGNOSIS — R109 Unspecified abdominal pain: Secondary | ICD-10-CM | POA: Diagnosis not present

## 2023-11-15 DIAGNOSIS — N83202 Unspecified ovarian cyst, left side: Secondary | ICD-10-CM | POA: Diagnosis present

## 2023-11-15 DIAGNOSIS — J45909 Unspecified asthma, uncomplicated: Secondary | ICD-10-CM | POA: Diagnosis present

## 2023-11-15 DIAGNOSIS — E1165 Type 2 diabetes mellitus with hyperglycemia: Secondary | ICD-10-CM

## 2023-11-15 DIAGNOSIS — Z7984 Long term (current) use of oral hypoglycemic drugs: Secondary | ICD-10-CM

## 2023-11-15 DIAGNOSIS — D509 Iron deficiency anemia, unspecified: Secondary | ICD-10-CM | POA: Diagnosis present

## 2023-11-15 DIAGNOSIS — Z833 Family history of diabetes mellitus: Secondary | ICD-10-CM

## 2023-11-15 DIAGNOSIS — E66812 Obesity, class 2: Secondary | ICD-10-CM | POA: Diagnosis present

## 2023-11-15 DIAGNOSIS — Z841 Family history of disorders of kidney and ureter: Secondary | ICD-10-CM

## 2023-11-15 LAB — COMPREHENSIVE METABOLIC PANEL WITH GFR
ALT: <5 U/L (ref 0–44)
AST: 9 U/L — ABNORMAL LOW (ref 15–41)
Albumin: 3.8 g/dL (ref 3.5–5.0)
Alkaline Phosphatase: 114 U/L (ref 38–126)
Anion gap: 12 (ref 5–15)
BUN: 6 mg/dL (ref 6–20)
CO2: 24 mmol/L (ref 22–32)
Calcium: 9.7 mg/dL (ref 8.9–10.3)
Chloride: 99 mmol/L (ref 98–111)
Creatinine, Ser: 0.78 mg/dL (ref 0.44–1.00)
GFR, Estimated: >60 mL/min (ref >60)
Glucose, Bld: 186 mg/dL — ABNORMAL HIGH (ref 70–99)
Potassium: 4 mmol/L (ref 3.5–5.1)
Sodium: 135 mmol/L (ref 135–145)
Total Bilirubin: 0.2 mg/dL (ref 0.0–1.2)
Total Protein: 7 g/dL (ref 6.5–8.1)

## 2023-11-15 LAB — URINALYSIS, ROUTINE W REFLEX MICROSCOPIC
Bilirubin Urine: NEGATIVE
Glucose, UA: NEGATIVE mg/dL
Hgb urine dipstick: NEGATIVE
Leukocytes,Ua: NEGATIVE
Nitrite: NEGATIVE
Specific Gravity, Urine: 1.008 (ref 1.005–1.030)
pH: 7.5 (ref 5.0–8.0)

## 2023-11-15 LAB — CBC
HCT: 35.9 % — ABNORMAL LOW (ref 36.0–46.0)
Hemoglobin: 11.3 g/dL — ABNORMAL LOW (ref 12.0–15.0)
MCH: 24.1 pg — ABNORMAL LOW (ref 26.0–34.0)
MCHC: 31.5 g/dL (ref 30.0–36.0)
MCV: 76.5 fL — ABNORMAL LOW (ref 80.0–100.0)
Platelets: 471 10*3/uL — ABNORMAL HIGH (ref 150–400)
RBC: 4.69 MIL/uL (ref 3.87–5.11)
RDW: 15.3 % (ref 11.5–15.5)
WBC: 16.5 10*3/uL — ABNORMAL HIGH (ref 4.0–10.5)
nRBC: 0 % (ref 0.0–0.2)

## 2023-11-15 LAB — LIPASE, BLOOD: Lipase: 19 U/L (ref 11–51)

## 2023-11-15 LAB — PREGNANCY, URINE: Preg Test, Ur: NEGATIVE

## 2023-11-15 MED ORDER — FENTANYL CITRATE PF 50 MCG/ML IJ SOSY
50.0000 ug | PREFILLED_SYRINGE | Freq: Once | INTRAMUSCULAR | Status: AC
  Administered 2023-11-15: 50 ug via INTRAVENOUS
  Filled 2023-11-15: qty 1

## 2023-11-15 MED ORDER — IOHEXOL 300 MG/ML  SOLN
100.0000 mL | Freq: Once | INTRAMUSCULAR | Status: AC | PRN
  Administered 2023-11-15: 100 mL via INTRAVENOUS

## 2023-11-15 MED ORDER — LACTATED RINGERS IV BOLUS
1000.0000 mL | Freq: Once | INTRAVENOUS | Status: AC
  Administered 2023-11-15: 1000 mL via INTRAVENOUS

## 2023-11-15 MED ORDER — ONDANSETRON HCL 4 MG/2ML IJ SOLN
4.0000 mg | Freq: Once | INTRAMUSCULAR | Status: AC
Start: 2023-11-15 — End: 2023-11-15
  Administered 2023-11-15: 4 mg via INTRAVENOUS
  Filled 2023-11-15: qty 2

## 2023-11-15 NOTE — ED Triage Notes (Signed)
 Abdo pain x 3 day Constipation  Extreme pain today " I think its a crohns flare up"

## 2023-11-15 NOTE — ED Provider Notes (Signed)
 [place] EMERGENCY DEPARTMENT AT [facility] Provider Note   CSN: [id number] Arrival date & time: 11/15/23  1538     History {Add pertinent medical, surgical, social history, OB history to HPI:1} Chief Complaint  Patient presents with   Abdominal Pain    Connie Williamson is a 28 y.o. female with Crohn's disease with previous bowel resection, hypertension, type 2 diabetes, presents with concern for generalized abdominal pain ongoing for the past 3 days.  Also reports she has not had a bowel movement in 3 days.  She is still passing gas.  Reports nausea but no vomiting.  Also having decreased appetite due to the pain.  Denies any fever, chills, dysuria, hematuria, abnormal vaginal discharge.  She feels like her pain is a Crohn's flare.  States she is not on any maintenance medications currently for her Crohn's.   Abdominal Pain      Home Medications Prior to Admission medications   Medication Sig Start Date End Date Taking? Authorizing Provider  cholecalciferol (VITAMIN D3) 25 MCG (1000 UNIT) tablet Take 1,000 Units by mouth daily.    [provider]  Continuous Glucose Sensor (DEXCOM G7 SENSOR) MISC Please use as directed. 01/23/23   Paseda, Folashade R, FNP  ibuprofen  (ADVIL ,MOTRIN ) 200 MG tablet Take 400 mg by mouth 2 (two) times daily as needed (pain).    [provider]  insulin  degludec (TRESIBA  FLEXTOUCH) 100 UNIT/ML FlexTouch Pen Inject 40 Units into the skin at bedtime. 10/11/23   Paseda, Folashade R, FNP  Insulin  Pen Needle (PEN NEEDLES) 32G X 4 MM MISC Use as directed with insulin  pens 12/11/22   Rosealee Concha, MD  metFORMIN  (GLUCOPHAGE ) 500 MG tablet Take 2 tablets (1,000 mg total) by mouth 2 (two) times daily with a meal. 03/19/23   Paseda, Folashade R, FNP  MYFEMBREE 40-1-0.5 MG TABS Take 1 tablet by mouth daily. 10/29/22   [provider]  ondansetron  (ZOFRAN  ODT) 4 MG disintegrating tablet Take 1 tablet (4 mg total) by mouth every 8  (eight) hours as needed for nausea. 01/25/15   Piepenbrink, Bridgette Campus, PA-C  Semaglutide ,0.25 or 0.5MG /DOS, 2 MG/3ML SOPN Inject 0.5 mg into the skin once a week. 10/08/23   Paseda, Folashade R, FNP  SYEDA 3-0.03 MG tablet Take 1 tablet by mouth daily. 10/29/22   [provider]      Allergies    Patient has no known allergies.    Review of Systems   Review of Systems  Gastrointestinal:  Positive for abdominal pain.    Physical Exam Updated Vital Signs BP 106/69   Pulse (!) 134   Temp 99.5 F (37.5 C) (Oral)   Resp 16   LMP 11/04/2023 (Approximate)   SpO2 98%  Physical Exam Vitals and nursing note reviewed.  Constitutional:      General: She is not in acute distress.    Appearance: She is well-developed.     Comments: No active vomiting  HENT:     Head: Normocephalic and atraumatic.  Eyes:     Conjunctiva/sclera: Conjunctivae normal.  Cardiovascular:     Rate and Rhythm: Regular rhythm. Tachycardia present.     Heart sounds: No murmur heard. Pulmonary:     Effort: Pulmonary effort is normal. No respiratory distress.     Breath sounds: Normal breath sounds.  Abdominal:     Palpations: Abdomen is soft.     Tenderness: There is abdominal tenderness.     Comments: Abdomen diffusely tender to palpation.  No rebound  or guarding  Musculoskeletal:        General: No swelling.     Cervical back: Neck supple.  Skin:    General: Skin is warm and dry.     Capillary Refill: Capillary refill takes less than 2 seconds.  Neurological:     Mental Status: She is alert.  Psychiatric:        Mood and Affect: Mood normal.     ED Results / Procedures / Treatments   Labs (all labs ordered are listed, but only abnormal results are displayed) Labs Reviewed  LIPASE, BLOOD  COMPREHENSIVE METABOLIC PANEL WITH GFR  CBC  URINALYSIS, ROUTINE W REFLEX MICROSCOPIC  PREGNANCY, URINE    EKG None  Radiology No results found.  Procedures Procedures  {Document cardiac monitor,  telemetry assessment procedure when appropriate:1}  Medications Ordered in ED Medications - No data to display  ED Course/ Medical Decision Making/ A&P   {   Click here for ABCD2, HEART and other calculatorsREFRESH Note before signing :1}                              Medical Decision Making Amount and/or Complexity of Data Reviewed Labs: ordered.  Risk Prescription drug management.     Differential diagnosis includes but is not limited to Cholelithiasis, cholangitis, choledocholithiasis, peptic ulcer, gastritis, gastroenteritis, appendicitis, IBS, IBD, DKA, nephrolithiasis, UTI, pyelonephritis, pancreatitis, diverticulitis, mesenteric ischemia, abdominal aortic aneurysm, small bowel obstruction, volvulus, ovarian torsion and pregnancy related concerns in females of childbearing age    ED Course:  Upon initial evaluation, patient is well-appearing, stable vitals aside from tachycardia to 130s.  Abdomen diffusely tender to palpation, but no rebound or guarding.  Labs Ordered: I Ordered, and personally interpreted labs.  The pertinent results include:   CBC with leukocytosis of 16.5, hemoglobin low at 11.3  Imaging Studies ordered: I ordered imaging studies including ***  I independently visualized the imaging with scope of interpretation limited to determining acute life threatening conditions related to emergency care. Imaging showed *** I agree with the radiologist interpretation   Cardiac Monitoring: / EKG: The patient was maintained on a cardiac monitor.  I personally viewed and interpreted the cardiac monitored which showed an underlying rhythm of: ***   Consultations Obtained: I requested consultation with the ***,  and discussed lab and imaging findings as well as pertinent plan - they recommend: ***  Medications Given: Fentanyl  given for pain LR bolus for dehydration Zofran  for nausea  Upon re-evaluation, patient ***.      Impression: ***  Disposition:   {AF ED Dispo:29713} Return precautions given.    Record Review: External records from outside source obtained and reviewed including ***     This chart was dictated using voice recognition software, Dragon. Despite the best efforts of this provider to proofread and correct errors, errors may still occur which can change documentation meaning.    {Document critical care time when appropriate:1} {Document review of labs and clinical decision tools ie heart score, Chads2Vasc2 etc:1}  {Document your independent review of radiology images, and any outside records:1} {Document your discussion with family members, caretakers, and with consultants:1} {Document social determinants of health affecting pt's care:1} {Document your decision making why or why not admission, treatments were needed:1} Final Clinical Impression(s) / ED Diagnoses Final diagnoses:  None    Rx / DC Orders ED Discharge Orders     None

## 2023-11-16 DIAGNOSIS — Z7984 Long term (current) use of oral hypoglycemic drugs: Secondary | ICD-10-CM | POA: Diagnosis not present

## 2023-11-16 DIAGNOSIS — Z833 Family history of diabetes mellitus: Secondary | ICD-10-CM | POA: Diagnosis not present

## 2023-11-16 DIAGNOSIS — K5900 Constipation, unspecified: Secondary | ICD-10-CM | POA: Diagnosis present

## 2023-11-16 DIAGNOSIS — N83202 Unspecified ovarian cyst, left side: Secondary | ICD-10-CM | POA: Diagnosis present

## 2023-11-16 DIAGNOSIS — Z6836 Body mass index (BMI) 36.0-36.9, adult: Secondary | ICD-10-CM | POA: Diagnosis not present

## 2023-11-16 DIAGNOSIS — Z841 Family history of disorders of kidney and ureter: Secondary | ICD-10-CM | POA: Diagnosis not present

## 2023-11-16 DIAGNOSIS — J45909 Unspecified asthma, uncomplicated: Secondary | ICD-10-CM | POA: Diagnosis present

## 2023-11-16 DIAGNOSIS — D75838 Other thrombocytosis: Secondary | ICD-10-CM | POA: Diagnosis present

## 2023-11-16 DIAGNOSIS — E66812 Obesity, class 2: Secondary | ICD-10-CM | POA: Diagnosis present

## 2023-11-16 DIAGNOSIS — Z5971 Insufficient health insurance coverage: Secondary | ICD-10-CM | POA: Diagnosis not present

## 2023-11-16 DIAGNOSIS — R109 Unspecified abdominal pain: Secondary | ICD-10-CM | POA: Diagnosis present

## 2023-11-16 DIAGNOSIS — K529 Noninfective gastroenteritis and colitis, unspecified: Principal | ICD-10-CM

## 2023-11-16 DIAGNOSIS — K5000 Crohn's disease of small intestine without complications: Secondary | ICD-10-CM | POA: Diagnosis present

## 2023-11-16 DIAGNOSIS — Z8249 Family history of ischemic heart disease and other diseases of the circulatory system: Secondary | ICD-10-CM | POA: Diagnosis not present

## 2023-11-16 DIAGNOSIS — Z9049 Acquired absence of other specified parts of digestive tract: Secondary | ICD-10-CM | POA: Diagnosis not present

## 2023-11-16 DIAGNOSIS — Z7985 Long-term (current) use of injectable non-insulin antidiabetic drugs: Secondary | ICD-10-CM | POA: Diagnosis not present

## 2023-11-16 DIAGNOSIS — E1165 Type 2 diabetes mellitus with hyperglycemia: Secondary | ICD-10-CM | POA: Diagnosis present

## 2023-11-16 DIAGNOSIS — D509 Iron deficiency anemia, unspecified: Secondary | ICD-10-CM | POA: Diagnosis present

## 2023-11-16 DIAGNOSIS — I10 Essential (primary) hypertension: Secondary | ICD-10-CM | POA: Diagnosis present

## 2023-11-16 DIAGNOSIS — Z794 Long term (current) use of insulin: Secondary | ICD-10-CM | POA: Diagnosis not present

## 2023-11-16 LAB — COMPREHENSIVE METABOLIC PANEL WITH GFR
ALT: 9 U/L (ref 0–44)
AST: 8 U/L — ABNORMAL LOW (ref 15–41)
Albumin: 3.1 g/dL — ABNORMAL LOW (ref 3.5–5.0)
Alkaline Phosphatase: 95 U/L (ref 38–126)
Anion gap: 11 (ref 5–15)
BUN: 11 mg/dL (ref 6–20)
CO2: 22 mmol/L (ref 22–32)
Calcium: 8.9 mg/dL (ref 8.9–10.3)
Chloride: 100 mmol/L (ref 98–111)
Creatinine, Ser: 0.78 mg/dL (ref 0.44–1.00)
GFR, Estimated: >60 mL/min (ref >60)
Glucose, Bld: 321 mg/dL — ABNORMAL HIGH (ref 70–99)
Potassium: 4.7 mmol/L (ref 3.5–5.1)
Sodium: 133 mmol/L — ABNORMAL LOW (ref 135–145)
Total Bilirubin: 0.8 mg/dL (ref 0.0–1.2)
Total Protein: 7.5 g/dL (ref 6.5–8.1)

## 2023-11-16 LAB — CBC
HCT: 35.9 % — ABNORMAL LOW (ref 36.0–46.0)
Hemoglobin: 10.7 g/dL — ABNORMAL LOW (ref 12.0–15.0)
MCH: 24.4 pg — ABNORMAL LOW (ref 26.0–34.0)
MCHC: 29.8 g/dL — ABNORMAL LOW (ref 30.0–36.0)
MCV: 81.8 fL (ref 80.0–100.0)
Platelets: 499 10*3/uL — ABNORMAL HIGH (ref 150–400)
RBC: 4.39 MIL/uL (ref 3.87–5.11)
RDW: 15.4 % (ref 11.5–15.5)
WBC: 12.6 10*3/uL — ABNORMAL HIGH (ref 4.0–10.5)
nRBC: 0.2 % (ref 0.0–0.2)

## 2023-11-16 LAB — GLUCOSE, CAPILLARY
Glucose-Capillary: 330 mg/dL — ABNORMAL HIGH (ref 70–99)
Glucose-Capillary: 304 mg/dL — ABNORMAL HIGH (ref 70–99)
Glucose-Capillary: 284 mg/dL — ABNORMAL HIGH (ref 70–99)

## 2023-11-16 LAB — HIV ANTIBODY (ROUTINE TESTING W REFLEX): HIV Screen 4th Generation wRfx: NONREACTIVE

## 2023-11-16 MED ORDER — ALBUTEROL SULFATE (2.5 MG/3ML) 0.083% IN NEBU: 2.5000 mg | INHALATION_SOLUTION | RESPIRATORY_TRACT | Status: DC | PRN

## 2023-11-16 MED ORDER — INSULIN ASPART 100 UNIT/ML IJ SOLN
0.0000 [IU] | Freq: Every day | INTRAMUSCULAR | Status: DC
  Administered 2023-11-16: 3 [IU] via SUBCUTANEOUS

## 2023-11-16 MED ORDER — ONDANSETRON HCL 4 MG/2ML IJ SOLN
4.0000 mg | Freq: Four times a day (QID) | INTRAMUSCULAR | Status: DC | PRN
Start: 2023-11-16 — End: 2023-11-17
  Administered 2023-11-17: 4 mg via INTRAVENOUS
  Filled 2023-11-16: qty 2

## 2023-11-16 MED ORDER — ACETAMINOPHEN 325 MG PO TABS: 650.0000 mg | ORAL_TABLET | Freq: Four times a day (QID) | ORAL | Status: DC | PRN

## 2023-11-16 MED ORDER — POLYETHYLENE GLYCOL 3350 17 G PO PACK
17.0000 g | PACK | Freq: Every day | ORAL | Status: DC
  Administered 2023-11-16 – 2023-11-17 (×2): 17 g via ORAL
  Filled 2023-11-16 (×2): qty 1

## 2023-11-16 MED ORDER — HYDROMORPHONE HCL 1 MG/ML IJ SOLN: 0.5000 mg | INTRAMUSCULAR | Status: DC | PRN

## 2023-11-16 MED ORDER — LACTATED RINGERS IV BOLUS
1000.0000 mL | Freq: Once | INTRAVENOUS | Status: AC
  Administered 2023-11-16: 1000 mL via INTRAVENOUS

## 2023-11-16 MED ORDER — SODIUM CHLORIDE 0.9 % IV SOLN
2.0000 g | INTRAVENOUS | Status: DC
  Administered 2023-11-17: 2 g via INTRAVENOUS
  Filled 2023-11-16: qty 20

## 2023-11-16 MED ORDER — ONDANSETRON HCL 4 MG/2ML IJ SOLN
4.0000 mg | Freq: Once | INTRAMUSCULAR | Status: AC
  Administered 2023-11-16: 4 mg via INTRAVENOUS
  Filled 2023-11-16: qty 2

## 2023-11-16 MED ORDER — DEXAMETHASONE SODIUM PHOSPHATE 10 MG/ML IJ SOLN
10.0000 mg | Freq: Once | INTRAMUSCULAR | Status: AC
  Administered 2023-11-16: 10 mg via INTRAVENOUS
  Filled 2023-11-16: qty 1

## 2023-11-16 MED ORDER — METRONIDAZOLE 500 MG/100ML IV SOLN
500.0000 mg | Freq: Two times a day (BID) | INTRAVENOUS | Status: DC
  Administered 2023-11-16 – 2023-11-17 (×3): 500 mg via INTRAVENOUS
  Filled 2023-11-16 (×3): qty 100

## 2023-11-16 MED ORDER — ACETAMINOPHEN 650 MG RE SUPP: 650.0000 mg | Freq: Four times a day (QID) | RECTAL | Status: DC | PRN

## 2023-11-16 MED ORDER — OXYCODONE HCL 5 MG PO TABS: 5.0000 mg | ORAL_TABLET | ORAL | Status: DC | PRN

## 2023-11-16 MED ORDER — ONDANSETRON HCL 4 MG PO TABS: 4.0000 mg | ORAL_TABLET | Freq: Four times a day (QID) | ORAL | Status: DC | PRN

## 2023-11-16 MED ORDER — FENTANYL CITRATE PF 50 MCG/ML IJ SOSY
50.0000 ug | PREFILLED_SYRINGE | INTRAMUSCULAR | Status: DC | PRN
  Filled 2023-11-16: qty 1

## 2023-11-16 MED ORDER — METHYLPREDNISOLONE SODIUM SUCC 40 MG IJ SOLR
40.0000 mg | Freq: Two times a day (BID) | INTRAMUSCULAR | Status: DC
  Administered 2023-11-16 – 2023-11-17 (×3): 40 mg via INTRAVENOUS
  Filled 2023-11-16 (×3): qty 1

## 2023-11-16 MED ORDER — CEFTRIAXONE SODIUM 2 G IJ SOLR
2.0000 g | Freq: Once | INTRAMUSCULAR | Status: AC
  Administered 2023-11-16: 2 g via INTRAVENOUS
  Filled 2023-11-16: qty 20

## 2023-11-16 MED ORDER — INSULIN ASPART 100 UNIT/ML IJ SOLN
0.0000 [IU] | Freq: Three times a day (TID) | INTRAMUSCULAR | Status: DC
  Administered 2023-11-16 – 2023-11-17 (×3): 11 [IU] via SUBCUTANEOUS
  Administered 2023-11-17: 5 [IU] via SUBCUTANEOUS

## 2023-11-16 MED ORDER — ENOXAPARIN SODIUM 40 MG/0.4ML IJ SOSY
40.0000 mg | PREFILLED_SYRINGE | INTRAMUSCULAR | Status: DC
  Administered 2023-11-16: 40 mg via SUBCUTANEOUS
  Filled 2023-11-16: qty 0.4

## 2023-11-16 NOTE — ED Notes (Signed)
 Report given to the floor RN.

## 2023-11-16 NOTE — H&P (Signed)
 History and Physical  Connie Williamson:[id number] DOB: 08-03-1995 DOA: 11/15/2023  PCP: Paseda, Folashade R, FNP   Chief Complaint: Abdominal pain, constipation  HPI: Connie Williamson is a 28 y.o. female with medical history significant for insulin -dependent type 2 diabetes, Crohn's disease status post small bowel resection 2018 previously on Remicade currently not on medications being admitted to the hospital with ileitis.  Patient state she has been constipated for the last 3 or 4 days, yesterday morning she woke up after a nap with profuse sweating and decided to come to the ER for evaluation.  Workup in the ER as detailed below shows evidence of leukocytosis, ileitis and so she was admitted to the hospitalist service after receiving empiric IV antibiotics, and IV steroids.  Currently she is feeling more comfortable, but having some mild lower abdominal discomfort.  Review of Systems: Please see HPI for pertinent positives and negatives. A complete 10 system review of systems are otherwise negative.  Past Medical History:  Diagnosis Date   Asthma    Crohn's disease (HCC)    Diabetes mellitus    Intramural leiomyoma of uterus    Menorrhagia    Past Surgical History:  Procedure Laterality Date   ADENOIDECTOMY     BOWEL RESECTION     TONSILLECTOMY     Social History:  reports that she has never smoked. She has never used smokeless tobacco. She reports that she does not currently use alcohol. She reports that she does not use drugs.  No Known Allergies  Family History  Problem Relation Age of Onset   Diabetes Mother    Kidney failure Mother    Hypertension Mother    Healthy Father    Healthy Sister    Healthy Brother    Diabetes Maternal Grandmother    Hypertension Maternal Grandmother    Diabetes Maternal Grandfather    Hypertension Maternal Grandfather      Prior to Admission medications   Medication Sig Start Date End Date Taking? Authorizing Provider   ibuprofen  (ADVIL ,MOTRIN ) 200 MG tablet Take 400 mg by mouth daily as needed for mild pain (pain score 1-3) or moderate pain (pain score 4-6).   Yes [provider]  insulin  degludec (TRESIBA  FLEXTOUCH) 100 UNIT/ML FlexTouch Pen Inject 40 Units into the skin at bedtime. 10/11/23  Yes Paseda, Folashade R, FNP  ondansetron  (ZOFRAN  ODT) 4 MG disintegrating tablet Take 1 tablet (4 mg total) by mouth every 8 (eight) hours as needed for nausea. Patient taking differently: Take 4 mg by mouth as needed for nausea. 01/25/15  Yes Piepenbrink, Jennifer, PA-C  Semaglutide ,0.25 or 0.5MG /DOS, 2 MG/3ML SOPN Inject 0.5 mg into the skin once a week. 10/08/23  Yes Paseda, Folashade R, FNP  Continuous Glucose Sensor (DEXCOM G7 SENSOR) MISC Please use as directed. 01/23/23   Paseda, Folashade R, FNP  SYEDA 3-0.03 MG tablet Take 1 tablet by mouth daily. Patient not taking: Reported on 11/16/2023 10/29/22   [provider]    Physical Exam: BP 114/63 (BP Location: Left Arm)   Pulse 100   Temp 98.4 F (36.9 C) (Oral)   Resp 20   LMP 11/04/2023 (Approximate)   SpO2 100%  General:  Alert, oriented, calm, in no acute distress, looks comfortable and nontoxic Cardiovascular: RRR, no murmurs or rubs, no peripheral edema  Respiratory: clear to auscultation bilaterally, no wheezes, no crackles  Abdomen: soft, mildly tender, minimally distended, normal bowel tones heard  Skin: dry, no rashes  Musculoskeletal: no joint effusions, normal  range of motion  Psychiatric: appropriate affect, normal speech  Neurologic: extraocular muscles intact, clear speech, moving all extremities with intact sensorium         Labs on Admission:  Basic Metabolic Panel: Recent Labs  Lab 11/15/23 2020  NA 135  K 4.0  CL 99  CO2 24  GLUCOSE 186*  BUN 6  CREATININE 0.78  CALCIUM 9.7   Liver Function Tests: Recent Labs  Lab 11/15/23 2020  AST 9*  ALT <5  ALKPHOS 114  BILITOT 0.2  PROT 7.0  ALBUMIN 3.8   Recent  Labs  Lab 11/15/23 1811  LIPASE 19   No results for input(s): "AMMONIA" in the last 168 hours. CBC: Recent Labs  Lab 11/15/23 1648  WBC 16.5*  HGB 11.3*  HCT 35.9*  MCV 76.5*  PLT 471*   Cardiac Enzymes: No results for input(s): "CKTOTAL", "CKMB", "CKMBINDEX", "TROPONINI" in the last 168 hours. BNP (last 3 results) No results for input(s): "BNP" in the last 8760 hours.  ProBNP (last 3 results) No results for input(s): "PROBNP" in the last 8760 hours.  CBG: No results for input(s): "GLUCAP" in the last 168 hours.  Radiological Exams on Admission: CT ABDOMEN PELVIS W CONTRAST Result Date: 11/15/2023 CLINICAL DATA:  Abdominal pain x3 days. EXAM: CT ABDOMEN AND PELVIS WITH CONTRAST TECHNIQUE: Multidetector CT imaging of the abdomen and pelvis was performed using the standard protocol following bolus administration of intravenous contrast. RADIATION DOSE REDUCTION: This exam was performed according to the departmental dose-optimization program which includes automated exposure control, adjustment of the mA and/or kV according to patient size and/or use of iterative reconstruction technique. CONTRAST:  100mL OMNIPAQUE  IOHEXOL  300 MG/ML  SOLN COMPARISON:  None Available. FINDINGS: Lower chest: No acute abnormality. Hepatobiliary: No focal liver abnormality is seen. No gallstones, gallbladder wall thickening, or biliary dilatation. Pancreas: Unremarkable. No pancreatic ductal dilatation or surrounding inflammatory changes. Spleen: Normal in size without focal abnormality. Adrenals/Urinary Tract: Adrenal glands are unremarkable. Kidneys are normal, without renal calculi, focal lesion, or hydronephrosis. Bladder is unremarkable. Stomach/Bowel: Stomach is within normal limits. Surgically anastomosed bowel is seen within the bilateral lower quadrants. Thickened and inflamed loops of ileum are seen extending up to the level of surgically anastomosed bowel (axial CT images 57 through 69, CT series 2).  No evidence of bowel dilatation. Vascular/Lymphatic: No significant vascular findings are present. No enlarged abdominal or pelvic lymph nodes. Reproductive: A 1.7 cm simple cyst is seen within the left adnexa. The uterus and right adnexa are unremarkable. Other: No abdominal wall hernia or abnormality. No abdominopelvic ascites. Musculoskeletal: No acute or significant osseous findings. IMPRESSION: 1. Findings consistent with ileitis. 2. 1.7 cm simple left adnexal cyst, likely ovarian in origin. No follow-up imaging is recommended. This recommendation follows ACR consensus guidelines: White Paper of the ACR Incidental Findings Committee II on Adnexal Findings. J Am Coll Radiol [phone]. Electronically Signed   By: Virgle Grime M.D.   On: 11/15/2023 22:38   Assessment/Plan Connie Williamson is a 28 y.o. female with medical history significant for insulin -dependent type 2 diabetes, Crohn's disease status post small bowel resection 2018 previously on Remicade currently not on medications being admitted to the hospital with ileitis.  Crohn's flare-with abdominal pain, constipation, and ileitis. -Inpatient admission -Clear liquid diet as tolerated -Pain and nausea medication as needed -Empiric IV steroids, GI consultation is appreciated -Received empiric IV antibiotics, these can potentially be discontinued will await further GI recommendations  Insulin -dependent type 2 diabetes-will update hemoglobin  A1c, placed on moderate dose sliding scale insulin .  Will resume basal insulin  as diet is advanced.  DVT prophylaxis: Lovenox     Code Status: Full Code  Consults called: [facility]  Admission status: The appropriate patient status for this patient is INPATIENT. Inpatient status is judged to be reasonable and necessary in order to provide the required intensity of service to ensure the patient's safety. The patient's presenting symptoms, physical exam findings, and initial radiographic and  laboratory data in the context of their chronic comorbidities is felt to place them at high risk for further clinical deterioration. Furthermore, it is not anticipated that the patient will be medically stable for discharge from the hospital within 2 midnights of admission.    I certify that at the point of admission it is my clinical judgment that the patient will require inpatient hospital care spanning beyond 2 midnights from the point of admission due to high intensity of service, high risk for further deterioration and high frequency of surveillance required  Time spent: 59 minutes  Salimatou Simone Rickey Charm MD [facility] Pager [phone]  If 7PM-7AM, please contact night-coverage [email or web address] Password [facility]  11/16/2023, 9:54 AM

## 2023-11-16 NOTE — Plan of Care (Signed)
 Plan of Care Note for accepted transfer   Patient name: Connie Williamson ZOX:[id number] DOB: [birth year]  Facility requesting transfer: [facility] Requesting Provider: Rexie Catena, PA-C  Facility course: 28 year old female with history of Crohn's disease with previous bowel resection, hypertension, type 2 diabetes, asthma presented to the ED with complaints of generalized abdominal pain and constipation x 3 days.  Tachycardic but afebrile.  Not hypotensive.  Labs notable for WBC count 16.5, hemoglobin 11.3, glucose 186, creatinine 0.7, no elevation of lipase and LFTs, UA not suggestive of infection, urine pregnancy test negative.  CT showing findings consistent with ileitis. Patient was given fentanyl , Zofran , 1 L LR, and IV Decadron.  ED PA discussed the case with Dr. Veronda Goody and [facility] will see the patient in consultation.  Patient will be started on antibiotics in the ED.  Plan of care: The patient is accepted for admission to Progressive unit at [facility]  [facility] will assume care on arrival to accepting facility. Until arrival, care as per EDP. However, [facility] available 24/7 for questions and assistance.  Check [email or web address] for on-call coverage.  Nursing staff, please call [facility] Admits & Consults System-Wide number under Amion on patient's arrival so appropriate admitting provider can evaluate the pt.

## 2023-11-16 NOTE — Consult Note (Signed)
 Referring Provider: ED W Primary Care Physician:  Paseda, Folashade R, FNP Primary Gastroenterologist: [facility]  Reason for Consultation: Abdominal pain, history of Crohn's disease  HPI: Connie Williamson is a 28 y.o. female with past medical history of complicated Crohn's disease with Crohn's ileitis and colitis diagnosed at age 10 somewhere around 2012, status post ex lap with lysis of adhesions with small bowel resection in 2018, previously on Remicade but currently not on any maintenance medication because of lack of insurance presented to the emergency department with abdominal pain and constipation.  Blood work showed mild elevated white count at 16.5, mild anemia with hemoglobin of 11.3, normal CMP, normal lipase and negative urine pregnancy test.  CT abdomen pelvis with IV contrast showed surgical anatomy with thickening and inflammation of the loops of ileum extending to the surgical anastomosis consistent with ileitis.  Patient is transferred to [facility] and GI is consulted for further management.  Patient seen and examined at bedside.  According to patient, she is not been on any maintenance medication for Crohn's disease since few months after her surgery in 2018.  She usually have loose stools but around 3 to 4 days ago she started having abdominal discomfort and constipation.  No bowel movement since last 3 to 4 days.  Had some nausea but denies any vomiting.  Denies seeing any blood in the stool or black stool.  Denies NSAID use.  Past Medical History:  Diagnosis Date   Asthma    Crohn's disease (HCC)    Diabetes mellitus    Intramural leiomyoma of uterus    Menorrhagia     Past Surgical History:  Procedure Laterality Date   ADENOIDECTOMY     BOWEL RESECTION     TONSILLECTOMY      Prior to Admission medications   Medication Sig Start Date End Date Taking? Authorizing Provider  cholecalciferol (VITAMIN D3) 25 MCG (1000 UNIT) tablet  Take 1,000 Units by mouth daily.    [provider]  Continuous Glucose Sensor (DEXCOM G7 SENSOR) MISC Please use as directed. 01/23/23   Paseda, Folashade R, FNP  ibuprofen  (ADVIL ,MOTRIN ) 200 MG tablet Take 400 mg by mouth 2 (two) times daily as needed (pain).    [provider]  insulin  degludec (TRESIBA  FLEXTOUCH) 100 UNIT/ML FlexTouch Pen Inject 40 Units into the skin at bedtime. 10/11/23   Paseda, Folashade R, FNP  Insulin  Pen Needle (PEN NEEDLES) 32G X 4 MM MISC Use as directed with insulin  pens 12/11/22   Rosealee Concha, MD  metFORMIN  (GLUCOPHAGE ) 500 MG tablet Take 2 tablets (1,000 mg total) by mouth 2 (two) times daily with a meal. 03/19/23   Paseda, Folashade R, FNP  MYFEMBREE 40-1-0.5 MG TABS Take 1 tablet by mouth daily. 10/29/22   [provider]  ondansetron  (ZOFRAN  ODT) 4 MG disintegrating tablet Take 1 tablet (4 mg total) by mouth every 8 (eight) hours as needed for nausea. 01/25/15   Piepenbrink, Bridgette Campus, PA-C  Semaglutide ,0.25 or 0.5MG /DOS, 2 MG/3ML SOPN Inject 0.5 mg into the skin once a week. 10/08/23   Paseda, Folashade R, FNP  SYEDA 3-0.03 MG tablet Take 1 tablet by mouth daily. 10/29/22   [provider]    Scheduled Meds: Continuous Infusions:  metronidazole  Stopped (11/16/23 0359)   PRN Meds:.fentaNYL  (SUBLIMAZE ) injection  Allergies as of 11/15/2023   (No Known Allergies)    Family History  Problem Relation Age of Onset   Diabetes Mother    Kidney failure Mother  Hypertension Mother    Healthy Father    Healthy Sister    Healthy Brother    Diabetes Maternal Grandmother    Hypertension Maternal Grandmother    Diabetes Maternal Grandfather    Hypertension Maternal Grandfather     Social History   Socioeconomic History   Marital status: Significant Other    Spouse name: Not on file   Number of children: Not on file   Years of education: Not on file   Highest education level: Associate degree: academic program  Occupational  History   Not on file  Tobacco Use   Smoking status: Never   Smokeless tobacco: Never  Substance and Sexual Activity   Alcohol use: Not Currently    Comment: Socially   Drug use: No   Sexual activity: Yes  Other Topics Concern   Not on file  Social History Narrative   Lives with a room mate.   Social Drivers of Corporate investment banker Strain: Low Risk  (10/04/2023)   Overall Financial Resource Strain (CARDIA)    Difficulty of Paying Living Expenses: Not very hard  Food Insecurity: No Food Insecurity (10/04/2023)   Hunger Vital Sign    Worried About Running Out of Food in the Last Year: Never true    Ran Out of Food in the Last Year: Never true  Transportation Needs: No Transportation Needs (10/04/2023)   PRAPARE - Administrator, Civil Service (Medical): No    Lack of Transportation (Non-Medical): No  Physical Activity: Insufficiently Active (10/04/2023)   Exercise Vital Sign    Days of Exercise per Week: 1 day    Minutes of Exercise per Session: 10 min  Stress: Stress Concern Present (10/04/2023)   [employer] of Occupational Health - Occupational Stress Questionnaire    Feeling of Stress : Rather much  Social Connections: Moderately Integrated (10/04/2023)   Social Connection and Isolation Panel [NHANES]    Frequency of Communication with Friends and Family: Three times a week    Frequency of Social Gatherings with Friends and Family: Once a week    Attends Religious Services: 1 to 4 times per year    Active Member of [employer] or Organizations: No    Attends Engineer, structural: Not on file    Marital Status: Living with partner  Intimate Partner Violence: Unknown (10/11/2021)   Received from [employer], [facility]   HITS    Physically Hurt: Not on file    Insult or Talk Down To: Not on file    Threaten Physical Harm: Not on file    Scream or Curse: Not on file    Review of Systems: All negative except as stated above in HPI.  Physical  Exam: Vital signs: Vitals:   11/16/23 0818 11/16/23 0830  BP: (!) 88/54 92/61  Pulse: (!) 105 (!) 106  Resp: 20 20  Temp:  98 F (36.7 C)  SpO2: 99% 99%     Physical Exam Vitals reviewed.  Constitutional:      General: She is not in acute distress.    Appearance: She is well-developed.  HENT:     Head: Normocephalic and atraumatic.  Eyes:     Extraocular Movements: Extraocular movements intact.  Cardiovascular:     Rate and Rhythm: Normal rate and regular rhythm.     Heart sounds: Normal heart sounds. No murmur heard. Pulmonary:     Effort: Pulmonary effort is normal. No respiratory distress.  Abdominal:  General: Bowel sounds are normal. There is no distension.     Palpations: Abdomen is soft.     Tenderness: There is no abdominal tenderness.  Skin:    General: Skin is warm.     Coloration: Skin is not jaundiced.  Neurological:     General: No focal deficit present.     Mental Status: She is alert.  Psychiatric:        Mood and Affect: Mood normal. Mood is not anxious or depressed.        Behavior: Behavior normal.      GI:  Lab Results: Recent Labs    11/15/23 1648  WBC 16.5*  HGB 11.3*  HCT 35.9*  PLT 471*   BMET Recent Labs    11/15/23 2020  NA 135  K 4.0  CL 99  CO2 24  GLUCOSE 186*  BUN 6  CREATININE 0.78  CALCIUM 9.7   LFT Recent Labs    11/15/23 2020  PROT 7.0  ALBUMIN 3.8  AST 9*  ALT <5  ALKPHOS 114  BILITOT 0.2   PT/INR No results for input(s): "LABPROT", "INR" in the last 72 hours.   Studies/Results: CT ABDOMEN PELVIS W CONTRAST Result Date: 11/15/2023 CLINICAL DATA:  Abdominal pain x3 days. EXAM: CT ABDOMEN AND PELVIS WITH CONTRAST TECHNIQUE: Multidetector CT imaging of the abdomen and pelvis was performed using the standard protocol following bolus administration of intravenous contrast. RADIATION DOSE REDUCTION: This exam was performed according to the departmental dose-optimization program which includes automated  exposure control, adjustment of the mA and/or kV according to patient size and/or use of iterative reconstruction technique. CONTRAST:  OMNIPAQUE  IOHEXOL  300 MG/ML  SOLN COMPARISON:  None Available. FINDINGS: Lower chest: No acute abnormality. Hepatobiliary: No focal liver abnormality is seen. No gallstones, gallbladder wall thickening, or biliary dilatation. Pancreas: Unremarkable. No pancreatic ductal dilatation or surrounding inflammatory changes. Spleen: Normal in size without focal abnormality. Adrenals/Urinary Tract: Adrenal glands are unremarkable. Kidneys are normal, without renal calculi, focal lesion, or hydronephrosis. Bladder is unremarkable. Stomach/Bowel: Stomach is within normal limits. Surgically anastomosed bowel is seen within the bilateral lower quadrants. Thickened and inflamed loops of ileum are seen extending up to the level of surgically anastomosed bowel (axial CT images 57 through 69, CT series 2). No evidence of bowel dilatation. Vascular/Lymphatic: No significant vascular findings are present. No enlarged abdominal or pelvic lymph nodes. Reproductive: A 1.7 cm simple cyst is seen within the left adnexa. The uterus and right adnexa are unremarkable. Other: No abdominal wall hernia or abnormality. No abdominopelvic ascites. Musculoskeletal: No acute or significant osseous findings. IMPRESSION: 1. Findings consistent with ileitis. 2. 1.7 cm simple left adnexal cyst, likely ovarian in origin. No follow-up imaging is recommended. This recommendation follows ACR consensus guidelines: White Paper of the ACR Incidental Findings Committee II on Adnexal Findings. J Am Coll Radiol [phone]. Electronically Signed   By: Virgle Grime M.D.   On: 11/15/2023 22:38    Impression/Plan: -History of Crohn's disease diagnosed at age 22 status post status post ex lap with lysis of adhesions with small bowel resection in 2018 with ileocolonic anastomosis presented with abdominal pain and  constipation.  CT scan showed finding consistent with active ileitis.  Likely Crohn's flare.  Not on any maintenance medication.  Previously used to be on Remicade.  Recommendations -------------------------- - Start IV Solu-Medrol - Continue antibiotics for now for leukocytosis repeat labs tomorrow. - Importance of being on maintenance medication discussed with the patient.  She  was advised to follow-up with her primary GI after discharge to discuss newer biological agents for her Crohn's disease. - Avoid NSAIDs - Start full liquid diet and slowly advance as tolerated -Add MiraLAX for constipation. - GI will follow     LOS: 0 days   Felecia Hopper  MD, FACP 11/16/2023, 8:46 AM  Contact #  [phone]

## 2023-11-16 NOTE — ED Notes (Signed)
 [facility] called for transport

## 2023-11-17 DIAGNOSIS — K529 Noninfective gastroenteritis and colitis, unspecified: Secondary | ICD-10-CM | POA: Diagnosis not present

## 2023-11-17 LAB — COMPREHENSIVE METABOLIC PANEL WITH GFR
ALT: 8 U/L (ref 0–44)
AST: 5 U/L — ABNORMAL LOW (ref 15–41)
Albumin: 2.7 g/dL — ABNORMAL LOW (ref 3.5–5.0)
Alkaline Phosphatase: 80 U/L (ref 38–126)
Anion gap: 10 (ref 5–15)
BUN: 12 mg/dL (ref 6–20)
CO2: 23 mmol/L (ref 22–32)
Calcium: 8.6 mg/dL — ABNORMAL LOW (ref 8.9–10.3)
Chloride: 101 mmol/L (ref 98–111)
Creatinine, Ser: 0.63 mg/dL (ref 0.44–1.00)
GFR, Estimated: >60 mL/min (ref >60)
Glucose, Bld: 348 mg/dL — ABNORMAL HIGH (ref 70–99)
Potassium: 4.3 mmol/L (ref 3.5–5.1)
Sodium: 134 mmol/L — ABNORMAL LOW (ref 135–145)
Total Bilirubin: 0.7 mg/dL (ref 0.0–1.2)
Total Protein: 6.9 g/dL (ref 6.5–8.1)

## 2023-11-17 LAB — CBC
HCT: 34.9 % — ABNORMAL LOW (ref 36.0–46.0)
Hemoglobin: 10.4 g/dL — ABNORMAL LOW (ref 12.0–15.0)
MCH: 24.1 pg — ABNORMAL LOW (ref 26.0–34.0)
MCHC: 29.8 g/dL — ABNORMAL LOW (ref 30.0–36.0)
MCV: 80.8 fL (ref 80.0–100.0)
Platelets: 575 10*3/uL — ABNORMAL HIGH (ref 150–400)
RBC: 4.32 MIL/uL (ref 3.87–5.11)
RDW: 15.1 % (ref 11.5–15.5)
WBC: 14.1 10*3/uL — ABNORMAL HIGH (ref 4.0–10.5)
nRBC: 0 % (ref 0.0–0.2)

## 2023-11-17 LAB — GLUCOSE, CAPILLARY
Glucose-Capillary: 318 mg/dL — ABNORMAL HIGH (ref 70–99)
Glucose-Capillary: 244 mg/dL — ABNORMAL HIGH (ref 70–99)

## 2023-11-17 MED ORDER — METRONIDAZOLE 500 MG PO TABS: 500.0000 mg | ORAL_TABLET | Freq: Two times a day (BID) | ORAL | 0 refills | Status: AC

## 2023-11-17 MED ORDER — CIPROFLOXACIN HCL 500 MG PO TABS: 500.0000 mg | ORAL_TABLET | Freq: Two times a day (BID) | ORAL | 0 refills | Status: AC

## 2023-11-17 MED ORDER — POLYETHYLENE GLYCOL 3350 17 GM/SCOOP PO POWD: 17.0000 g | Freq: Two times a day (BID) | ORAL | 0 refills | Status: AC

## 2023-11-17 MED ORDER — PREDNISONE 10 MG PO TABS: ORAL_TABLET | ORAL | 0 refills | Status: DC

## 2023-11-17 NOTE — Plan of Care (Signed)

## 2023-11-17 NOTE — Discharge Instructions (Addendum)
 Connie Williamson,  You were in the hospital because of a Crohn's flare. This has improved with steroids and antibiotics. The GI physician has recommended you follow-up as an outpatient and continue your steroid taper and steroids.

## 2023-11-17 NOTE — Discharge Summary (Signed)
 Physician Discharge Summary   Patient: Connie Williamson MRN: [id number] DOB: 17-Sep-1995  Admit date:     11/15/2023  Discharge date: 11/17/23  Discharge Physician: Aneita Keens, MD   PCP: Paseda, Folashade R, FNP   Recommendations at discharge:  PCP visit for hospital follow-up GI follow-up for continued management of Crohn's Repeat CBC/CMP in 5-7 days  Discharge Diagnoses: Principal Problem:   Ileitis Active Problems:   Uncontrolled type 2 diabetes mellitus with hyperglycemia, with long-term current use of insulin  (HCC)   Obesity (BMI 30-39.9)   Crohn's disease (HCC)  Resolved Problems:   * No resolved hospital problems. *  Hospital Course: Connie Williamson is a 28 y.o. female with a history of diabetes mellitus, Crohn's disease s/p small bowel resection, iron deficiency anemia.  Patient presented secondary to abdominal pain and constipation and found to have evidence of ileitis. GI consulted. Patient started empirically on steroids and antibiotics. Patient also started on MiraLAX for management of constipation. Recommendation to continue steroids, antibiotics and MiraLAX on discharge.  Assessment and Plan:  Crohn's flare Abdominal pain on admission. CT abdomen/pelvis significant for ileitis. Not on maintenance medication, although was previously on Remicade. GI consulted. Patient started on antibiotics and Solu-medrol with improvement of symptoms. GI recommendations for discharge with prednisone  with taper, antibiotics and MiraLAX.   Diabetes mellitus type 2 Uncontrolled with hyperglycemia based on hemoglobin A1C of 12.6% from 01/2023. Patient is managed on Tresiba  40 units at bedtime and Semaglutide  0.5 mg as an outpatient. SSI started on admission. Complicated by Solu-medrol use currently. Continue home regimen. Hemoglobin A1C pending on discharge.   Acute anemia History of Iron deficiency anemia Patient has had anemia in the past, which has improved based on most recent  hemoglobin from 2024. Hemoglobin of 11.3 on admission with slight downtrend. Outpatient follow-up.   Thrombocytosis In setting of acute illness. Likely reactive.  Left adnexal cyst Presumed ovarian in origin. Measuring 1.7 cm. No follow-up recommended.  Obesity, class II Estimated body mass index is 36.33 kg/m as calculated from the following:   Height as of this encounter: 5\' 2"  (1.575 m).   Weight as of this encounter: 90.1 kg.   Consultants: [facility] Procedures performed: None  Disposition: Home Diet recommendation: Soft diet   DISCHARGE MEDICATION: Allergies as of 11/17/2023   No Known Allergies      Medication List     STOP taking these medications    ibuprofen  200 MG tablet Commonly known as: ADVIL    Syeda 3-0.03 MG tablet Generic drug: drospirenone-ethinyl estradiol       TAKE these medications    ciprofloxacin  500 MG tablet Commonly known as: CIPRO  Take 1 tablet (500 mg total) by mouth 2 (two) times daily for 7 days.   Dexcom G7 Sensor Misc Please use as directed.   metroNIDAZOLE  500 MG tablet Commonly known as: Flagyl  Take 1 tablet (500 mg total) by mouth 2 (two) times daily for 7 days.   ondansetron  4 MG disintegrating tablet Commonly known as: Zofran  ODT Take 1 tablet (4 mg total) by mouth every 8 (eight) hours as needed for nausea. What changed: when to take this   polyethylene glycol powder 17 GM/SCOOP powder Commonly known as: MiraLax Take 17 g by mouth 2 (two) times daily. Decrease to daily if you develop diarrhea.   predniSONE  10 MG tablet Commonly known as: DELTASONE  Take 4 tablets (40 mg total) by mouth daily with breakfast for 7 days, THEN 3 tablets (30 mg total) daily with  breakfast for 7 days, THEN 2 tablets (20 mg total) daily with breakfast for 7 days, THEN 1 tablet (10 mg total) daily with breakfast. Start taking on: Nov 18, 2023   Semaglutide (0.25 or 0.5MG /DOS) 2 MG/3ML Sopn Inject 0.5 mg into the skin once a  week.   Tresiba  FlexTouch 100 UNIT/ML FlexTouch Pen Generic drug: insulin  degludec Inject 40 Units into the skin at bedtime.        Discharge Exam: BP 118/86 (BP Location: Left Arm)   Pulse 95   Temp 98 F (36.7 C) (Oral)   Resp 20   Ht 5\' 2"  (1.575 m)   Wt 90.1 kg   LMP 11/04/2023 (Approximate)   SpO2 100%   BMI 36.33 kg/m   General exam: Appears calm and comfortable Respiratory system: Clear to auscultation. Respiratory effort normal. Cardiovascular system: S1 & S2 heard, RRR. No murmurs. Gastrointestinal system: Abdomen is nondistended, soft and nontender. Normal bowel sounds heard. Central nervous system: Alert and oriented. No focal neurological deficits. Musculoskeletal: No edema. No calf tenderness Psychiatry: Judgement and insight appear normal. Mood & affect appropriate.   Condition at discharge: stable  The results of significant diagnostics from this hospitalization (including imaging, microbiology, ancillary and laboratory) are listed below for reference.   Imaging Studies: CT ABDOMEN PELVIS W CONTRAST Result Date: 11/15/2023 CLINICAL DATA:  Abdominal pain x3 days. EXAM: CT ABDOMEN AND PELVIS WITH CONTRAST TECHNIQUE: Multidetector CT imaging of the abdomen and pelvis was performed using the standard protocol following bolus administration of intravenous contrast. RADIATION DOSE REDUCTION: This exam was performed according to the departmental dose-optimization program which includes automated exposure control, adjustment of the mA and/or kV according to patient size and/or use of iterative reconstruction technique. CONTRAST:  OMNIPAQUE  IOHEXOL  300 MG/ML  SOLN COMPARISON:  None Available. FINDINGS: Lower chest: No acute abnormality. Hepatobiliary: No focal liver abnormality is seen. No gallstones, gallbladder wall thickening, or biliary dilatation. Pancreas: Unremarkable. No pancreatic ductal dilatation or surrounding inflammatory changes. Spleen: Normal in size  without focal abnormality. Adrenals/Urinary Tract: Adrenal glands are unremarkable. Kidneys are normal, without renal calculi, focal lesion, or hydronephrosis. Bladder is unremarkable. Stomach/Bowel: Stomach is within normal limits. Surgically anastomosed bowel is seen within the bilateral lower quadrants. Thickened and inflamed loops of ileum are seen extending up to the level of surgically anastomosed bowel (axial CT images 57 through 69, CT series 2). No evidence of bowel dilatation. Vascular/Lymphatic: No significant vascular findings are present. No enlarged abdominal or pelvic lymph nodes. Reproductive: A 1.7 cm simple cyst is seen within the left adnexa. The uterus and right adnexa are unremarkable. Other: No abdominal wall hernia or abnormality. No abdominopelvic ascites. Musculoskeletal: No acute or significant osseous findings. IMPRESSION: 1. Findings consistent with ileitis. 2. 1.7 cm simple left adnexal cyst, likely ovarian in origin. No follow-up imaging is recommended. This recommendation follows ACR consensus guidelines: White Paper of the ACR Incidental Findings Committee II on Adnexal Findings. J Am Coll Radiol [phone]. Electronically Signed   By: Virgle Grime M.D.   On: 11/15/2023 22:38    Microbiology: Results for orders placed or performed during the hospital encounter of 12/11/22  SARS Coronavirus 2 by RT PCR (hospital order, performed in [facility] hospital lab) *cepheid single result test* Anterior Nasal Swab     Status: None   Collection Time: 12/11/22  9:34 PM   Specimen: Anterior Nasal Swab  Result Value Ref Range Status   SARS Coronavirus 2 by RT PCR NEGATIVE NEGATIVE Final  Comment: (NOTE) SARS-CoV-2 target nucleic acids are NOT DETECTED.  The SARS-CoV-2 RNA is generally detectable in upper and lower respiratory specimens during the acute phase of infection. The lowest concentration of SARS-CoV-2 viral copies this assay can detect is 250 copies / mL. A  negative result does not preclude SARS-CoV-2 infection and should not be used as the sole basis for treatment or other patient management decisions.  A negative result may occur with improper specimen collection / handling, submission of specimen other than nasopharyngeal swab, presence of viral mutation(s) within the areas targeted by this assay, and inadequate number of viral copies (<250 copies / mL). A negative result must be combined with clinical observations, patient history, and epidemiological information.  Fact Sheet for Patients:   RoadLapTop.co.za  Fact Sheet for Healthcare Providers: http://kim-miller.com/  This test is not yet approved or  cleared by the [place]  FDA and has been authorized for detection and/or diagnosis of SARS-CoV-2 by FDA under an Emergency Use Authorization (EUA).  This EUA will remain in effect (meaning this test can be used) for the duration of the COVID-19 declaration under Section 564(b)(1) of the Act, 21 U.S.C. section 360bbb-3(b)(1), unless the authorization is terminated or revoked sooner.  Performed at [facility], [address] [place], [place] [address]     Labs: CBC: Recent Labs  Lab 11/15/23 1648 11/16/23 1103 11/17/23 0602  WBC 16.5* 12.6* 14.1*  HGB 11.3* 10.7* 10.4*  HCT 35.9* 35.9* 34.9*  MCV 76.5* 81.8 80.8  PLT 471* 499* 575*   Basic Metabolic Panel: Recent Labs  Lab 11/15/23 2020 11/16/23 1103 11/17/23 0602  NA 135 133* 134*  K 4.0 4.7 4.3  CL 99 100 101  CO2 24 22 23   GLUCOSE 186* 321* 348*  BUN 6 11 12   CREATININE 0.78 0.78 0.63  CALCIUM 9.7 8.9 8.6*   Liver Function Tests: Recent Labs  Lab 11/15/23 2020 11/16/23 1103 11/17/23 0602  AST 9* 8* 5*  ALT 5 9 8   ALKPHOS 114 95 80  BILITOT 0.2 0.8 0.7  PROT 7.0 7.5 6.9  ALBUMIN 3.8 3.1* 2.7*   CBG: Recent Labs  Lab 11/16/23 1031 11/16/23 1547 11/16/23 2115 11/17/23 0733  11/17/23 1158  GLUCAP 330* 304* 284* 318* 244*    Discharge time spent: 35 minutes.  Signed: Aneita Keens, MD [facility] 11/17/2023

## 2023-11-17 NOTE — TOC Initial Note (Signed)
 Transition of Care [facility]) - Initial/Assessment Note    Patient Details  Name: Connie Williamson MRN: [id number] Date of Birth: 1995-10-28  [facility]) CM/SW Contact:    Levie Ream, RN Phone Number: 11/17/2023, 1:16 PM  Clinical Narrative:                 Eldora Greet w/ pt in room; pt says she lives at home w/ her partner; she plans to return at d/c; she identified POC Melvina Stage (mother) [phone]; pt will arrange transportation; she verified insurance/PCP; pt denied SDOH risks; pt says she does not have DME, HH services, or home oxygen; no TOC needs.  Expected Discharge Plan: Home/Self Care Barriers to Discharge: No Barriers Identified   Patient Goals and CMS Choice Patient states their goals for this hospitalization and ongoing recovery are:: home          Expected Discharge Plan and Services   Discharge Planning Services: CM Consult Post Acute Care Choice: NA Living arrangements for the past 2 months: Apartment Expected Discharge Date: 11/17/23               DME Arranged: N/A DME Agency: NA       HH Arranged: NA HH Agency: NA        Prior Living Arrangements/Services Living arrangements for the past 2 months: Apartment Lives with:: Significant Other Patient language and need for interpreter reviewed:: Yes Do you feel safe going back to the place where you live?: Yes      Need for Family Participation in Patient Care: Yes (Comment) Care giver support system in place?: Yes (comment) Current home services:  (n/a) Criminal Activity/Legal Involvement Pertinent to Current Situation/Hospitalization: No - Comment as needed  Activities of Daily Living   ADL Screening (condition at time of admission) Independently performs ADLs?: Yes (appropriate for developmental age) Is the patient deaf or have difficulty hearing?: No Does the patient have difficulty seeing, even when wearing glasses/contacts?: No Does the patient have difficulty  concentrating, remembering, or making decisions?: No  Permission Sought/Granted Permission sought to share information with : Case Manager Permission granted to share information with : Yes, Verbal Permission Granted  Share Information with NAME: Case Manager     Permission granted to share info w Relationship: Melvina Stage (mother) [phone]     Emotional Assessment Appearance:: Appears stated age Attitude/Demeanor/Rapport: Gracious Affect (typically observed): Accepting Orientation: : Oriented to Self, Oriented to Place, Oriented to  Time, Oriented to Situation Alcohol / Substance Use: Not Applicable Psych Involvement: No (comment)  Admission diagnosis:  Ileitis [K52.9] Patient Active Problem List   Diagnosis Date Noted   Ileitis 11/16/2023   Need for diphtheria-tetanus-pertussis (Tdap) vaccine 01/23/2023   Iron deficiency anemia 01/23/2023   Cellulitis of right lower extremity 03/05/2022   Sepsis (HCC) 03/05/2022   Acute anemia 03/05/2022   Abscess of right thigh 03/05/2022   Obesity (BMI 30-39.9) 03/05/2022   Crohn's disease (HCC) 03/05/2022   HYPOXEMIA - NOCTURNAL 11/01/2008   Sleep apnea 09/16/2008   Viral warts 08/19/2008   Uncontrolled type 2 diabetes mellitus with hyperglycemia, with long-term current use of insulin  (HCC) 08/05/2008   HYPERTENSION, BENIGN ESSENTIAL 08/05/2008   Asthma 08/05/2008   PCP:  Paseda, Folashade R, FNP Pharmacy:   [id number] - [place], [place] - [address] [place] [address] Phone: [phone] Fax: [phone]     Social Drivers of Health (SDOH) Social History: SDOH Screenings   Food Insecurity: No Food Insecurity (  11/17/2023)  Housing: Low Risk  (11/17/2023)  Transportation Needs: No Transportation Needs (11/17/2023)  Utilities: Not At Risk (11/17/2023)  Alcohol Screen: Low Risk  (10/04/2023)  Depression (PHQ2-9): Low Risk  (01/23/2023)  Financial Resource Strain: Low Risk  (10/04/2023)   Physical Activity: Insufficiently Active (10/04/2023)  Social Connections: Moderately Integrated (10/04/2023)  Stress: Stress Concern Present (10/04/2023)  Tobacco Use: Low Risk  (11/15/2023)   SDOH Interventions: Food Insecurity Interventions: Intervention Not Indicated, Inpatient TOC Housing Interventions: Intervention Not Indicated, Inpatient TOC Transportation Interventions: Intervention Not Indicated, Inpatient TOC Utilities Interventions: Intervention Not Indicated, Inpatient TOC   Readmission Risk Interventions     No data to display

## 2023-11-17 NOTE — Progress Notes (Signed)
 [facility]enterology Progress Note  KANANI ERNEY 28 y.o. 1995-09-12  CC: Crohn's disease   Subjective: Patient seen and examined at bedside.  Feeling significantly better.  Nausea has improved significantly.  Denies any vomiting.  Abdominal pain also improved.  No bowel movement yet.  ROS : Negative for nausea and vomiting.  Negative for fever   Objective: Vital signs in last 24 hours: Vitals:   11/16/23 2118 11/17/23 0605  BP: 109/74 118/86  Pulse: 94 95  Resp: 20   Temp: 98.8 F (37.1 C) 98 F (36.7 C)  SpO2: 99% 100%    Physical Exam:  General:  Alert, cooperative, no distress, appears stated age  Head:  Normocephalic, without obvious abnormality, atraumatic  Eyes:  , EOM's intact,   Lungs:   Clear to auscultation bilaterally, respirations unlabored  Heart:  Regular rate and rhythm, S1, S2 normal  Abdomen:   Soft, non-tender, nondistended, bowel sound present, no peritoneal signs  Extremities: Extremities normal, atraumatic, no  edema  Pulses: 2+ and symmetric    Lab Results: Recent Labs    11/16/23 1103 11/17/23 0602  NA 133* 134*  K 4.7 4.3  CL 100 101  CO2 22 23  GLUCOSE 321* 348*  BUN 11 12  CREATININE 0.78 0.63  CALCIUM 8.9 8.6*   Recent Labs    11/16/23 1103 11/17/23 0602  AST 8* 5*  ALT 9 8  ALKPHOS 95 80  BILITOT 0.8 0.7  PROT 7.5 6.9  ALBUMIN 3.1* 2.7*   Recent Labs    11/16/23 1103 11/17/23 0602  WBC 12.6* 14.1*  HGB 10.7* 10.4*  HCT 35.9* 34.9*  MCV 81.8 80.8  PLT 499* 575*   No results for input(s): "LABPROT", "INR" in the last 72 hours.    Assessment/Plan: -History of Crohn's disease diagnosed at age 39 status post status post ex lap with lysis of adhesions with small bowel resection in 2018 with ileocolonic anastomosis presented with abdominal pain and constipation.  CT scan showed finding consistent with active ileitis.  Likely Crohn's flare.  Not on any maintenance medication.  Previously used to be on  Remicade.   Recommendations -------------------------- - Significant improvement in symptoms with IV Solu-Medrol and antibiotics. - Advance diet to soft - Okay to discharge later today if able to tolerate soft diet on ciprofloxacin  and Flagyl  for 7 days as well as prednisone  taper with prednisone  40 mg once a day for 1 week followed by prednisone  30 mg for 1 week and then prednisone  20 mg for 1 week and then prednisone  10 mg. - Patient will set up appointment with her primary GI for follow-up and to discuss further management of her complicated Crohn's disease. - Recommend to continue MiraLAX twice a day for now. - No further inpatient GI workup planned.  GI will sign off.  Call us  back if needed.   Felecia Hopper MD, FACP 11/17/2023, 10:22 AM  Contact #  [phone]

## 2023-11-17 NOTE — Hospital Course (Signed)
 Connie Williamson is a 28 y.o. female with a history of diabetes mellitus, Crohn's disease s/p small bowel resection, iron deficiency anemia.  Patient presented secondary to abdominal pain and constipation and found to have evidence of ileitis. GI consulted. Patient started empirically on steroids and antibiotics. Patient also started on MiraLAX for management of constipation. Recommendation to continue steroids, antibiotics and MiraLAX on discharge.

## 2023-11-18 ENCOUNTER — Telehealth: Payer: Self-pay

## 2023-11-18 LAB — HEMOGLOBIN A1C
Hgb A1c MFr Bld: 10.9 % — ABNORMAL HIGH (ref 4.8–5.6)
Mean Plasma Glucose: 266 mg/dL

## 2023-11-18 NOTE — Transitions of Care (Post Inpatient/ED Visit) (Signed)
 11/18/2023  Name: Connie Williamson MRN: [id number] DOB: 1996/07/06  Today's TOC FU Call Status: Today's TOC FU Call Status:: Successful TOC FU Call Completed TOC FU Call Complete Date: 11/18/23 Patient's Name and Date of Birth confirmed.  Transition Care Management Follow-up Telephone Call Date of Discharge: 11/17/23 Discharge Facility: [facility]) Type of Discharge: Inpatient Admission Primary Inpatient Discharge Diagnosis:: gastroenteritis How have you been since you were released from the hospital?: Better Any questions or concerns?: No  Items Reviewed: Did you receive and understand the discharge instructions provided?: Yes Medications obtained,verified, and reconciled?: Yes (Medications Reviewed) Dietary orders reviewed?: Yes Do you have support at home?: Yes People in Home [RPT]: significant other  Medications Reviewed Today: Medications Reviewed Today     Reviewed by Darrall Ellison, LPN (Licensed Practical Nurse) on 11/18/23 at 1110  Med List Status: <None>   Medication Order Taking? Sig Documenting Provider Last Dose Status Informant  ciprofloxacin  (CIPRO ) 500 MG tablet [id number]  Take 1 tablet (500 mg total) by mouth 2 (two) times daily for 7 days. Verlyn Goad, MD  Active   Continuous Glucose Sensor (DEXCOM G7 SENSOR) [place] [id number]  Please use as directed. Paseda, Folashade R, FNP  Active Self, Pharmacy Records  insulin  degludec (TRESIBA  FLEXTOUCH) 100 UNIT/ML FlexTouch Pen [id number] No Inject 40 Units into the skin at bedtime. Paseda, Folashade R, FNP 11/13/2023 Active Self, Pharmacy Records  metroNIDAZOLE  (FLAGYL ) 500 MG tablet [id number]  Take 1 tablet (500 mg total) by mouth 2 (two) times daily for 7 days. Verlyn Goad, MD  Active   ondansetron  (ZOFRAN  ODT) 4 MG disintegrating tablet [id number] No Take 1 tablet (4 mg total) by mouth every 8 (eight) hours as needed for nausea.  Patient taking differently: Take 4 mg by mouth as needed for nausea.    Montgomery Apgar, PA-C Unknown Active Self, Pharmacy Records           Med Note (CRUTHIS, CHLOE C   Sat Nov 16, 2023  9:24 AM) Pt is unsure of last dose.   polyethylene glycol powder (MIRALAX) 17 GM/SCOOP powder [id number]  Take 17 g by mouth 2 (two) times daily. Decrease to daily if you develop diarrhea. Verlyn Goad, MD  Active   predniSONE  (DELTASONE ) 10 MG tablet [id number]  Take 4 tablets (40 mg total) by mouth daily with breakfast for 7 days, THEN 3 tablets (30 mg total) daily with breakfast for 7 days, THEN 2 tablets (20 mg total) daily with breakfast for 7 days, THEN 1 tablet (10 mg total) daily with breakfast. Verlyn Goad, MD  Active   Semaglutide ,0.25 or 0.5MG /DOS, 2 MG/3ML SOPN [id number] No Inject 0.5 mg into the skin once a week. Paseda, Folashade R, FNP 11/10/2023 Active Self, Pharmacy Records            Home Care and Equipment/Supplies: Were Home Health Services Ordered?: NA Any new equipment or medical supplies ordered?: NA  Functional Questionnaire: Do you need assistance with bathing/showering or dressing?: No Do you need assistance with meal preparation?: No Do you need assistance with eating?: No Do you have difficulty maintaining continence: No Do you need assistance with getting out of bed/getting out of a chair/moving?: No Do you have difficulty managing or taking your medications?: No  Follow up appointments reviewed: PCP Follow-up appointment confirmed?: Yes Date of PCP follow-up appointment?: 11/25/23 Follow-up Provider: [facility] Follow-up appointment confirmed?: No Reason Specialist Follow-Up Not Confirmed: Patient has Specialist Provider Number and will Call for  Appointment Do you need transportation to your follow-up appointment?: No Do you understand care options if your condition(s) worsen?: Yes-patient verbalized understanding    SIGNATURE Darrall Ellison, LPN [facility] Nurse Health Advisor Direct Dial [phone]

## 2023-11-25 ENCOUNTER — Inpatient Hospital Stay: Payer: Self-pay | Admitting: Nurse Practitioner

## 2023-12-06 ENCOUNTER — Ambulatory Visit: Admitting: Nurse Practitioner

## 2023-12-12 ENCOUNTER — Emergency Department (HOSPITAL_BASED_OUTPATIENT_CLINIC_OR_DEPARTMENT_OTHER)

## 2023-12-12 ENCOUNTER — Emergency Department (HOSPITAL_BASED_OUTPATIENT_CLINIC_OR_DEPARTMENT_OTHER)
Admission: EM | Admit: 2023-12-12 | Discharge: 2023-12-12 | Disposition: A | Discharge status: Home / Self Care | Attending: Emergency Medicine | Admitting: Emergency Medicine

## 2023-12-12 ENCOUNTER — Other Ambulatory Visit: Payer: Self-pay

## 2023-12-12 DIAGNOSIS — K5090 Crohn's disease, unspecified, without complications: Secondary | ICD-10-CM

## 2023-12-12 DIAGNOSIS — Z794 Long term (current) use of insulin: Secondary | ICD-10-CM | POA: Insufficient documentation

## 2023-12-12 DIAGNOSIS — R1031 Right lower quadrant pain: Secondary | ICD-10-CM

## 2023-12-12 DIAGNOSIS — D72829 Elevated white blood cell count, unspecified: Secondary | ICD-10-CM | POA: Insufficient documentation

## 2023-12-12 DIAGNOSIS — N83201 Unspecified ovarian cyst, right side: Secondary | ICD-10-CM | POA: Insufficient documentation

## 2023-12-12 DIAGNOSIS — D649 Anemia, unspecified: Secondary | ICD-10-CM | POA: Diagnosis not present

## 2023-12-12 DIAGNOSIS — B3731 Acute candidiasis of vulva and vagina: Secondary | ICD-10-CM | POA: Insufficient documentation

## 2023-12-12 DIAGNOSIS — B379 Candidiasis, unspecified: Secondary | ICD-10-CM

## 2023-12-12 DIAGNOSIS — N83299 Other ovarian cyst, unspecified side: Secondary | ICD-10-CM

## 2023-12-12 LAB — CBC WITH DIFFERENTIAL/PLATELET
Abs Immature Granulocytes: 0.08 10*3/uL — ABNORMAL HIGH (ref 0.00–0.07)
Basophils Absolute: 0 10*3/uL (ref 0.0–0.1)
Basophils Relative: 0 %
Eosinophils Absolute: 0.1 10*3/uL (ref 0.0–0.5)
Eosinophils Relative: 1 %
HCT: 30.6 % — ABNORMAL LOW (ref 36.0–46.0)
Hemoglobin: 9.3 g/dL — ABNORMAL LOW (ref 12.0–15.0)
Immature Granulocytes: 1 %
Lymphocytes Relative: 20 %
Lymphs Abs: 2.7 10*3/uL (ref 0.7–4.0)
MCH: 23.4 pg — ABNORMAL LOW (ref 26.0–34.0)
MCHC: 30.4 g/dL (ref 30.0–36.0)
MCV: 77.1 fL — ABNORMAL LOW (ref 80.0–100.0)
Monocytes Absolute: 1 10*3/uL (ref 0.1–1.0)
Monocytes Relative: 7 %
Neutro Abs: 9.7 10*3/uL — ABNORMAL HIGH (ref 1.7–7.7)
Neutrophils Relative %: 71 %
Platelets: 447 10*3/uL — ABNORMAL HIGH (ref 150–400)
RBC: 3.97 MIL/uL (ref 3.87–5.11)
RDW: 16 % — ABNORMAL HIGH (ref 11.5–15.5)
WBC: 13.5 10*3/uL — ABNORMAL HIGH (ref 4.0–10.5)
nRBC: 0 % (ref 0.0–0.2)

## 2023-12-12 LAB — COMPREHENSIVE METABOLIC PANEL WITH GFR
ALT: <5 U/L (ref 0–44)
AST: 6 U/L — ABNORMAL LOW (ref 15–41)
Albumin: 3.5 g/dL (ref 3.5–5.0)
Alkaline Phosphatase: 99 U/L (ref 38–126)
Anion gap: 12 (ref 5–15)
BUN: 11 mg/dL (ref 6–20)
CO2: 23 mmol/L (ref 22–32)
Calcium: 8.8 mg/dL — ABNORMAL LOW (ref 8.9–10.3)
Chloride: 102 mmol/L (ref 98–111)
Creatinine, Ser: 0.69 mg/dL (ref 0.44–1.00)
GFR, Estimated: >60 mL/min (ref >60)
Glucose, Bld: 98 mg/dL (ref 70–99)
Potassium: 3.4 mmol/L — ABNORMAL LOW (ref 3.5–5.1)
Sodium: 137 mmol/L (ref 135–145)
Total Bilirubin: <0.2 mg/dL (ref 0.0–1.2)
Total Protein: 6.9 g/dL (ref 6.5–8.1)

## 2023-12-12 LAB — URINALYSIS, ROUTINE W REFLEX MICROSCOPIC
Bacteria, UA: NONE SEEN
Bilirubin Urine: NEGATIVE
Glucose, UA: NEGATIVE mg/dL
Hgb urine dipstick: NEGATIVE
Ketones, ur: NEGATIVE mg/dL
Leukocytes,Ua: NEGATIVE
Nitrite: NEGATIVE
Protein, ur: 30 mg/dL — AB
Specific Gravity, Urine: >1.046 — ABNORMAL HIGH (ref 1.005–1.030)
pH: 7.5 (ref 5.0–8.0)

## 2023-12-12 LAB — LIPASE, BLOOD: Lipase: 20 U/L (ref 11–51)

## 2023-12-12 LAB — PREGNANCY, URINE: Preg Test, Ur: NEGATIVE

## 2023-12-12 LAB — WET PREP, GENITAL
Clue Cells Wet Prep HPF POC: NONE SEEN
Sperm: NONE SEEN
Trich, Wet Prep: NONE SEEN
WBC, Wet Prep HPF POC: <10 (ref <10)
Yeast Wet Prep HPF POC: NONE SEEN

## 2023-12-12 MED ORDER — PREDNISONE 10 MG PO TABS: ORAL_TABLET | ORAL | 0 refills | Status: AC

## 2023-12-12 MED ORDER — OXYCODONE HCL 5 MG PO TABS: 5.0000 mg | ORAL_TABLET | ORAL | 0 refills | Status: DC | PRN

## 2023-12-12 MED ORDER — IOHEXOL 300 MG/ML  SOLN
100.0000 mL | Freq: Once | INTRAMUSCULAR | Status: AC | PRN
  Administered 2023-12-12: 100 mL via INTRAVENOUS

## 2023-12-12 MED ORDER — KETOROLAC TROMETHAMINE 30 MG/ML IJ SOLN
30.0000 mg | Freq: Once | INTRAMUSCULAR | Status: AC
  Administered 2023-12-12: 30 mg via INTRAVENOUS
  Filled 2023-12-12: qty 1

## 2023-12-12 MED ORDER — ONDANSETRON 4 MG PO TBDP: 4.0000 mg | ORAL_TABLET | Freq: Three times a day (TID) | ORAL | 0 refills | Status: AC | PRN

## 2023-12-12 MED ORDER — FLUCONAZOLE 150 MG PO TABS
150.0000 mg | ORAL_TABLET | Freq: Once | ORAL | Status: AC
  Administered 2023-12-12: 150 mg via ORAL
  Filled 2023-12-12: qty 1

## 2023-12-12 MED ORDER — FLUCONAZOLE 200 MG PO TABS: 200.0000 mg | ORAL_TABLET | Freq: Every day | ORAL | 0 refills | Status: DC | PRN

## 2023-12-12 NOTE — ED Triage Notes (Signed)
 Pt arrived POV from [facility] c/o R lower abd pain. PMH Crohn's but pt states this is a different and more severe pain.

## 2023-12-12 NOTE — ED Notes (Signed)
 RN reviewed discharge instructions with pt. Pt verbalized understanding and had no further questions. VSS upon discharge.

## 2023-12-12 NOTE — ED Provider Notes (Signed)
  Physical Exam  BP 110/77   Pulse 87   Temp 99.1 F (37.3 C)   Resp 19   LMP 11/04/2023 (Approximate)   SpO2 98%   Physical Exam  Procedures  Procedures  ED Course / MDM   Clinical Course as of 12/12/23 1627  Thu Dec 12, 2023  1447 Although wet prep does not show clear evidence of yeast infection clinical exam is highly consistent with vaginal candidiasis [MT]    Clinical Course User Index [MT] Trifan, Janalyn Me, MD   Received care of patient from Dr. Gordon Latus.Briefly this is a 28yo female with history of Crohn's disease, treated 1 month ago for ileiitis treated with abx, steroids and improved who presents with RLQ abdominal pain.  CT with continued ileitis, abnormality of right adnexa similar to prior CT>  US  ordered and pending.  US  completed show right ovary replaced by complex cystic structure which may be hemorrhagic cyst, TOA, ovarian neoplasm.  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  Discussed with patient consider re=admission, GYN consult, IV abx, however given low suspicion overall for abscess feel outpatient follow up with OBGYN and GI physician. Will restart prednisone  taper with increased dose. Given rx for pain medications. Discussed strict return precautions. Patient discharged in stable condition with understanding of reasons to return.       Scarlette Currier, MD 12/13/23 2139

## 2023-12-12 NOTE — Discharge Instructions (Addendum)
 You were seen today for right sided abdominal pain-you have inflammation of your ileum again and discussed with GI--will increase your prednisone  again and taper and recommend close GI follow up.  You have a complex cyst on the right ovary which could be a hemorrhagic cyst, abscess or neoplasm.  Given no fever at this time, feel it is reasonable to continue to monitor your symptoms, however if you develop other concerning symptoms for infection recommend you return and can reconsider IV antibiotics//possible surgery.  Recommend you follow up closely with your GYN regarding this and recommend repeat US  in 6 weeks.

## 2023-12-12 NOTE — ED Provider Notes (Signed)
 [place] EMERGENCY DEPARTMENT AT [facility] Provider Note   CSN: [id number] Arrival date & time: 12/12/23  1012     History  Chief Complaint  Patient presents with   Abdominal Pain    Connie Williamson is a 28 y.o. female presented emerged department complaining of lower abdominal pain.  Patient reports onset of her symptoms approximately 3 days ago.  It is a sharp persistent abdominal pain also with nausea.  She has been having difficulty passing gas for the past 24 hours but did have a bowel movement yesterday.  She says this feels different than her Crohn's disease or bowel disease in the past.  She was hospitalized 1 month ago for ileitis, treated with IV steroids and antibiotics, says her symptoms completely resolved afterwards.  She denies vaginal discharge or bleeding.  She has a known left adnexal cyst.  She denies any concern for pregnancy, reporting that she uses birth control, and also is in a same-sex relationship  HPI     Home Medications Prior to Admission medications   Medication Sig Start Date End Date Taking? Authorizing Provider  fluconazole (DIFLUCAN) 200 MG tablet Take 1 tablet (200 mg total) by mouth daily as needed for up to 1 dose. 12/12/23  Yes Kanisha Duba, Janalyn Me, MD  Continuous Glucose Sensor (DEXCOM G7 SENSOR) MISC Please use as directed. 01/23/23   Paseda, Folashade R, FNP  insulin  degludec (TRESIBA  FLEXTOUCH) 100 UNIT/ML FlexTouch Pen Inject 40 Units into the skin at bedtime. 10/11/23   Paseda, Folashade R, FNP  ondansetron  (ZOFRAN  ODT) 4 MG disintegrating tablet Take 1 tablet (4 mg total) by mouth every 8 (eight) hours as needed for nausea. Patient taking differently: Take 4 mg by mouth as needed for nausea. 01/25/15   Piepenbrink, Bridgette Campus, PA-C  polyethylene glycol powder (MIRALAX ) 17 GM/SCOOP powder Take 17 g by mouth 2 (two) times daily. Decrease to daily if you develop diarrhea. 11/17/23   Verlyn Goad, MD  predniSONE  (DELTASONE ) 10 MG tablet  Take 4 tablets (40 mg total) by mouth daily with breakfast for 7 days, THEN 3 tablets (30 mg total) daily with breakfast for 7 days, THEN 2 tablets (20 mg total) daily with breakfast for 7 days, THEN 1 tablet (10 mg total) daily with breakfast. 11/18/23 01/08/24  Verlyn Goad, MD  Semaglutide ,0.25 or 0.5MG /DOS, 2 MG/3ML SOPN Inject 0.5 mg into the skin once a week. 10/08/23   Paseda, Folashade R, FNP      Allergies    Patient has no known allergies.    Review of Systems   Review of Systems  Physical Exam Updated Vital Signs BP 110/77   Pulse 87   Temp 99.1 F (37.3 C)   Resp 19   LMP 11/04/2023 (Approximate)   SpO2 98%  Physical Exam Constitutional:      General: She is not in acute distress. HENT:     Head: Normocephalic and atraumatic.  Eyes:     Conjunctiva/sclera: Conjunctivae normal.     Pupils: Pupils are equal, round, and reactive to light.  Cardiovascular:     Rate and Rhythm: Regular rhythm. Tachycardia present.  Pulmonary:     Effort: Pulmonary effort is normal. No respiratory distress.  Abdominal:     General: There is no distension.     Tenderness: There is abdominal tenderness in the right lower quadrant. Positive signs include McBurney's sign. Negative signs include Murphy's sign.  Genitourinary:    Comments: Pelvic exam performed with nurse chaperone present  Patient has white discharge in vaginal vault, cottage cheese appearance; no thick green or malodorous discharge, no redness of the cervical os, no significant adnexal tenderness Skin:    General: Skin is warm and dry.  Neurological:     General: No focal deficit present.     Mental Status: She is alert. Mental status is at baseline.  Psychiatric:        Mood and Affect: Mood normal.        Behavior: Behavior normal.     ED Results / Procedures / Treatments   Labs (all labs ordered are listed, but only abnormal results are displayed) Labs Reviewed  COMPREHENSIVE METABOLIC PANEL WITH GFR - Abnormal;  Notable for the following components:      Result Value   Potassium 3.4 (*)    Calcium 8.8 (*)    AST 6 (*)    All other components within normal limits  URINALYSIS, ROUTINE W REFLEX MICROSCOPIC - Abnormal; Notable for the following components:   Specific Gravity, Urine >1.046 (*)    Protein, ur 30 (*)    All other components within normal limits  CBC WITH DIFFERENTIAL/PLATELET - Abnormal; Notable for the following components:   WBC 13.5 (*)    Hemoglobin 9.3 (*)    HCT 30.6 (*)    MCV 77.1 (*)    MCH 23.4 (*)    RDW 16.0 (*)    Platelets 447 (*)    Neutro Abs 9.7 (*)    Abs Immature Granulocytes 0.08 (*)    All other components within normal limits  WET PREP, GENITAL  LIPASE, BLOOD  PREGNANCY, URINE  GC/CHLAMYDIA PROBE AMP ([place]) NOT AT [facility]    EKG None  Radiology CT ABDOMEN PELVIS W CONTRAST Result Date: 12/12/2023 CLINICAL DATA:  RLQ abdominal pain - eval for appendicitis - recent treatment for ileitis. EXAM: CT ABDOMEN AND PELVIS WITH CONTRAST TECHNIQUE: Multidetector CT imaging of the abdomen and pelvis was performed using the standard protocol following bolus administration of intravenous contrast. RADIATION DOSE REDUCTION: This exam was performed according to the departmental dose-optimization program which includes automated exposure control, adjustment of the mA and/or kV according to patient size and/or use of iterative reconstruction technique. CONTRAST:  OMNIPAQUE  IOHEXOL  300 MG/ML  SOLN COMPARISON:  CT scan abdomen and pelvis from 11/15/2023. FINDINGS: Lower chest: The lung bases are clear. No pleural effusion. The heart is normal in size. No pericardial effusion. Hepatobiliary: The liver is normal in size. Non-cirrhotic configuration. No suspicious mass. No intrahepatic or extrahepatic bile duct dilation. No calcified gallstones. Normal gallbladder wall thickness. No pericholecystic inflammatory changes. Pancreas: Unremarkable. No pancreatic ductal dilatation  or surrounding inflammatory changes. Spleen: Within normal limits. No focal lesion. Adrenals/Urinary Tract: Adrenal glands are unremarkable. No suspicious renal mass. No hydronephrosis. No renal or ureteric calculi. Urinary bladder is under distended, precluding optimal assessment. However, no large mass or stones identified. No perivesical fat stranding. Stomach/Bowel: No disproportionate dilation of the small or large bowel loops. Patient is status post partial right hemicolectomy with ileocolonic anastomosis in the right lower quadrant. Appendix is surgically absent. Redemonstration of an approximately 10-12 cm long segment of neoterminal ileum exhibiting mild-to-moderate circumferential wall thickening, mucosal hyperattenuation and surrounding fat stranding, favoring active inflammatory bowel disease of neo terminal ileum, highly concerning for Crohn's disease. There is surrounding fat stranding and trace amount of fluid. No proximal bowel dilation. No other affected bowel loop segments seen. Vascular/Lymphatic: There is trace amount of fluid surrounding the neo  terminal ileum, likely reactive, as discussed above. No walled-off abscess or loculated collection. No pneumoperitoneum. There are several prominent right lower quadrant mesenteric lymph nodes, favored benign/reactive. No aneurysmal dilation of the major abdominal arteries. Reproductive: Reproductive organs are not well evaluated on the CT scan exam. However, having said that a normal-sized anteverted uterus is noted. There is in slightly hyperattenuating walled right ovary measuring 3.6 x 4.1 cm, which appears grossly similar to the prior study and underlying secondary inflammation of the ovary is not excluded considering the proximity to the inflamed neo terminal ileum. No left adnexal mass seen. Other: Midline surgical scar noted. The soft tissues and abdominal wall are otherwise unremarkable. Musculoskeletal: No suspicious osseous lesions.  IMPRESSION: 1. Redemonstration of approximately 10-12 cm long segment of neoterminal ileum exhibiting mild-to-moderate circumferential wall thickening, mucosal hyperattenuation and surrounding fat stranding, favoring active inflammatory bowel disease, highly concerning for Crohn's disease. There is surrounding trace amount of fluid. No walled-off abscess or loculated collection. No pneumoperitoneum. 2. There is slightly hyperattenuating walled right ovary measuring 3.6 x 4.1 cm, which appears grossly similar to the prior study and underlying secondary inflammation of the ovary is not excluded considering the proximity to the inflamed neo terminal ileum. 3. Multiple other nonacute observations, as described above. Electronically Signed   By: Beula Brunswick M.D.   On: 12/12/2023 13:40    Procedures Procedures    Medications Ordered in ED Medications  iohexol  (OMNIPAQUE ) 300 MG/ML solution 100 mL (100 mLs Intravenous Contrast Given 12/12/23 1317)  ketorolac  (TORADOL ) 30 MG/ML injection 30 mg (30 mg Intravenous Given 12/12/23 1405)  fluconazole (DIFLUCAN) tablet 150 mg (150 mg Oral Given 12/12/23 1530)    ED Course/ Medical Decision Making/ A&P Clinical Course as of 12/12/23 1550  Thu Dec 12, 2023  1447 Although wet prep does not show clear evidence of yeast infection clinical exam is highly consistent with vaginal candidiasis [MT]    Clinical Course User Index [MT] Kashari Chalmers, Janalyn Me, MD                                 Medical Decision Making Amount and/or Complexity of Data Reviewed Labs: ordered. Radiology: ordered.  Risk Prescription drug management.   This patient presents to the ED with concern for abdominal pain, nausea. This involves an extensive number of treatment options, and is a complaint that carries with it a high risk of complications and morbidity.  The differential diagnosis includes appendicitis versus ureteral stone versus ovarian cyst versus recurring ileitis or bowel  disease versus bowel obstruction versus other  Co-morbidities that complicate the patient evaluation: History of ileitis, abdominal surgery, at risk of adhesions, bowel obstruction and recurrent infection.  History of Crohn's  External records from outside source obtained and reviewed including hospital discharge summary from 1 month ago  I ordered and personally interpreted labs.  The pertinent results include: White blood cell count 13.5, chronically mildly elevated.  Chronic anemia hemoglobin 9.3.  Remaining labs largely unremarkable -wet prep without clear evidence of yeast infection but clinically it does appear she has a yeast infection (if she has been on long-term steroids and antibiotics and is having itching).  She does not have any immediate risk factors for STI exposure, reports she has been in a monogamous same-sex relationship for the past 4 years.  However she is amenable to testing for GC chlamydia, although we will defer empiric treatment until results return.  I do not see clear evidence of PID on pelvic exam  I ordered imaging studies including CT abdomen pelvis with contrast I independently visualized and interpreted imaging which showed continued ileitis findings as well as reported inflammatory findings near the right ovary; no evident abscess I agree with the radiologist interpretation  I ordered medication including IV Toradol  for abdominal pain, Diflucan for yeast infection  I have reviewed the patients home medicines and have made adjustments as needed  Disposition:  The patient is signed out to Dr Tamela Fake EDP pending follow up on pelvic ultrasound imaging.  If there are no emergent findings on pelvic ultrasound, patient may benefit from GI consultation given persistent inflammatory bowel findings.         Final Clinical Impression(s) / ED Diagnoses Final diagnoses:  Yeast infection  Right lower quadrant abdominal pain  Right ovarian cyst    Rx /  DC Orders ED Discharge Orders          Ordered    fluconazole (DIFLUCAN) 200 MG tablet  Daily PRN        12/12/23 1550              Arvilla Birmingham, MD 12/12/23 1550

## 2023-12-13 LAB — GC/CHLAMYDIA PROBE AMP (~~LOC~~) NOT AT ARMC
Chlamydia: NEGATIVE
Comment: NEGATIVE
Comment: NORMAL
Neisseria Gonorrhea: NEGATIVE

## 2023-12-16 ENCOUNTER — Telehealth: Payer: Self-pay

## 2023-12-16 NOTE — Transitions of Care (Post Inpatient/ED Visit) (Signed)
   12/16/2023  Name: Connie Williamson MRN: [id number] DOB: February 17, 1996  Today's TOC FU Call Status: Today's TOC FU Call Status:: Unsuccessful Call (1st Attempt) Unsuccessful Call (1st Attempt) Date: 12/16/23  Attempted to reach the patient regarding the most recent Inpatient/ED visit.  Follow Up Plan: Additional outreach attempts will be made to reach the patient to complete the Transitions of Care (Post Inpatient/ED visit) call.   Signature  [employer], [place]

## 2023-12-17 ENCOUNTER — Telehealth: Payer: Self-pay

## 2023-12-17 NOTE — Telephone Encounter (Signed)
 Copied from CRM [id number]. Topic: General - Other >> Dec 17, 2023 12:21 PM [name] wrote: Reason for CRM: Patient called.. returned your call.. She said she is working til 9pm.. Please send a message to her mychart per her request  TOC call . She will be called again tomorrow. KH

## 2023-12-17 NOTE — Transitions of Care (Post Inpatient/ED Visit) (Signed)
   12/17/2023  Name: Connie Williamson MRN: [id number] DOB: 1996/03/21  Today's TOC FU Call Status: Today's TOC FU Call Status:: Unsuccessful Call (2nd Attempt) Unsuccessful Call (2nd Attempt) Date: 12/17/23  Attempted to reach the patient regarding the most recent Inpatient/ED visit.  Follow Up Plan: Additional outreach attempts will be made to reach the patient to complete the Transitions of Care (Post Inpatient/ED visit) call.   Signature  [employer], [place]

## 2023-12-19 ENCOUNTER — Telehealth: Payer: Self-pay

## 2023-12-19 NOTE — Transitions of Care (Post Inpatient/ED Visit) (Signed)
   12/19/2023  Name: Connie Williamson MRN: [id number] DOB: [birth year]  Today's TOC FU Call Status: Today's TOC FU Call Status:: Unsuccessful Call (3rd Attempt) Unsuccessful Call (3rd Attempt) Date: 12/19/23  Attempted to reach the patient regarding the most recent Inpatient/ED visit.  Follow Up Plan: No further outreach attempts will be made at this time. We have been unable to contact the patient.  Signature  [employer], [place]

## 2023-12-20 ENCOUNTER — Inpatient Hospital Stay: Admitting: Nurse Practitioner

## 2023-12-25 ENCOUNTER — Ambulatory Visit: Admitting: Nurse Practitioner

## 2023-12-25 ENCOUNTER — Encounter: Payer: Self-pay | Admitting: Nurse Practitioner

## 2023-12-25 VITALS — BP 102/67 | HR 96 | Temp 97.2°F | Ht 63.0 in | Wt 194.0 lb

## 2023-12-25 DIAGNOSIS — K50919 Crohn's disease, unspecified, with unspecified complications: Secondary | ICD-10-CM

## 2023-12-25 DIAGNOSIS — E1165 Type 2 diabetes mellitus with hyperglycemia: Secondary | ICD-10-CM | POA: Diagnosis not present

## 2023-12-25 DIAGNOSIS — Z09 Encounter for follow-up examination after completed treatment for conditions other than malignant neoplasm: Secondary | ICD-10-CM | POA: Insufficient documentation

## 2023-12-25 DIAGNOSIS — Z794 Long term (current) use of insulin: Secondary | ICD-10-CM

## 2023-12-25 DIAGNOSIS — F419 Anxiety disorder, unspecified: Secondary | ICD-10-CM

## 2023-12-25 DIAGNOSIS — D509 Iron deficiency anemia, unspecified: Secondary | ICD-10-CM

## 2023-12-25 DIAGNOSIS — N949 Unspecified condition associated with female genital organs and menstrual cycle: Secondary | ICD-10-CM | POA: Insufficient documentation

## 2023-12-25 DIAGNOSIS — E669 Obesity, unspecified: Secondary | ICD-10-CM

## 2023-12-25 DIAGNOSIS — B379 Candidiasis, unspecified: Secondary | ICD-10-CM | POA: Diagnosis not present

## 2023-12-25 DIAGNOSIS — F32A Depression, unspecified: Secondary | ICD-10-CM | POA: Insufficient documentation

## 2023-12-25 DIAGNOSIS — K5900 Constipation, unspecified: Secondary | ICD-10-CM | POA: Insufficient documentation

## 2023-12-25 MED ORDER — SEMAGLUTIDE (1 MG/DOSE) 4 MG/3ML ~~LOC~~ SOPN: 1.0000 mg | PEN_INJECTOR | SUBCUTANEOUS | 0 refills | Status: DC

## 2023-12-25 MED ORDER — METFORMIN HCL ER 500 MG PO TB24: ORAL_TABLET | ORAL | 1 refills | Status: DC

## 2023-12-25 MED ORDER — SEMAGLUTIDE (1 MG/DOSE) 4 MG/3ML ~~LOC~~ SOPN
1.0000 mg | PEN_INJECTOR | SUBCUTANEOUS | 0 refills | Status: DC
Start: 2024-01-22 — End: 2024-01-06

## 2023-12-25 MED ORDER — FLUOXETINE HCL 20 MG PO TABS: 20.0000 mg | ORAL_TABLET | Freq: Every day | ORAL | 0 refills | Status: DC

## 2023-12-25 MED ORDER — TRESIBA FLEXTOUCH 100 UNIT/ML ~~LOC~~ SOPN: 44.0000 [IU] | PEN_INJECTOR | Freq: Every evening | SUBCUTANEOUS | 3 refills | Status: AC

## 2023-12-25 MED ORDER — SEMAGLUTIDE(0.25 OR 0.5MG/DOS) 2 MG/3ML ~~LOC~~ SOPN: 0.5000 mg | PEN_INJECTOR | SUBCUTANEOUS | 0 refills | Status: DC

## 2023-12-25 NOTE — Assessment & Plan Note (Addendum)
 Wt Readings from Last 3 Encounters:  12/25/23 194 lb (88 kg)  11/16/23 198 lb 10.2 oz (90.1 kg)  01/23/23 191 lb 3.2 oz (86.7 kg)   Body mass index is 34.37 kg/m.  Patient counseled on low-carb diet Encouraged to engage in regular moderate exercise at least 150 minutes weekly as tolerated

## 2023-12-25 NOTE — Assessment & Plan Note (Signed)
 Encouraged to maintain close follow-up with GI Continue tapered prednisone 

## 2023-12-25 NOTE — Assessment & Plan Note (Addendum)
 Lab Results  Component Value Date   HGBA1C 10.9 (H) 11/17/2023  Start Tresiba  44 units daily Willing to restart Ozempic  and try metformin  XR Restart Ozempic  0.5 mg once weekly after 4 weeks start Ozempic  1 mg once weekly Start metformin  XR 500 mg daily for 1 week if well-tolerated increase to 500 mg twice daily after 1 week Patient referred for diabetic nutrition education Referral sent to the clinical pharmacist for assistance with management of type 2 diabetes Up-to-date with diabetic eye exam, checking urine microalbumin labs Not on a statin checking lipid panel Follow-up in 2 months

## 2023-12-25 NOTE — Assessment & Plan Note (Signed)
 Continue MiraLAX  as needed Encouraged to increase intake of fiber and maintain hydration Importance of exercising at least 150 minutes weekly discussed

## 2023-12-25 NOTE — Assessment & Plan Note (Addendum)
 Flowsheet Row Office Visit from 12/25/2023 in [facility] Dept Of [facility]  PHQ-9 Total Score 4       12/25/2023    9:31 AM  GAD 7 : Generalized Anxiety Score  Nervous, Anxious, on Edge 3  Control/stop worrying 1  Worry too much - different things 3  Trouble relaxing 2  Restless 0  Easily annoyed or irritable 1  Afraid - awful might happen 1  Total GAD 7 Score 11  Anxiety Difficulty Very difficult   Start Prozac 20 mg daily Patient referred for counseling

## 2023-12-25 NOTE — Assessment & Plan Note (Signed)
 Checking CBC and iron panel She was on iron infusion in the past

## 2023-12-25 NOTE — Patient Instructions (Addendum)
 Goal for fasting blood sugar ranges from 80 to 120 and 2 hours after any meal or at bedtime should be between 130 to 170.   Please start Tresiba  44 units daily Restart Ozempic  at 0.5 mg once weekly take for 4 weeks after 4 weeks start Ozempic  1 mg once weekly  Start taking Prozac 20 mg daily for anxiety and depression.  I have referred you for counseling      It is important that you exercise regularly at least 30 minutes 5 times a week as tolerated  Think about what you will eat, plan ahead. Choose  clean, green, fresh or frozen over canned, processed or packaged foods which are more sugary, salty and fatty. 70 to 75% of food eaten should be vegetables and fruit. Three meals at set times with snacks allowed between meals, but they must be fruit or vegetables. Aim to eat over a 12 hour period , example 7 am to 7 pm, and STOP after  your last meal of the day. Drink water,generally about 64 ounces per day, no other drink is as healthy. Fruit juice is best enjoyed in a healthy way, by EATING the fruit.  [facility] for choosing [facility] we consider it a privelige to serve you.     It is important that you exercise regularly at least 30 minutes 5 times a week as tolerated  Think about what you will eat, plan ahead. Choose  clean, green, fresh or frozen over canned, processed or packaged foods which are more sugary, salty and fatty. 70 to 75% of food eaten should be vegetables and fruit. Three meals at set times with snacks allowed between meals, but they must be fruit or vegetables. Aim to eat over a 12 hour period , example 7 am to 7 pm, and STOP after  your last meal of the day. Drink water,generally about 64 ounces per day, no other drink is as healthy. Fruit juice is best enjoyed in a healthy way, by EATING the fruit.  Thanks for choosing [facility] we consider it a privelige to serve you.

## 2023-12-25 NOTE — Assessment & Plan Note (Signed)
 Hospital chart reviewed, including discharge summary Medications reconciled and reviewed with the patient in detail

## 2023-12-25 NOTE — Assessment & Plan Note (Signed)
 Recently prescribed fluconazole  200 mg one-time dose at emergency department She denies any complaints today

## 2023-12-25 NOTE — Assessment & Plan Note (Signed)
 Noted on recent pelvic ultrasound Patient encouraged to follow-up with gynecologist  See results and recommendations below 1. No evidence of ovarian torsion. 2. The right ovary is nearly entirely replaced by a complex cystic structure with thick internal septations, which may represent hemorrhagic cyst, tubo-ovarian abscess, or epithelial ovarian neoplasm. Recommend follow-up pelvic ultrasound examination in 6 weeks to ensure resolution. Alternatively, pelvic MRI.

## 2023-12-25 NOTE — Progress Notes (Signed)
 Established Patient Office Visit  Subjective:  Patient ID: Connie Williamson, female    DOB: Sep 25, 1995  Age: 28 y.o. MRN: [id number]  CC:  Chief Complaint  Patient presents with   Anxiety    HPI Connie Williamson is a 28 y.o. female  has a past medical history of Asthma, Crohn's disease (HCC), Diabetes mellitus, Intramural leiomyoma of uterus, and Menorrhagia.  Patient presents for follow-up for her chronic medical conditions.  Patient stated that she has not been following up regularly due to lack of transportation, stated that this problem has been fixed and no more has problem with transportation.  Crohn's disease.  She was on admission at the hospital in May was treated with antibiotics and steroids, she has followed up with GI at [facility] after her hospitalization, they had recommended continuing tapered prednisone .  Has upcoming colonoscopy.  Still has some pain on the right side of her abdomen, moving her bowels okay with MiraLAX , no blood in her stool  Uncontrolled type 2 diabetes.  Currently on Tresiba  40 units daily, she stopped taking Ozempic  about 3 weeks ago due to constipation, states that MiraLAX  helps her constipation.  No nausea, vomiting with Ozempic , has  right sided abdominal pain which could be related to her Crohn's disease and adnexal cyst.  She has stopped taking metformin  due to stomach upset ,her blood sugar at home has been in the 250s, stating her diet can be better eating rice, oatmeal sweets juices and soda sometimes.  Stated that she is up-to-date with diabetic eye exam, she has started doing some walking exercises. she denies polyuria polyphagia polydipsia   Anxiety and depression.  Stated that she has always had anxiety and depression and feels overwhelmed due to her chronic medical conditions.  She would like to start  taking medication and doing counseling .currently denies SI, HI.   She has been missing work due to her chronic medical conditions,  stated that she is allowed to work 5 days from home, wants to know if she can be allowed to have more days to work from home whenever she is sick.  I will take a look at the forms from her work and provide recommendations accordingly.      Past Medical History:  Diagnosis Date   Asthma    Crohn's disease (HCC)    Diabetes mellitus    Intramural leiomyoma of uterus    Menorrhagia     Past Surgical History:  Procedure Laterality Date   ADENOIDECTOMY     BOWEL RESECTION     TONSILLECTOMY      Family History  Problem Relation Age of Onset   Diabetes Mother    Kidney failure Mother    Hypertension Mother    Healthy Father    Healthy Sister    Healthy Brother    Diabetes Maternal Grandmother    Hypertension Maternal Grandmother    Diabetes Maternal Grandfather    Hypertension Maternal Grandfather     Social History   Socioeconomic History   Marital status: Significant Other    Spouse name: Not on file   Number of children: Not on file   Years of education: Not on file   Highest education level: Associate degree: academic program  Occupational History   Not on file  Tobacco Use   Smoking status: Never   Smokeless tobacco: Never  Substance and Sexual Activity   Alcohol use: Not Currently    Comment: Socially   Drug use: No  Sexual activity: Yes    Birth control/protection: Pill  Other Topics Concern   Not on file  Social History Narrative   Lives with a room mate.   Social Drivers of Corporate investment banker Strain: Low Risk  (12/16/2023)   Received from [facility]   Overall Financial Resource Strain (CARDIA)    Difficulty of Paying Living Expenses: Not very hard  Food Insecurity: No Food Insecurity (12/16/2023)   [facility] from [facility]   Hunger Vital Sign    Within the past 12 months, you worried that your food would run out before you got the money to buy more.: Never true    Within the past 12 months, the food you bought just didn't last and  you didn't have money to get more.: Never true  Transportation Needs: Unmet Transportation Needs (12/16/2023)   Received from [facility]   PRAPARE - Transportation    Lack of Transportation (Medical): Yes    Lack of Transportation (Non-Medical): Yes  Physical Activity: Sufficiently Active (12/16/2023)   Received from [facility]   Exercise Vital Sign    On average, how many days per week do you engage in moderate to strenuous exercise (like a brisk walk)?: 5 days    On average, how many minutes do you engage in exercise at this level?: 30 min  Recent Concern: Physical Activity - Insufficiently Active (12/05/2023)   Exercise Vital Sign    Days of Exercise per Week: 1 day    Minutes of Exercise per Session: 10 min  Stress: Stress Concern Present (12/16/2023)   Received from [facility] - Occupational Stress Questionnaire    Feeling of Stress : To some extent  Social Connections: Socially Integrated (12/16/2023)   Received from [facility]   Social Network    How would you rate your social network (family, work, friends)?: Good participation with social networks  Intimate Partner Violence: Not At Risk (12/16/2023)   Received from [facility]   HITS    Over the last 12 months how often did your partner physically hurt you?: Never    Over the last 12 months how often did your partner insult you or talk down to you?: Never    Over the last 12 months how often did your partner threaten you with physical harm?: Never    Over the last 12 months how often did your partner scream or curse at you?: Never    Outpatient Medications Prior to Visit  Medication Sig Dispense Refill   Continuous Glucose Sensor (DEXCOM G7 SENSOR) MISC Please use as directed. 9 each 3   ondansetron  (ZOFRAN -ODT) 4 MG disintegrating tablet Take 1 tablet (4 mg total) by mouth every 8 (eight) hours as needed for nausea or vomiting. 20 tablet 0   oxyCODONE  (ROXICODONE ) 5 MG  immediate release tablet Take 1 tablet (5 mg total) by mouth every 4 (four) hours as needed for severe pain (pain score 7-10). 15 tablet 0   polyethylene glycol powder (MIRALAX ) 17 GM/SCOOP powder Take 17 g by mouth 2 (two) times daily. Decrease to daily if you develop diarrhea. 510 g 0   predniSONE  (DELTASONE ) 10 MG tablet Take 40mg  (4 tablets) a day for 1 week, 30mg  a day (3 tablets) for the second week, 20mg  (2 tablets) a day for the third week, 10mg  (1 tablet) a day for the fourth week. 70 tablet 0   insulin  degludec (TRESIBA  FLEXTOUCH) 100 UNIT/ML FlexTouch Pen  Inject 40 Units into the skin at bedtime. 15 mL 3   fluconazole  (DIFLUCAN ) 200 MG tablet Take 1 tablet (200 mg total) by mouth daily as needed for up to 1 dose. (Patient not taking: Reported on 12/25/2023) 1 tablet 0   Semaglutide ,0.25 or 0.5MG /DOS, 2 MG/3ML SOPN Inject 0.5 mg into the skin once a week. (Patient not taking: Reported on 12/25/2023) 3 mL 0   No facility-administered medications prior to visit.    No Known Allergies  ROS Review of Systems  Constitutional:  Negative for appetite change, chills, fatigue and fever.  HENT:  Negative for congestion, postnasal drip, rhinorrhea and sneezing.   Respiratory:  Negative for cough, shortness of breath and wheezing.   Cardiovascular:  Negative for chest pain, palpitations and leg swelling.  Gastrointestinal:  Positive for abdominal pain and constipation. Negative for anal bleeding, blood in stool, nausea and vomiting.  Genitourinary:  Negative for difficulty urinating, dysuria, flank pain and frequency.  Musculoskeletal:  Negative for arthralgias, back pain, joint swelling and myalgias.  Skin:  Negative for color change, pallor, rash and wound.  Neurological:  Negative for dizziness, facial asymmetry, weakness, numbness and headaches.  Psychiatric/Behavioral:  Negative for behavioral problems, confusion, self-injury and suicidal ideas.       Objective:    Physical Exam Vitals  and nursing note reviewed.  Constitutional:      General: She is not in acute distress.    Appearance: Normal appearance. She is obese. She is not ill-appearing, toxic-appearing or diaphoretic.   Eyes:     General: No scleral icterus.       Right eye: No discharge.        Left eye: No discharge.     Extraocular Movements: Extraocular movements intact.     Conjunctiva/sclera: Conjunctivae normal.    Cardiovascular:     Rate and Rhythm: Normal rate and regular rhythm.     Pulses: Normal pulses.     Heart sounds: Normal heart sounds. No murmur heard.    No friction rub. No gallop.  Pulmonary:     Effort: Pulmonary effort is normal. No respiratory distress.     Breath sounds: Normal breath sounds. No stridor. No wheezing, rhonchi or rales.  Chest:     Chest wall: No tenderness.  Abdominal:     General: There is no distension.     Palpations: Abdomen is soft.     Tenderness: There is abdominal tenderness. There is no right CVA tenderness, left CVA tenderness or guarding.     Comments: RLQ   Musculoskeletal:        General: No swelling, tenderness, deformity or signs of injury.     Right lower leg: No edema.     Left lower leg: No edema.   Skin:    General: Skin is warm and dry.     Capillary Refill: Capillary refill takes less than 2 seconds.     Coloration: Skin is not jaundiced or pale.     Findings: No bruising, erythema or lesion.   Neurological:     Mental Status: She is alert and oriented to person, place, and time.     Motor: No weakness.     Coordination: Coordination normal.     Gait: Gait normal.   Psychiatric:        Mood and Affect: Mood normal.        Behavior: Behavior normal.        Thought Content: Thought content normal.  Judgment: Judgment normal.     BP 102/67   Pulse 96   Temp (!) 97.2 F (36.2 C)   Ht 5' 3 (1.6 m)   Wt 194 lb (88 kg)   SpO2 100%   BMI 34.37 kg/m  Wt Readings from Last 3 Encounters:  12/25/23 194 lb (88 kg)   11/16/23 198 lb 10.2 oz (90.1 kg)  01/23/23 191 lb 3.2 oz (86.7 kg)    Lab Results  Component Value Date   TSH 1.950 12/02/2019   Lab Results  Component Value Date   WBC 13.5 (H) 12/12/2023   HGB 9.3 (L) 12/12/2023   HCT 30.6 (L) 12/12/2023   MCV 77.1 (L) 12/12/2023   PLT 447 (H) 12/12/2023   Lab Results  Component Value Date   NA 137 12/12/2023   K 3.4 (L) 12/12/2023   CO2 23 12/12/2023   GLUCOSE 98 12/12/2023   BUN 11 12/12/2023   CREATININE 0.69 12/12/2023   BILITOT <0.2 12/12/2023   ALKPHOS 99 12/12/2023   AST 6 (L) 12/12/2023   ALT <5 12/12/2023   PROT 6.9 12/12/2023   ALBUMIN 3.5 12/12/2023   CALCIUM  8.8 (L) 12/12/2023   ANIONGAP 12 12/12/2023   EGFR 123 11/24/2020   Lab Results  Component Value Date   CHOL 209 (H) 11/24/2020   Lab Results  Component Value Date   HDL 57 11/24/2020   Lab Results  Component Value Date   LDLCALC 131 (H) 11/24/2020   Lab Results  Component Value Date   TRIG 121 11/24/2020   Lab Results  Component Value Date   CHOLHDL 3.7 11/24/2020   Lab Results  Component Value Date   HGBA1C 10.9 (H) 11/17/2023      Assessment & Plan:   Problem List Items Addressed This Visit       Endocrine   Uncontrolled type 2 diabetes mellitus with hyperglycemia, with long-term current use of insulin  (HCC)   Lab Results  Component Value Date   HGBA1C 10.9 (H) 11/17/2023  Start Tresiba  44 units daily Willing to restart Ozempic  and try metformin  XR Restart Ozempic  0.5 mg once weekly after 4 weeks start Ozempic  1 mg once weekly Start metformin  XR 500 mg daily for 1 week if well-tolerated increase to 500 mg twice daily after 1 week Patient referred for diabetic nutrition education Referral sent to the clinical pharmacist for assistance with management of type 2 diabetes Up-to-date with diabetic eye exam, checking urine microalbumin labs Not on a statin checking lipid panel Follow-up in 2 months      Relevant Medications    Semaglutide ,0.25 or 0.5MG /DOS, 2 MG/3ML SOPN   insulin  degludec (TRESIBA  FLEXTOUCH) 100 UNIT/ML FlexTouch Pen   Semaglutide , 1 MG/DOSE, 4 MG/3ML SOPN (Start on 01/22/2024)   metFORMIN  (GLUCOPHAGE -XR) 500 MG 24 hr tablet   Other Relevant Orders   Microalbumin/Creatinine Ratio, Urine   Lipid panel   Basic Metabolic Panel   Ambulatory referral to diabetic education   AMB Referral VBCI Care Management     Genitourinary   Adnexal cyst   Noted on recent pelvic ultrasound Patient encouraged to follow-up with gynecologist  See results and recommendations below 1. No evidence of ovarian torsion. 2. The right ovary is nearly entirely replaced by a complex cystic structure with thick internal septations, which may represent hemorrhagic cyst, tubo-ovarian abscess, or epithelial ovarian neoplasm. Recommend follow-up pelvic ultrasound examination in 6 weeks to ensure resolution. Alternatively, pelvic MRI.        Other  Obesity (BMI 30-39.9)   Wt Readings from Last 3 Encounters:  12/25/23 194 lb (88 kg)  11/16/23 198 lb 10.2 oz (90.1 kg)  01/23/23 191 lb 3.2 oz (86.7 kg)   Body mass index is 34.37 kg/m.  Patient counseled on low-carb diet Encouraged to engage in regular moderate exercise at least 150 minutes weekly as tolerated      Relevant Medications   Semaglutide ,0.25 or 0.5MG /DOS, 2 MG/3ML SOPN   insulin  degludec (TRESIBA  FLEXTOUCH) 100 UNIT/ML FlexTouch Pen   Semaglutide , 1 MG/DOSE, 4 MG/3ML SOPN (Start on 01/22/2024)   metFORMIN  (GLUCOPHAGE -XR) 500 MG 24 hr tablet   Crohn's disease (HCC)   Encouraged to maintain close follow-up with GI Continue tapered prednisone       Iron deficiency anemia   Checking CBC and iron panel She was on iron infusion in the past      Relevant Orders   CBC   Iron, TIBC and Ferritin Panel   Anxiety and depression   Flowsheet Row Office Visit from 12/25/2023 in [facility]  PHQ-9 Total  Score 4       12/25/2023    9:31 AM  GAD 7 : Generalized Anxiety Score  Nervous, Anxious, on Edge 3  Control/stop worrying 1  Worry too much - different things 3  Trouble relaxing 2  Restless 0  Easily annoyed or irritable 1  Afraid - awful might happen 1  Total GAD 7 Score 11  Anxiety Difficulty Very difficult   Start Prozac 20 mg daily Patient referred for counseling       Relevant Medications   FLUoxetine (PROZAC) 20 MG tablet   Other Relevant Orders   AMB Referral VBCI Care Management   TSH   Constipation   Continue MiraLAX  as needed Encouraged to increase intake of fiber and maintain hydration Importance of exercising at least 150 minutes weekly discussed      Yeast infection - Primary   Recently prescribed fluconazole  200 mg one-time dose at emergency department She denies any complaints today      Encounter for examination following treatment at hospital   Hospital chart reviewed, including discharge summary Medications reconciled and reviewed with the patient in detail        Meds ordered this encounter  Medications   Semaglutide ,0.25 or 0.5MG /DOS, 2 MG/3ML SOPN    Sig: Inject 0.5 mg into the skin once a week.    Dispense:  3 mL    Refill:  0    Needs a follow up in the office for further refills   DISCONTD: Semaglutide , 1 MG/DOSE, 4 MG/3ML SOPN    Sig: Inject 1 mg as directed once a week.    Dispense:  3 mL    Refill:  0   insulin  degludec (TRESIBA  FLEXTOUCH) 100 UNIT/ML FlexTouch Pen    Sig: Inject 44 Units into the skin at bedtime.    Dispense:  15 mL    Refill:  3   Semaglutide , 1 MG/DOSE, 4 MG/3ML SOPN    Sig: Inject 1 mg as directed once a week.    Dispense:  3 mL    Refill:  0   FLUoxetine (PROZAC) 20 MG tablet    Sig: Take 1 tablet (20 mg total) by mouth daily.    Dispense:  90 tablet    Refill:  0   metFORMIN  (GLUCOPHAGE -XR) 500 MG 24 hr tablet    Sig: Take 1 tablet (500mg )  by mouth daily with breakfast, after 1 week increase to 500  mg twice daily with food    Dispense:  120 tablet    Refill:  1    Follow-up: Return in about 2 months (around 02/24/2024).    Gavino Fouch R Maliki Gignac, FNP

## 2023-12-26 LAB — CBC
Hematocrit: 36.6 % (ref 34.0–46.6)
Hemoglobin: 10.3 g/dL — ABNORMAL LOW (ref 11.1–15.9)
MCH: 22.7 pg — ABNORMAL LOW (ref 26.6–33.0)
MCHC: 28.1 g/dL — ABNORMAL LOW (ref 31.5–35.7)
MCV: 81 fL (ref 79–97)
Platelets: 672 10*3/uL — ABNORMAL HIGH (ref 150–450)
RBC: 4.53 x10E6/uL (ref 3.77–5.28)
RDW: 15.2 % (ref 11.7–15.4)
WBC: 14.8 10*3/uL — ABNORMAL HIGH (ref 3.4–10.8)

## 2023-12-26 LAB — BASIC METABOLIC PANEL WITH GFR
BUN/Creatinine Ratio: 15 (ref 9–23)
BUN: 11 mg/dL (ref 6–20)
CO2: 21 mmol/L (ref 20–29)
Calcium: 9.6 mg/dL (ref 8.7–10.2)
Chloride: 98 mmol/L (ref 96–106)
Creatinine, Ser: 0.73 mg/dL (ref 0.57–1.00)
Glucose: 150 mg/dL — ABNORMAL HIGH (ref 70–99)
Potassium: 4.1 mmol/L (ref 3.5–5.2)
Sodium: 138 mmol/L (ref 134–144)
eGFR: 114 mL/min/{1.73_m2} (ref >59)

## 2023-12-26 LAB — IRON,TIBC AND FERRITIN PANEL
Ferritin: 34 ng/mL (ref 15–150)
Iron Saturation: 7 % — CL (ref 15–55)
Iron: 17 ug/dL — ABNORMAL LOW (ref 27–159)
Total Iron Binding Capacity: 243 ug/dL — ABNORMAL LOW (ref 250–450)
UIBC: 226 ug/dL (ref 131–425)

## 2023-12-26 LAB — LIPID PANEL
Chol/HDL Ratio: 3.1 ratio (ref 0.0–4.4)
Cholesterol, Total: 180 mg/dL (ref 100–199)
HDL: 58 mg/dL (ref >39)
LDL Chol Calc (NIH): 100 mg/dL — ABNORMAL HIGH (ref 0–99)
Triglycerides: 123 mg/dL (ref 0–149)
VLDL Cholesterol Cal: 22 mg/dL (ref 5–40)

## 2023-12-26 LAB — TSH: TSH: 2.81 u[IU]/mL (ref 0.450–4.500)

## 2023-12-26 LAB — MICROALBUMIN / CREATININE URINE RATIO
Creatinine, Urine: 250 mg/dL
Microalb/Creat Ratio: 48 mg/g{creat} — ABNORMAL HIGH (ref 0–29)
Microalbumin, Urine: 120.9 ug/mL

## 2023-12-27 ENCOUNTER — Other Ambulatory Visit: Payer: Self-pay | Admitting: Nurse Practitioner

## 2023-12-27 ENCOUNTER — Ambulatory Visit: Payer: Self-pay | Admitting: Nurse Practitioner

## 2023-12-27 DIAGNOSIS — E785 Hyperlipidemia, unspecified: Secondary | ICD-10-CM

## 2023-12-27 MED ORDER — ROSUVASTATIN CALCIUM 5 MG PO TABS: 5.0000 mg | ORAL_TABLET | Freq: Every day | ORAL | 1 refills | Status: AC

## 2023-12-31 ENCOUNTER — Telehealth: Payer: Self-pay

## 2023-12-31 ENCOUNTER — Inpatient Hospital Stay: Admitting: Nurse Practitioner

## 2023-12-31 NOTE — Progress Notes (Unsigned)
 Complex Care Management Note  Care Guide Note 12/31/2023 Name: [name] MRN: [id number] DOB: 1995/11/07  Connie Williamson is a 28 y.o. year old female who sees Paseda, Folashade R, FNP for primary care. I reached out to Dekisha D Aldana by phone today to offer complex care management services.  Ms. Standlee was given information about Complex Care Management services today including:   The Complex Care Management services include support from the care team which includes your Nurse Care Manager, Clinical Social Worker, or Pharmacist.  The Complex Care Management team is here to help remove barriers to the health concerns and goals most important to you. Complex Care Management services are voluntary, and the patient may decline or stop services at any time by request to their care team member.   Complex Care Management Consent Status: Patient agreed to services and verbal consent obtained.   Follow up plan:  Telephone appointment with complex care management team member scheduled for:  01-06-24 @ 10:00am with LCSW.   Encounter Outcome:  Patient Scheduled  Leotis Rase [facility], [facility] Guide  Direct Dial: [phone]  Fax [phone]

## 2023-12-31 NOTE — Progress Notes (Unsigned)
 Care Guide Pharmacy Note  12/31/2023 Name: Connie Williamson MRN: [id number] DOB: 11-03-1995  Referred By: Paseda, Folashade R, FNP Reason for referral: Complex Care Management (Initial Outreach to schedule with Pharm D- Lorain FALCON)   Connie Williamson is a 28 y.o. year old female who is a primary care patient of Paseda, Folashade R, FNP.  Connie Williamson was referred to the pharmacist for assistance related to: DMII  An unsuccessful telephone outreach was attempted today to contact the patient who was referred to the pharmacy team for assistance with Medication Adherence. Additional attempts will be made to contact the patient.  Leotis Rase [facility], [facility] Guide  Direct Dial: [phone]  Fax [phone]

## 2024-01-02 ENCOUNTER — Telehealth: Payer: Self-pay

## 2024-01-02 NOTE — Progress Notes (Signed)
 Care Guide Pharmacy Note  01/02/2024 Name: [name] MRN: [id number] DOB: [birth year]  Referred By: Paseda, Folashade R, FNP Reason for referral: Complex Care Management (Unsuccessful Initial Outreach to schedule with Pharm- D Layna)   Connie Williamson is a 28 y.o. year old female who is a primary care patient of Paseda, Folashade R, FNP.  Fallen D Logiudice was referred to the pharmacist for assistance related to: DMII  A second unsuccessful telephone outreach was attempted today to contact the patient who was referred to the pharmacy team for assistance with medication management. Additional attempts will be made to contact the patient.  Leotis Rase [facility], [facility] Guide  Direct Dial: [phone]  Fax [phone]

## 2024-01-02 NOTE — Progress Notes (Signed)
 Care Guide Pharmacy Note  01/02/2024 Name: JAQUAYA COYLE MRN: [id number] DOB: [birth year]  Referred By: Paseda, Folashade R, FNP Reason for referral: Complex Care Management (Initial Outreach to schedule with Pharm D- Layna F and with LCSW)   Nurah D Raygoza is a 28 y.o. year old female who is a primary care patient of Paseda, Folashade R, FNP.  Daris D Courter was referred to the pharmacist for assistance related to: DMII  A third unsuccessful telephone outreach was attempted today to contact the patient who was referred to the pharmacy team for assistance with medication management. The Population Health team is pleased to engage with this patient at any time in the future upon receipt of referral and should he/she be interested in assistance from the [facility] team.  Leotis Rase [facility]  [facility], [facility] Guide  Direct Dial: [phone]  Fax [phone]

## 2024-01-02 NOTE — Progress Notes (Signed)
 Complex Care Management Note  Care Guide Note 01/02/2024 Name: Connie Williamson MRN: [id number] DOB: 20-Apr-1996  Connie Williamson is a 28 y.o. year old female who sees Paseda, Folashade R, FNP for primary care. I reached out to Tmya D Novello by phone today to offer complex care management services.  Connie Williamson was given information about Complex Care Management services today including:   The Complex Care Management services include support from the care team which includes your Nurse Care Manager, Clinical Social Worker, or Pharmacist.  The Complex Care Management team is here to help remove barriers to the health concerns and goals most important to you. Complex Care Management services are voluntary, and the patient may decline or stop services at any time by request to their care team member.   Complex Care Management Consent Status: Patient agreed to services and verbal consent obtained.   Follow up plan:  Telephone appointment with complex care management team member scheduled for:  01-06-2024 @ 10:00am  Encounter Outcome:  Patient Scheduled  Leotis Rase [facility], [facility], [facility] Guide  Direct Dial: [phone]  Fax [phone]

## 2024-01-05 ENCOUNTER — Inpatient Hospital Stay (HOSPITAL_COMMUNITY)
Admission: EM | Admit: 2024-01-05 | Discharge: 2024-01-11 | DRG: 637 | Disposition: A | Discharge status: Home / Self Care | Attending: Family Medicine | Admitting: Family Medicine

## 2024-01-05 ENCOUNTER — Other Ambulatory Visit: Payer: Self-pay

## 2024-01-05 DIAGNOSIS — G4730 Sleep apnea, unspecified: Secondary | ICD-10-CM | POA: Diagnosis present

## 2024-01-05 DIAGNOSIS — Z7985 Long-term (current) use of injectable non-insulin antidiabetic drugs: Secondary | ICD-10-CM

## 2024-01-05 DIAGNOSIS — Z5982 Transportation insecurity: Secondary | ICD-10-CM

## 2024-01-05 DIAGNOSIS — E1165 Type 2 diabetes mellitus with hyperglycemia: Secondary | ICD-10-CM

## 2024-01-05 DIAGNOSIS — A419 Sepsis, unspecified organism: Secondary | ICD-10-CM

## 2024-01-05 DIAGNOSIS — K5900 Constipation, unspecified: Secondary | ICD-10-CM | POA: Diagnosis present

## 2024-01-05 DIAGNOSIS — E440 Moderate protein-calorie malnutrition: Secondary | ICD-10-CM | POA: Diagnosis present

## 2024-01-05 DIAGNOSIS — Z6833 Body mass index (BMI) 33.0-33.9, adult: Secondary | ICD-10-CM

## 2024-01-05 DIAGNOSIS — Z794 Long term (current) use of insulin: Secondary | ICD-10-CM

## 2024-01-05 DIAGNOSIS — B3789 Other sites of candidiasis: Secondary | ICD-10-CM | POA: Diagnosis present

## 2024-01-05 DIAGNOSIS — D75838 Other thrombocytosis: Secondary | ICD-10-CM | POA: Diagnosis present

## 2024-01-05 DIAGNOSIS — N739 Female pelvic inflammatory disease, unspecified: Secondary | ICD-10-CM | POA: Insufficient documentation

## 2024-01-05 DIAGNOSIS — I10 Essential (primary) hypertension: Secondary | ICD-10-CM | POA: Diagnosis present

## 2024-01-05 DIAGNOSIS — Z9049 Acquired absence of other specified parts of digestive tract: Secondary | ICD-10-CM

## 2024-01-05 DIAGNOSIS — K651 Peritoneal abscess: Secondary | ICD-10-CM | POA: Diagnosis present

## 2024-01-05 DIAGNOSIS — D509 Iron deficiency anemia, unspecified: Secondary | ICD-10-CM | POA: Diagnosis present

## 2024-01-05 DIAGNOSIS — N920 Excessive and frequent menstruation with regular cycle: Secondary | ICD-10-CM | POA: Diagnosis present

## 2024-01-05 DIAGNOSIS — K50914 Crohn's disease, unspecified, with abscess: Secondary | ICD-10-CM | POA: Diagnosis present

## 2024-01-05 DIAGNOSIS — E1110 Type 2 diabetes mellitus with ketoacidosis without coma: Secondary | ICD-10-CM | POA: Diagnosis present

## 2024-01-05 DIAGNOSIS — E876 Hypokalemia: Secondary | ICD-10-CM | POA: Diagnosis present

## 2024-01-05 DIAGNOSIS — F32A Depression, unspecified: Secondary | ICD-10-CM | POA: Diagnosis present

## 2024-01-05 DIAGNOSIS — Z79899 Other long term (current) drug therapy: Secondary | ICD-10-CM

## 2024-01-05 DIAGNOSIS — R1030 Lower abdominal pain, unspecified: Secondary | ICD-10-CM | POA: Diagnosis not present

## 2024-01-05 DIAGNOSIS — K5090 Crohn's disease, unspecified, without complications: Secondary | ICD-10-CM | POA: Diagnosis present

## 2024-01-05 DIAGNOSIS — E1011 Type 1 diabetes mellitus with ketoacidosis with coma: Secondary | ICD-10-CM | POA: Diagnosis not present

## 2024-01-05 DIAGNOSIS — E66811 Obesity, class 1: Secondary | ICD-10-CM | POA: Diagnosis present

## 2024-01-05 DIAGNOSIS — Z833 Family history of diabetes mellitus: Secondary | ICD-10-CM

## 2024-01-05 DIAGNOSIS — Z8249 Family history of ischemic heart disease and other diseases of the circulatory system: Secondary | ICD-10-CM

## 2024-01-05 DIAGNOSIS — E669 Obesity, unspecified: Secondary | ICD-10-CM | POA: Diagnosis present

## 2024-01-05 DIAGNOSIS — D75839 Thrombocytosis, unspecified: Secondary | ICD-10-CM | POA: Diagnosis present

## 2024-01-05 DIAGNOSIS — Z7984 Long term (current) use of oral hypoglycemic drugs: Secondary | ICD-10-CM

## 2024-01-05 DIAGNOSIS — F419 Anxiety disorder, unspecified: Secondary | ICD-10-CM | POA: Diagnosis present

## 2024-01-05 DIAGNOSIS — J45909 Unspecified asthma, uncomplicated: Secondary | ICD-10-CM | POA: Diagnosis present

## 2024-01-05 LAB — CBC
HCT: 40.2 % (ref 36.0–46.0)
Hemoglobin: 11.3 g/dL — ABNORMAL LOW (ref 12.0–15.0)
MCH: 22.5 pg — ABNORMAL LOW (ref 26.0–34.0)
MCHC: 28.1 g/dL — ABNORMAL LOW (ref 30.0–36.0)
MCV: 79.9 fL — ABNORMAL LOW (ref 80.0–100.0)
Platelets: 837 10*3/uL — ABNORMAL HIGH (ref 150–400)
RBC: 5.03 MIL/uL (ref 3.87–5.11)
RDW: 17.8 % — ABNORMAL HIGH (ref 11.5–15.5)
WBC: 30.6 10*3/uL — ABNORMAL HIGH (ref 4.0–10.5)
nRBC: 0 % (ref 0.0–0.2)

## 2024-01-05 LAB — URINALYSIS, ROUTINE W REFLEX MICROSCOPIC
Bacteria, UA: NONE SEEN
Bilirubin Urine: NEGATIVE
Glucose, UA: >=500 mg/dL — AB
Hgb urine dipstick: NEGATIVE
Ketones, ur: 80 mg/dL — AB
Leukocytes,Ua: NEGATIVE
Nitrite: NEGATIVE
Protein, ur: 100 mg/dL — AB
Specific Gravity, Urine: 1.02 (ref 1.005–1.030)
pH: 5 (ref 5.0–8.0)

## 2024-01-05 NOTE — ED Triage Notes (Signed)
 Pt with hx of chrohn's states she was admitted about a month ago with a flare.  Has been constipated for several days.  Feels she has not even been able to pass gas.  No fever/chills.  Vomited twice on arrival to ED and states this is the first time she has vomited but has felt nauseous for days

## 2024-01-06 ENCOUNTER — Other Ambulatory Visit: Payer: Self-pay | Admitting: Licensed Clinical Social Worker

## 2024-01-06 ENCOUNTER — Emergency Department (HOSPITAL_COMMUNITY)

## 2024-01-06 ENCOUNTER — Encounter (HOSPITAL_COMMUNITY): Payer: Self-pay

## 2024-01-06 DIAGNOSIS — B3789 Other sites of candidiasis: Secondary | ICD-10-CM | POA: Diagnosis present

## 2024-01-06 DIAGNOSIS — E1110 Type 2 diabetes mellitus with ketoacidosis without coma: Secondary | ICD-10-CM | POA: Diagnosis present

## 2024-01-06 DIAGNOSIS — Z8249 Family history of ischemic heart disease and other diseases of the circulatory system: Secondary | ICD-10-CM | POA: Diagnosis not present

## 2024-01-06 DIAGNOSIS — E66811 Obesity, class 1: Secondary | ICD-10-CM | POA: Diagnosis present

## 2024-01-06 DIAGNOSIS — J45909 Unspecified asthma, uncomplicated: Secondary | ICD-10-CM | POA: Diagnosis present

## 2024-01-06 DIAGNOSIS — K651 Peritoneal abscess: Secondary | ICD-10-CM | POA: Diagnosis present

## 2024-01-06 DIAGNOSIS — Z7985 Long-term (current) use of injectable non-insulin antidiabetic drugs: Secondary | ICD-10-CM | POA: Diagnosis not present

## 2024-01-06 DIAGNOSIS — Z6833 Body mass index (BMI) 33.0-33.9, adult: Secondary | ICD-10-CM | POA: Diagnosis not present

## 2024-01-06 DIAGNOSIS — G4730 Sleep apnea, unspecified: Secondary | ICD-10-CM | POA: Diagnosis present

## 2024-01-06 DIAGNOSIS — Z5982 Transportation insecurity: Secondary | ICD-10-CM | POA: Diagnosis not present

## 2024-01-06 DIAGNOSIS — N739 Female pelvic inflammatory disease, unspecified: Secondary | ICD-10-CM | POA: Diagnosis not present

## 2024-01-06 DIAGNOSIS — D75838 Other thrombocytosis: Secondary | ICD-10-CM | POA: Diagnosis present

## 2024-01-06 DIAGNOSIS — R1030 Lower abdominal pain, unspecified: Secondary | ICD-10-CM | POA: Diagnosis present

## 2024-01-06 DIAGNOSIS — K50914 Crohn's disease, unspecified, with abscess: Secondary | ICD-10-CM | POA: Diagnosis present

## 2024-01-06 DIAGNOSIS — E876 Hypokalemia: Secondary | ICD-10-CM | POA: Diagnosis present

## 2024-01-06 DIAGNOSIS — Z9049 Acquired absence of other specified parts of digestive tract: Secondary | ICD-10-CM | POA: Diagnosis not present

## 2024-01-06 DIAGNOSIS — F32A Depression, unspecified: Secondary | ICD-10-CM | POA: Diagnosis present

## 2024-01-06 DIAGNOSIS — I10 Essential (primary) hypertension: Secondary | ICD-10-CM | POA: Diagnosis present

## 2024-01-06 DIAGNOSIS — K5900 Constipation, unspecified: Secondary | ICD-10-CM | POA: Diagnosis present

## 2024-01-06 DIAGNOSIS — D509 Iron deficiency anemia, unspecified: Secondary | ICD-10-CM | POA: Diagnosis present

## 2024-01-06 DIAGNOSIS — D75839 Thrombocytosis, unspecified: Secondary | ICD-10-CM | POA: Diagnosis present

## 2024-01-06 DIAGNOSIS — E1011 Type 1 diabetes mellitus with ketoacidosis with coma: Secondary | ICD-10-CM | POA: Diagnosis present

## 2024-01-06 DIAGNOSIS — E440 Moderate protein-calorie malnutrition: Secondary | ICD-10-CM | POA: Diagnosis present

## 2024-01-06 DIAGNOSIS — Z833 Family history of diabetes mellitus: Secondary | ICD-10-CM | POA: Diagnosis not present

## 2024-01-06 DIAGNOSIS — F419 Anxiety disorder, unspecified: Secondary | ICD-10-CM | POA: Diagnosis present

## 2024-01-06 DIAGNOSIS — K5000 Crohn's disease of small intestine without complications: Secondary | ICD-10-CM | POA: Diagnosis not present

## 2024-01-06 DIAGNOSIS — Z794 Long term (current) use of insulin: Secondary | ICD-10-CM | POA: Diagnosis not present

## 2024-01-06 DIAGNOSIS — Z7984 Long term (current) use of oral hypoglycemic drugs: Secondary | ICD-10-CM | POA: Diagnosis not present

## 2024-01-06 LAB — COMPREHENSIVE METABOLIC PANEL WITH GFR
ALT: 7 U/L (ref 0–44)
ALT: 8 U/L (ref 0–44)
AST: <5 U/L — ABNORMAL LOW (ref 15–41)
AST: 7 U/L — ABNORMAL LOW (ref 15–41)
Albumin: 3.3 g/dL — ABNORMAL LOW (ref 3.5–5.0)
Albumin: 2.8 g/dL — ABNORMAL LOW (ref 3.5–5.0)
Alkaline Phosphatase: 149 U/L — ABNORMAL HIGH (ref 38–126)
Alkaline Phosphatase: 122 U/L (ref 38–126)
Anion gap: 20 — ABNORMAL HIGH (ref 5–15)
Anion gap: 13 (ref 5–15)
BUN: 9 mg/dL (ref 6–20)
BUN: 7 mg/dL (ref 6–20)
CO2: 9 mmol/L — ABNORMAL LOW (ref 22–32)
CO2: 16 mmol/L — ABNORMAL LOW (ref 22–32)
Calcium: 9.2 mg/dL (ref 8.9–10.3)
Calcium: 9 mg/dL (ref 8.9–10.3)
Chloride: 102 mmol/L (ref 98–111)
Chloride: 106 mmol/L (ref 98–111)
Creatinine, Ser: 0.89 mg/dL (ref 0.44–1.00)
Creatinine, Ser: 0.72 mg/dL (ref 0.44–1.00)
GFR, Estimated: >60 mL/min (ref >60)
GFR, Estimated: >60 mL/min (ref >60)
Glucose, Bld: 404 mg/dL — ABNORMAL HIGH (ref 70–99)
Glucose, Bld: 159 mg/dL — ABNORMAL HIGH (ref 70–99)
Potassium: 4.5 mmol/L (ref 3.5–5.1)
Potassium: 3.6 mmol/L (ref 3.5–5.1)
Sodium: 131 mmol/L — ABNORMAL LOW (ref 135–145)
Sodium: 135 mmol/L (ref 135–145)
Total Bilirubin: 1.4 mg/dL — ABNORMAL HIGH (ref 0.0–1.2)
Total Bilirubin: 1 mg/dL (ref 0.0–1.2)
Total Protein: 9.1 g/dL — ABNORMAL HIGH (ref 6.5–8.1)
Total Protein: 7.3 g/dL (ref 6.5–8.1)

## 2024-01-06 LAB — I-STAT CG4 LACTIC ACID, ED
Lactic Acid, Venous: 1.8 mmol/L (ref 0.5–1.9)
Lactic Acid, Venous: 0.9 mmol/L (ref 0.5–1.9)

## 2024-01-06 LAB — BASIC METABOLIC PANEL WITH GFR
Anion gap: 10 (ref 5–15)
Anion gap: 9 (ref 5–15)
BUN: 6 mg/dL (ref 6–20)
BUN: 6 mg/dL (ref 6–20)
CO2: 21 mmol/L — ABNORMAL LOW (ref 22–32)
CO2: 23 mmol/L (ref 22–32)
Calcium: 8.9 mg/dL (ref 8.9–10.3)
Calcium: 8.7 mg/dL — ABNORMAL LOW (ref 8.9–10.3)
Chloride: 104 mmol/L (ref 98–111)
Chloride: 103 mmol/L (ref 98–111)
Creatinine, Ser: 0.47 mg/dL (ref 0.44–1.00)
Creatinine, Ser: 0.6 mg/dL (ref 0.44–1.00)
GFR, Estimated: >60 mL/min (ref >60)
GFR, Estimated: >60 mL/min (ref >60)
Glucose, Bld: 188 mg/dL — ABNORMAL HIGH (ref 70–99)
Glucose, Bld: 132 mg/dL — ABNORMAL HIGH (ref 70–99)
Potassium: 3.4 mmol/L — ABNORMAL LOW (ref 3.5–5.1)
Potassium: 3.8 mmol/L (ref 3.5–5.1)
Sodium: 135 mmol/L (ref 135–145)
Sodium: 135 mmol/L (ref 135–145)

## 2024-01-06 LAB — CBG MONITORING, ED
Glucose-Capillary: 362 mg/dL — ABNORMAL HIGH (ref 70–99)
Glucose-Capillary: 361 mg/dL — ABNORMAL HIGH (ref 70–99)

## 2024-01-06 LAB — BLOOD GAS, VENOUS
Acid-base deficit: 17.2 mmol/L — ABNORMAL HIGH (ref 0.0–2.0)
Bicarbonate: 9.8 mmol/L — ABNORMAL LOW (ref 20.0–28.0)
O2 Saturation: 73.7 %
Patient temperature: 37
pCO2, Ven: 27 mmHg — ABNORMAL LOW (ref 44–60)
pH, Ven: 7.17 — CL (ref 7.25–7.43)
pO2, Ven: 48 mmHg — ABNORMAL HIGH (ref 32–45)

## 2024-01-06 LAB — GLUCOSE, CAPILLARY
Glucose-Capillary: 253 mg/dL — ABNORMAL HIGH (ref 70–99)
Glucose-Capillary: 161 mg/dL — ABNORMAL HIGH (ref 70–99)
Glucose-Capillary: 177 mg/dL — ABNORMAL HIGH (ref 70–99)
Glucose-Capillary: 162 mg/dL — ABNORMAL HIGH (ref 70–99)
Glucose-Capillary: 175 mg/dL — ABNORMAL HIGH (ref 70–99)
Glucose-Capillary: 178 mg/dL — ABNORMAL HIGH (ref 70–99)
Glucose-Capillary: 158 mg/dL — ABNORMAL HIGH (ref 70–99)
Glucose-Capillary: 157 mg/dL — ABNORMAL HIGH (ref 70–99)
Glucose-Capillary: 152 mg/dL — ABNORMAL HIGH (ref 70–99)
Glucose-Capillary: 141 mg/dL — ABNORMAL HIGH (ref 70–99)
Glucose-Capillary: 145 mg/dL — ABNORMAL HIGH (ref 70–99)
Glucose-Capillary: 138 mg/dL — ABNORMAL HIGH (ref 70–99)
Glucose-Capillary: 131 mg/dL — ABNORMAL HIGH (ref 70–99)
Glucose-Capillary: 188 mg/dL — ABNORMAL HIGH (ref 70–99)

## 2024-01-06 LAB — CBC WITH DIFFERENTIAL/PLATELET
Abs Immature Granulocytes: 0.44 10*3/uL — ABNORMAL HIGH (ref 0.00–0.07)
Basophils Absolute: 0.1 10*3/uL (ref 0.0–0.1)
Basophils Relative: 0 %
Eosinophils Absolute: 0 10*3/uL (ref 0.0–0.5)
Eosinophils Relative: 0 %
HCT: 35.1 % — ABNORMAL LOW (ref 36.0–46.0)
Hemoglobin: 10 g/dL — ABNORMAL LOW (ref 12.0–15.0)
Immature Granulocytes: 2 %
Lymphocytes Relative: 6 %
Lymphs Abs: 1.7 10*3/uL (ref 0.7–4.0)
MCH: 22.6 pg — ABNORMAL LOW (ref 26.0–34.0)
MCHC: 28.5 g/dL — ABNORMAL LOW (ref 30.0–36.0)
MCV: 79.2 fL — ABNORMAL LOW (ref 80.0–100.0)
Monocytes Absolute: 1.2 10*3/uL — ABNORMAL HIGH (ref 0.1–1.0)
Monocytes Relative: 4 %
Neutro Abs: 23.8 10*3/uL — ABNORMAL HIGH (ref 1.7–7.7)
Neutrophils Relative %: 88 %
Platelets: 707 10*3/uL — ABNORMAL HIGH (ref 150–400)
RBC: 4.43 MIL/uL (ref 3.87–5.11)
RDW: 17.4 % — ABNORMAL HIGH (ref 11.5–15.5)
WBC: 27.1 10*3/uL — ABNORMAL HIGH (ref 4.0–10.5)
nRBC: 0 % (ref 0.0–0.2)

## 2024-01-06 LAB — BETA-HYDROXYBUTYRIC ACID
Beta-Hydroxybutyric Acid: >8 mmol/L — ABNORMAL HIGH (ref 0.05–0.27)
Beta-Hydroxybutyric Acid: 2.77 mmol/L — ABNORMAL HIGH (ref 0.05–0.27)
Beta-Hydroxybutyric Acid: 1.06 mmol/L — ABNORMAL HIGH (ref 0.05–0.27)
Beta-Hydroxybutyric Acid: 0.51 mmol/L — ABNORMAL HIGH (ref 0.05–0.27)
Beta-Hydroxybutyric Acid: 0.56 mmol/L — ABNORMAL HIGH (ref 0.05–0.27)

## 2024-01-06 LAB — MAGNESIUM: Magnesium: 1.5 mg/dL — ABNORMAL LOW (ref 1.7–2.4)

## 2024-01-06 LAB — PHOSPHORUS: Phosphorus: 2.9 mg/dL (ref 2.5–4.6)

## 2024-01-06 LAB — HCG, SERUM, QUALITATIVE: Preg, Serum: NEGATIVE

## 2024-01-06 LAB — LIPASE, BLOOD: Lipase: 24 U/L (ref 11–51)

## 2024-01-06 LAB — MRSA NEXT GEN BY PCR, NASAL: MRSA by PCR Next Gen: NOT DETECTED

## 2024-01-06 MED ORDER — FLUOXETINE HCL 20 MG PO CAPS
20.0000 mg | ORAL_CAPSULE | Freq: Every day | ORAL | Status: DC
  Administered 2024-01-07 – 2024-01-11 (×5): 20 mg via ORAL
  Filled 2024-01-06 (×5): qty 1

## 2024-01-06 MED ORDER — INSULIN GLARGINE-YFGN 100 UNIT/ML ~~LOC~~ SOLN: 40.0000 [IU] | Freq: Every day | SUBCUTANEOUS | Status: DC

## 2024-01-06 MED ORDER — MORPHINE SULFATE (PF) 4 MG/ML IV SOLN
4.0000 mg | Freq: Once | INTRAVENOUS | Status: AC
  Administered 2024-01-06: 4 mg via INTRAVENOUS
  Filled 2024-01-06: qty 1

## 2024-01-06 MED ORDER — METRONIDAZOLE 500 MG/100ML IV SOLN
500.0000 mg | Freq: Once | INTRAVENOUS | Status: AC
  Administered 2024-01-06: 500 mg via INTRAVENOUS
  Filled 2024-01-06: qty 100

## 2024-01-06 MED ORDER — INSULIN GLARGINE-YFGN 100 UNIT/ML ~~LOC~~ SOLN
30.0000 [IU] | Freq: Once | SUBCUTANEOUS | Status: DC
  Filled 2024-01-06: qty 0.3

## 2024-01-06 MED ORDER — INSULIN GLARGINE-YFGN 100 UNIT/ML ~~LOC~~ SOLN
20.0000 [IU] | Freq: Once | SUBCUTANEOUS | Status: AC
  Administered 2024-01-06: 20 [IU] via SUBCUTANEOUS
  Filled 2024-01-06: qty 0.2

## 2024-01-06 MED ORDER — IOHEXOL 9 MG/ML PO SOLN
500.0000 mL | ORAL | Status: AC
  Administered 2024-01-06 (×2): 500 mL via ORAL

## 2024-01-06 MED ORDER — DEXTROSE 50 % IV SOLN: 0.0000 mL | INTRAVENOUS | Status: DC | PRN

## 2024-01-06 MED ORDER — METRONIDAZOLE 500 MG/100ML IV SOLN
500.0000 mg | Freq: Two times a day (BID) | INTRAVENOUS | Status: DC
  Administered 2024-01-06 – 2024-01-11 (×10): 500 mg via INTRAVENOUS
  Filled 2024-01-06 (×10): qty 100

## 2024-01-06 MED ORDER — SODIUM CHLORIDE 0.9 % IV SOLN
12.5000 mg | Freq: Four times a day (QID) | INTRAVENOUS | Status: DC | PRN
  Administered 2024-01-06: 12.5 mg via INTRAVENOUS
  Filled 2024-01-06: qty 0.5
  Filled 2024-01-06: qty 12.5

## 2024-01-06 MED ORDER — HYDROMORPHONE HCL 1 MG/ML IJ SOLN
0.5000 mg | INTRAMUSCULAR | Status: DC | PRN
  Administered 2024-01-06 – 2024-01-11 (×15): 0.5 mg via INTRAVENOUS
  Filled 2024-01-06: qty 1
  Filled 2024-01-06: qty 0.5
  Filled 2024-01-06: qty 1
  Filled 2024-01-06: qty 0.5
  Filled 2024-01-06: qty 1
  Filled 2024-01-06: qty 0.5
  Filled 2024-01-06: qty 1
  Filled 2024-01-06: qty 0.5
  Filled 2024-01-06 (×3): qty 1
  Filled 2024-01-06: qty 0.5
  Filled 2024-01-06: qty 1
  Filled 2024-01-06 (×2): qty 0.5
  Filled 2024-01-06 (×2): qty 1

## 2024-01-06 MED ORDER — INSULIN DEGLUDEC 100 UNIT/ML ~~LOC~~ SOPN: 40.0000 [IU] | PEN_INJECTOR | Freq: Every day | SUBCUTANEOUS | Status: DC

## 2024-01-06 MED ORDER — INSULIN GLARGINE-YFGN 100 UNIT/ML ~~LOC~~ SOLN: 20.0000 [IU] | Freq: Once | SUBCUTANEOUS | Status: DC

## 2024-01-06 MED ORDER — ORAL CARE MOUTH RINSE: 15.0000 mL | OROMUCOSAL | Status: DC | PRN

## 2024-01-06 MED ORDER — LACTATED RINGERS IV BOLUS
20.0000 mL/kg | Freq: Once | INTRAVENOUS | Status: AC
  Administered 2024-01-06: 1760 mL via INTRAVENOUS

## 2024-01-06 MED ORDER — IOHEXOL 9 MG/ML PO SOLN
ORAL | Status: AC
  Filled 2024-01-06: qty 1000

## 2024-01-06 MED ORDER — SODIUM CHLORIDE 0.9 % IV SOLN
12.5000 mg | Freq: Four times a day (QID) | INTRAVENOUS | Status: DC | PRN
  Administered 2024-01-06 – 2024-01-07 (×3): 12.5 mg via INTRAVENOUS
  Filled 2024-01-06: qty 0.5
  Filled 2024-01-06 (×3): qty 12.5

## 2024-01-06 MED ORDER — PROCHLORPERAZINE EDISYLATE 10 MG/2ML IJ SOLN
5.0000 mg | Freq: Once | INTRAMUSCULAR | Status: AC
  Administered 2024-01-06: 5 mg via INTRAVENOUS
  Filled 2024-01-06: qty 2

## 2024-01-06 MED ORDER — ACETAMINOPHEN 325 MG PO TABS
650.0000 mg | ORAL_TABLET | Freq: Four times a day (QID) | ORAL | Status: DC | PRN
  Administered 2024-01-07 – 2024-01-10 (×3): 650 mg via ORAL
  Filled 2024-01-06 (×3): qty 2

## 2024-01-06 MED ORDER — INSULIN REGULAR(HUMAN) IN NACL 100-0.9 UT/100ML-% IV SOLN
INTRAVENOUS | Status: DC
  Administered 2024-01-06: 19 [IU]/h via INTRAVENOUS
  Administered 2024-01-06: 7 [IU]/h via INTRAVENOUS
  Filled 2024-01-06 (×2): qty 100

## 2024-01-06 MED ORDER — SODIUM CHLORIDE 0.9 % IV SOLN
2.0000 g | Freq: Once | INTRAVENOUS | Status: AC
  Administered 2024-01-06: 2 g via INTRAVENOUS
  Filled 2024-01-06: qty 20

## 2024-01-06 MED ORDER — NALOXONE HCL 0.4 MG/ML IJ SOLN: 0.4000 mg | INTRAMUSCULAR | Status: DC | PRN

## 2024-01-06 MED ORDER — SODIUM CHLORIDE 0.9% FLUSH: 10.0000 mL | INTRAVENOUS | Status: DC | PRN

## 2024-01-06 MED ORDER — LACTATED RINGERS IV BOLUS (SEPSIS)
1000.0000 mL | Freq: Once | INTRAVENOUS | Status: AC
  Administered 2024-01-06: 1000 mL via INTRAVENOUS

## 2024-01-06 MED ORDER — CHLORHEXIDINE GLUCONATE CLOTH 2 % EX PADS
6.0000 | MEDICATED_PAD | Freq: Every day | CUTANEOUS | Status: DC
  Administered 2024-01-06 – 2024-01-11 (×5): 6 via TOPICAL

## 2024-01-06 MED ORDER — POTASSIUM CHLORIDE 10 MEQ/100ML IV SOLN
10.0000 meq | INTRAVENOUS | Status: AC
  Administered 2024-01-06 (×2): 10 meq via INTRAVENOUS
  Filled 2024-01-06 (×2): qty 100

## 2024-01-06 MED ORDER — ACETAMINOPHEN 650 MG RE SUPP: 650.0000 mg | Freq: Four times a day (QID) | RECTAL | Status: DC | PRN

## 2024-01-06 MED ORDER — SODIUM CHLORIDE 0.9 % IV SOLN
2.0000 g | INTRAVENOUS | Status: DC
  Administered 2024-01-07 – 2024-01-10 (×5): 2 g via INTRAVENOUS
  Filled 2024-01-06 (×5): qty 20

## 2024-01-06 MED ORDER — IOHEXOL 300 MG/ML  SOLN
100.0000 mL | Freq: Once | INTRAMUSCULAR | Status: AC | PRN
  Administered 2024-01-06: 100 mL via INTRAVENOUS

## 2024-01-06 MED ORDER — INSULIN DEGLUDEC 100 UNIT/ML ~~LOC~~ SOPN: 30.0000 [IU] | PEN_INJECTOR | Freq: Once | SUBCUTANEOUS | Status: DC

## 2024-01-06 MED ORDER — ONDANSETRON HCL 4 MG/2ML IJ SOLN
4.0000 mg | Freq: Four times a day (QID) | INTRAMUSCULAR | Status: DC | PRN
  Administered 2024-01-06 – 2024-01-07 (×5): 4 mg via INTRAVENOUS
  Filled 2024-01-06 (×5): qty 2

## 2024-01-06 MED ORDER — POTASSIUM CHLORIDE 10 MEQ/100ML IV SOLN
10.0000 meq | INTRAVENOUS | Status: AC
  Administered 2024-01-06 (×4): 10 meq via INTRAVENOUS
  Filled 2024-01-06 (×4): qty 100

## 2024-01-06 MED ORDER — ONDANSETRON HCL 4 MG/2ML IJ SOLN
4.0000 mg | Freq: Once | INTRAMUSCULAR | Status: AC
Start: 2024-01-06 — End: 2024-01-06
  Administered 2024-01-06: 4 mg via INTRAVENOUS
  Filled 2024-01-06: qty 2

## 2024-01-06 MED ORDER — LACTATED RINGERS IV SOLN: INTRAVENOUS | Status: DC

## 2024-01-06 MED ORDER — MAGNESIUM SULFATE 2 GM/50ML IV SOLN
2.0000 g | Freq: Once | INTRAVENOUS | Status: AC
  Administered 2024-01-06: 2 g via INTRAVENOUS
  Filled 2024-01-06: qty 50

## 2024-01-06 MED ORDER — ROSUVASTATIN CALCIUM 10 MG PO TABS: 5.0000 mg | ORAL_TABLET | Freq: Every day | ORAL | Status: DC

## 2024-01-06 MED ORDER — LACTATED RINGERS IV SOLN: INTRAVENOUS | Status: AC

## 2024-01-06 MED ORDER — DEXTROSE IN LACTATED RINGERS 5 % IV SOLN: INTRAVENOUS | Status: AC

## 2024-01-06 NOTE — H&P (Signed)
 History and Physical    Patient: Connie Williamson FMW:[id number] DOB: [birth year] DOA: 01/05/2024 DOS: the patient was seen and examined on 01/06/2024 PCP: Paseda, Folashade R, FNP  Patient coming from: Home  Chief Complaint:  Chief Complaint  Patient presents with   Abdominal Pain   HPI: Connie Williamson is a 28 y.o. female with medical history significant of asthma, hypertension, DM [name], class I obesity, type 1 diabetes, anxiety and depression, iron deficiency anemia, Crohn's disease who presented to the emergency department complaints of abdominal pain, nausea and multiple episodes of emesis.  She has been constipated and nauseous for several days, but started vomiting yesterday.  Last month she was admitted for Crohn's disease.   No diarrhea, melena or hematochezia.  No flank pain, dysuria, frequency or hematuria.  She denied fever, chills, rhinorrhea, sore throat, wheezing or hemoptysis.  No chest pain, palpitations, diaphoresis, PND, orthopnea or pitting edema of the lower extremities. No polyuria, polydipsia, polyphagia or blurred vision.   Lab work: Urinalysis showed greater than 500 glucose, ketones of 80 and protein of 100 mg/dL.  CBC showed white count 30.6, hemoglobin 11.3 g/dL platelets 162.  Lipase was 24 units/L.  Lactic acid was 1.8 and then 0.9 mmol/L.  CMP showed bicarb of 9 mmol/L with an anion gap of 20, the rest of the electrolytes and renal function were normal.  Glucose 104 and total bilirubin 1.4 mg/dL.  Total protein 9.1 and albumin 3.3 g/dL.  AST less than 5, ALT 7 and alkaline phosphatase 149 units/L.  Serum pregnancy test was negative.  Beta hydroxybutyric acid was greater than 8.0 and then 2.77 mmol/L.  Venous blood gas showed a pH of 7.17 with a PCO2 of 27 and PO2 of 48 mmHg.  Bicarbonate was 9.9 acid-base deficit 17.2 mmol/L.  Magnesium was 1.5 mg/dL.  Imaging: CT abdomen/pelvis with contrast showed a 5.5 x 5.2 x 6.8 irregular ring-enhancing hypodense collection  in the upper pelvis just above the uterus and the bladder midline partially occluding the inflamed neoterminal ileum.  Right ovary again noted large and enhancing measuring 4.8 x 3.9 which could indicate secondary inflammation from adjacent inflamed bowel or infection including possible tubo-ovarian abscess.  Mild pelvic cul-de-sac fluid likely reactive.  Constipation.  Stable borderline to slightly prominent mesenteric and right common iliac chain lymph nodes.  Slightly enlarging and steatotic liver.  ED course: Initial vital signs were temperature 98.3 F, pulse 114 respiration 20, BP 148/107 mmHg O2 sat 100% on room air.  The patient received 2760 mL of LR bolus, ceftriaxone  2 g IVPB, metronidazole  500 mg IVPB, morphine  4 mg IVP, ondansetron  4 mg IVP, Compazine 5 mg IVP, KCl 10 mEq IVPB x 2 and was started on an insulin  infusion.   Review of Systems: As mentioned in the history of present illness. All other systems reviewed and are negative.  Past Medical History:  Diagnosis Date   Asthma    Crohn's disease (HCC)    Diabetes mellitus    Intramural leiomyoma of uterus    Menorrhagia    Past Surgical History:  Procedure Laterality Date   ADENOIDECTOMY     BOWEL RESECTION     TONSILLECTOMY     Social History:  reports that she has never smoked. She has never used smokeless tobacco. She reports that she does not currently use alcohol. She reports that she does not use drugs.  No Known Allergies  Family History  Problem Relation Age of Onset   Diabetes Mother  Kidney failure Mother    Hypertension Mother    Healthy Father    Healthy Sister    Healthy Brother    Diabetes Maternal Grandmother    Hypertension Maternal Grandmother    Diabetes Maternal Grandfather    Hypertension Maternal Grandfather     Prior to Admission medications   Medication Sig Start Date End Date Taking? Authorizing Provider  amoxicillin-clavulanate (AUGMENTIN) 875-125 MG tablet Take 1 tablet by mouth 2  (two) times daily. 12/15/23  Yes [provider]  Na Sulfate-K Sulfate-Mg Sulfate concentrate (SUPREP) 17.5-3.13-1.6 GM/177ML SOLN Take 177 mLs by mouth. 12/19/23  Yes [provider]  Continuous Glucose Sensor (DEXCOM G7 SENSOR) MISC Please use as directed. 01/23/23   Paseda, Folashade R, FNP  fluconazole  (DIFLUCAN ) 200 MG tablet Take 1 tablet (200 mg total) by mouth daily as needed for up to 1 dose. Patient not taking: Reported on 12/25/2023 12/12/23   Cottie Donnice PARAS, MD  FLUoxetine  (PROZAC ) 20 MG tablet Take 1 tablet (20 mg total) by mouth daily. 12/25/23   Paseda, Folashade R, FNP  insulin  degludec (TRESIBA  FLEXTOUCH) 100 UNIT/ML FlexTouch Pen Inject 44 Units into the skin at bedtime. 12/25/23   Paseda, Folashade R, FNP  metFORMIN  (GLUCOPHAGE -XR) 500 MG 24 hr tablet Take 1 tablet (500mg ) by mouth daily with breakfast, after 1 week increase to 500 mg twice daily with food 12/25/23   Paseda, Folashade R, FNP  ondansetron  (ZOFRAN -ODT) 4 MG disintegrating tablet Take 1 tablet (4 mg total) by mouth every 8 (eight) hours as needed for nausea or vomiting. 12/12/23   Dreama Longs, MD  oxyCODONE  (ROXICODONE ) 5 MG immediate release tablet Take 1 tablet (5 mg total) by mouth every 4 (four) hours as needed for severe pain (pain score 7-10). 12/12/23   Dreama Longs, MD  polyethylene glycol powder (MIRALAX ) 17 GM/SCOOP powder Take 17 g by mouth 2 (two) times daily. Decrease to daily if you develop diarrhea. 11/17/23   Briana Elgin LABOR, MD  predniSONE  (DELTASONE ) 10 MG tablet Take 40mg  (4 tablets) a day for 1 week, 30mg  a day (3 tablets) for the second week, 20mg  (2 tablets) a day for the third week, 10mg  (1 tablet) a day for the fourth week. 12/12/23   Dreama Longs, MD  rosuvastatin  (CRESTOR ) 5 MG tablet Take 1 tablet (5 mg total) by mouth daily. 12/27/23 12/26/24  Paseda, Folashade R, FNP  Semaglutide , 1 MG/DOSE, 4 MG/3ML SOPN Inject 1 mg as directed once a week. 01/22/24   Paseda, Folashade R, FNP   Semaglutide ,0.25 or 0.5MG /DOS, 2 MG/3ML SOPN Inject 0.5 mg into the skin once a week. 12/25/23   Paseda, Folashade R, FNP    Physical Exam: Vitals:   01/06/24 0433 01/06/24 0500 01/06/24 0530 01/06/24 0600  BP: (!) 167/100 (!) 154/67  137/75  Pulse:  (!) 110  (!) 103  Resp:  (!) 21  (!) 29  Temp: 97.9 F (36.6 C)     TempSrc: Oral     SpO2: 100% 100%  100%  Weight:   85.3 kg   Height:   5' 3 (1.6 m)    Physical Exam Vitals and nursing note reviewed.  Constitutional:      General: She is awake. She is not in acute distress.    Appearance: She is obese. She is ill-appearing.  HENT:     Head: Normocephalic.     Nose: No rhinorrhea.     Mouth/Throat:     Mouth: Mucous membranes are dry.  Eyes:     General: No scleral icterus.    Pupils: Pupils are equal, round, and reactive to light.   Neck:     Vascular: No JVD.   Cardiovascular:     Rate and Rhythm: Regular rhythm. Tachycardia present.     Heart sounds: S1 normal and S2 normal.  Pulmonary:     Effort: Pulmonary effort is normal.     Breath sounds: Normal breath sounds. No wheezing, rhonchi or rales.  Abdominal:     General: Abdomen is protuberant. Bowel sounds are normal. There is no distension.     Palpations: Abdomen is soft.     Tenderness: There is abdominal tenderness in the right lower quadrant. There is no right CVA tenderness, left CVA tenderness or guarding.   Musculoskeletal:     Cervical back: Neck supple.     Right lower leg: No edema.     Left lower leg: No edema.   Skin:    General: Skin is warm and dry.   Neurological:     General: No focal deficit present.     Mental Status: She is alert and oriented to person, place, and time.   Psychiatric:        Mood and Affect: Mood normal.        Behavior: Behavior normal. Behavior is cooperative.     Data Reviewed:  Results are pending, will review when available.  Assessment and Plan: Principal Problem:   Uncontrolled type 2 diabetes  mellitus with hyperglycemia,    with long-term current use of insulin  (HCC) Presenting with:   DKA (diabetic ketoacidosis) (HCC) Inpatient/stepdown. Keep NPO for now. Continue IV fluids. Continue insulin  infusion. Monitor CBG closely. BMP every 4 hours. BHA every 8 hours. Replace electrolytes as needed. Consult diabetes coordinator. Transition to SQ insulin  per Endo tool.  Active Problems:   Pelvic abscess in female  Continue ceftriaxone  2 g IVPB every 24 hours. Continue metronidazole  500 mg IVPB every 12 hours. Analgesics as needed. Antiemetics as needed. General surgery appreciated. IR will drain abscess. Cell count, Gram stain and C&S requested    Crohn's disease (HCC) GI consult requested.    Essential hypertension, benign Parenteral antihypertensives PRN while NPO.    Asthma Bronchodilators as needed.    Sleep apnea Given intra-abdominal issues. Hold CPAP for now.    Thrombocytosis Secondary to anemia and acute illness.    Iron deficiency anemia Monitor hematocrit hemoglobin.    Anxiety and depression Resume fluoxetine  once cleared for oral intake.    Moderate protein malnutrition (HCC) In the setting of anemia and acute illness. May benefit from protein supplementation. Consider nutritional services evaluation. Follow-up albumin level.    Hypomagnesemia Replaced. Follow level as needed.    Obesity (BMI 30-39.9) Current BMI 33.31 kg/m. Would benefit from lifestyle modifications. Follow-up closely with PCP.     Advance Care Planning:   Code Status: Full Code   Consults:   Family Communication:   Severity of Illness: The appropriate patient status for this patient is INPATIENT. Inpatient status is judged to be reasonable and necessary in order to provide the required intensity of service to ensure the patient's safety. The patient's presenting symptoms, physical exam findings, and initial radiographic and laboratory data in the context of  their chronic comorbidities is felt to place them at high risk for further clinical deterioration. Furthermore, it is not anticipated that the patient will be medically stable for discharge from the hospital within 2 midnights of admission.   *  I certify that at the point of admission it is my clinical judgment that the patient will require inpatient hospital care spanning beyond 2 midnights from the point of admission due to high intensity of service, high risk for further deterioration and high frequency of surveillance required.*  Author: Alm Dorn Castor, MD 01/06/2024 7:37 AM  For on call review [email or web address].   This document was prepared using Dragon voice recognition software and may contain some unintended transcription errors.

## 2024-01-06 NOTE — Sepsis Progress Note (Signed)
 Elink following for sepsis protocol.

## 2024-01-06 NOTE — Consult Note (Signed)
 CC: abd pain, nausea  Requesting provider: Dr Celinda  HPI: Connie Williamson is an 28 y.o. female who is here for abd pain and DKA.  She has a h/o DM and crohn's disease for which she follows with [facility].  It appears she has been on a Prednisone  taper recently.  CT shows intra-abdominal abscess/phlegmon at site of previous ileocolonic anastomosis.    Past Medical History:  Diagnosis Date   Asthma    Crohn's disease (HCC)    Diabetes mellitus    Intramural leiomyoma of uterus    Menorrhagia     Past Surgical History:  Procedure Laterality Date   ADENOIDECTOMY     BOWEL RESECTION     TONSILLECTOMY      Family History  Problem Relation Age of Onset   Diabetes Mother    Kidney failure Mother    Hypertension Mother    Healthy Father    Healthy Sister    Healthy Brother    Diabetes Maternal Grandmother    Hypertension Maternal Grandmother    Diabetes Maternal Grandfather    Hypertension Maternal Grandfather     Social:  reports that she has never smoked. She has never used smokeless tobacco. She reports that she does not currently use alcohol. She reports that she does not use drugs.  Allergies: No Known Allergies  Medications: I have reviewed the patient's current medications.  Results for orders placed or performed during the hospital encounter of 01/05/24 (from the past 48 hours)  Lipase, blood     Status: None   Collection Time: 01/05/24 10:36 PM  Result Value Ref Range   Lipase 24 11 - 51 U/L    Comment: Performed at [facility], [address] [place], [place] [address]  Comprehensive metabolic panel     Status: Abnormal   Collection Time: 01/05/24 10:36 PM  Result Value Ref Range   Sodium 131 (L) 135 - 145 mmol/L   Potassium 4.5 3.5 - 5.1 mmol/L   Chloride 102 98 - 111 mmol/L   CO2 9 (L) 22 - 32 mmol/L   Glucose, Bld 404 (H) 70 - 99 mg/dL    Comment: Glucose reference range applies only to samples taken after fasting for at  least 8 hours.   BUN 9 6 - 20 mg/dL   Creatinine, Ser 9.10 0.44 - 1.00 mg/dL   Calcium  9.2 8.9 - 10.3 mg/dL   Total Protein 9.1 (H) 6.5 - 8.1 g/dL   Albumin 3.3 (L) 3.5 - 5.0 g/dL   AST <5 (L) 15 - 41 U/L   ALT 7 0 - 44 U/L   Alkaline Phosphatase 149 (H) 38 - 126 U/L   Total Bilirubin 1.4 (H) 0.0 - 1.2 mg/dL   GFR, Estimated >39 >39 mL/min    Comment: (NOTE) Calculated using the CKD-EPI Creatinine Equation (2021)    Anion gap 20 (H) 5 - 15    Comment: Performed at [facility], [address] [place], [place] [address]  CBC     Status: Abnormal   Collection Time: 01/05/24 10:36 PM  Result Value Ref Range   WBC 30.6 (H) 4.0 - 10.5 K/uL   RBC 5.03 3.87 - 5.11 MIL/uL   Hemoglobin 11.3 (L) 12.0 - 15.0 g/dL   HCT 59.7 63.9 - 53.9 %   MCV 79.9 (L) 80.0 - 100.0 fL   MCH 22.5 (L) 26.0 - 34.0 pg   MCHC 28.1 (L) 30.0 - 36.0 g/dL  RDW 17.8 (H) 11.5 - 15.5 %   Platelets 837 (H) 150 - 400 K/uL   nRBC 0.0 0.0 - 0.2 %    Comment: Performed at [facility], [address], [place], [place] [address]  hCG, serum, qualitative     Status: None   Collection Time: 01/05/24 10:36 PM  Result Value Ref Range   Preg, Serum NEGATIVE NEGATIVE    Comment:        THE SENSITIVITY OF THIS METHODOLOGY IS >10 mIU/mL. Performed at [facility], [address] [place], [place] [address]   Urinalysis, Routine w reflex microscopic -Urine, Clean Catch     Status: Abnormal   Collection Time: 01/05/24 10:55 PM  Result Value Ref Range   Color, Urine YELLOW YELLOW   APPearance CLEAR CLEAR   Specific Gravity, Urine 1.020 1.005 - 1.030   pH 5.0 5.0 - 8.0   Glucose, UA >=500 (A) NEGATIVE mg/dL   Hgb urine dipstick NEGATIVE NEGATIVE   Bilirubin Urine NEGATIVE NEGATIVE   Ketones, ur 80 (A) NEGATIVE mg/dL   Protein, ur 899 (A) NEGATIVE mg/dL   Nitrite NEGATIVE NEGATIVE   Leukocytes,Ua NEGATIVE NEGATIVE   RBC / HPF 0-5 0 - 5 RBC/hpf   WBC, UA 0-5 0 - 5  WBC/hpf   Bacteria, UA NONE SEEN NONE SEEN   Squamous Epithelial / HPF 0-5 0 - 5 /HPF    Comment: Performed at [facility], [address], [place], [place] [address]  I-Stat CG4 Lactic Acid     Status: None   Collection Time: 01/06/24 12:57 AM  Result Value Ref Range   Lactic Acid, Venous 1.8 0.5 - 1.9 mmol/L  Blood gas, venous (at [facility])     Status: Abnormal   Collection Time: 01/06/24  1:23 AM  Result Value Ref Range   pH, Ven 7.17 (LL) 7.25 - 7.43    Comment: CRITICAL RESULT CALLED TO, READ BACK BY AND VERIFIED WITH: RSABRA LIKE, RN [phone] 01/06/24 BY V. NICANOR    pCO2, Ven 27 (L) 44 - 60 mmHg   pO2, Ven 48 (H) 32 - 45 mmHg   Bicarbonate 9.8 (L) 20.0 - 28.0 mmol/L   Acid-base deficit 17.2 (H) 0.0 - 2.0 mmol/L   O2 Saturation 73.7 %   Patient temperature 37.0     Comment: Performed at [facility], [address] [place], [place] [address]  CBG monitoring, ED     Status: Abnormal   Collection Time: 01/06/24  2:39 AM  Result Value Ref Range   Glucose-Capillary 362 (H) 70 - 99 mg/dL    Comment: Glucose reference range applies only to samples taken after fasting for at least 8 hours.  Beta-hydroxybutyric acid     Status: Abnormal   Collection Time: 01/06/24  2:41 AM  Result Value Ref Range   Beta-Hydroxybutyric Acid >8.00 (H) 0.05 - 0.27 mmol/L    Comment: RESULT CONFIRMED BY MANUAL DILUTION Performed at [facility], [address], [place], [place] [address]   I-Stat CG4 Lactic Acid     Status: None   Collection Time: 01/06/24  2:50 AM  Result Value Ref Range   Lactic Acid, Venous 0.9 0.5 - 1.9 mmol/L  MRSA Next Gen by PCR, Nasal     Status: None   Collection Time: 01/06/24  3:39 AM   Specimen: Nasal Mucosa; Nasal Swab  Result Value Ref Range   MRSA by PCR Next Gen NOT  DETECTED NOT DETECTED    Comment: (NOTE) The GeneXpert MRSA Assay (FDA approved for NASAL specimens only), is one component of a comprehensive  MRSA colonization surveillance program. It is not intended to diagnose MRSA infection nor to guide or monitor treatment for MRSA infections. Test performance is not FDA approved in patients less than 62 years old. Performed at [facility], [address], [place], [place] [address]   CBG monitoring, ED     Status: Abnormal   Collection Time: 01/06/24  3:40 AM  Result Value Ref Range   Glucose-Capillary 361 (H) 70 - 99 mg/dL    Comment: Glucose reference range applies only to samples taken after fasting for at least 8 hours.  Glucose, capillary     Status: Abnormal   Collection Time: 01/06/24  5:05 AM  Result Value Ref Range   Glucose-Capillary 253 (H) 70 - 99 mg/dL    Comment: Glucose reference range applies only to samples taken after fasting for at least 8 hours.  Beta-hydroxybutyric acid     Status: Abnormal   Collection Time: 01/06/24  5:32 AM  Result Value Ref Range   Beta-Hydroxybutyric Acid 2.77 (H) 0.05 - 0.27 mmol/L    Comment: Performed at [facility], [address], [place], [place] [address]  CBC with Differential/Platelet     Status: Abnormal   Collection Time: 01/06/24  5:32 AM  Result Value Ref Range   WBC 27.1 (H) 4.0 - 10.5 K/uL   RBC 4.43 3.87 - 5.11 MIL/uL   Hemoglobin 10.0 (L) 12.0 - 15.0 g/dL   HCT 64.8 (L) 63.9 - 53.9 %   MCV 79.2 (L) 80.0 - 100.0 fL   MCH 22.6 (L) 26.0 - 34.0 pg   MCHC 28.5 (L) 30.0 - 36.0 g/dL   RDW 82.5 (H) 88.4 - 84.4 %   Platelets 707 (H) 150 - 400 K/uL   nRBC 0.0 0.0 - 0.2 %   Neutrophils Relative % 88 %   Neutro Abs 23.8 (H) 1.7 - 7.7 K/uL   Lymphocytes Relative 6 %   Lymphs Abs 1.7 0.7 - 4.0 K/uL   Monocytes Relative 4 %   Monocytes Absolute 1.2 (H) 0.1 - 1.0 K/uL   Eosinophils Relative 0 %   Eosinophils Absolute 0.0 0.0 - 0.5 K/uL   Basophils Relative 0 %   Basophils Absolute 0.1 0.0 - 0.1 K/uL   Immature Granulocytes 2 %   Abs Immature Granulocytes 0.44 (H) 0.00 - 0.07 K/uL     Comment: Performed at [facility], [address] [place], [place] [address]  Comprehensive metabolic panel with GFR     Status: Abnormal   Collection Time: 01/06/24  5:32 AM  Result Value Ref Range   Sodium 135 135 - 145 mmol/L   Potassium 3.6 3.5 - 5.1 mmol/L   Chloride 106 98 - 111 mmol/L   CO2 16 (L) 22 - 32 mmol/L   Glucose, Bld 159 (H) 70 - 99 mg/dL    Comment: Glucose reference range applies only to samples taken after fasting for at least 8 hours.   BUN 7 6 - 20 mg/dL   Creatinine, Ser 9.27 0.44 - 1.00 mg/dL   Calcium  9.0 8.9 - 10.3 mg/dL   Total Protein 7.3 6.5 - 8.1 g/dL   Albumin 2.8 (L) 3.5 - 5.0 g/dL   AST 7 (L) 15 - 41 U/L   ALT 8 0 - 44 U/L   Alkaline Phosphatase 122 38 -  126 U/L   Total Bilirubin 1.0 0.0 - 1.2 mg/dL   GFR, Estimated >39 >39 mL/min    Comment: (NOTE) Calculated using the CKD-EPI Creatinine Equation (2021)    Anion gap 13 5 - 15    Comment: Performed at [facility], [address], [place], [place] [address]  Magnesium     Status: Abnormal   Collection Time: 01/06/24  5:32 AM  Result Value Ref Range   Magnesium 1.5 (L) 1.7 - 2.4 mg/dL    Comment: Performed at [facility], [address] [place], [place] [address]  Glucose, capillary     Status: Abnormal   Collection Time: 01/06/24  6:08 AM  Result Value Ref Range   Glucose-Capillary 161 (H) 70 - 99 mg/dL    Comment: Glucose reference range applies only to samples taken after fasting for at least 8 hours.  Glucose, capillary     Status: Abnormal   Collection Time: 01/06/24  7:05 AM  Result Value Ref Range   Glucose-Capillary 177 (H) 70 - 99 mg/dL    Comment: Glucose reference range applies only to samples taken after fasting for at least 8 hours.  Glucose, capillary     Status: Abnormal   Collection Time: 01/06/24  8:09 AM  Result Value Ref Range   Glucose-Capillary 162 (H) 70 - 99 mg/dL    Comment: Glucose reference range applies  only to samples taken after fasting for at least 8 hours.    CT ABDOMEN PELVIS W CONTRAST Result Date: 01/06/2024 CLINICAL DATA:  Bowel obstruction suspected.  Sepsis. EXAM: CT ABDOMEN AND PELVIS WITH CONTRAST TECHNIQUE: Multidetector CT imaging of the abdomen and pelvis was performed using the standard protocol following bolus administration of intravenous contrast. RADIATION DOSE REDUCTION: This exam was performed according to the departmental dose-optimization program which includes automated exposure control, adjustment of the mA and/or kV according to patient size and/or use of iterative reconstruction technique. CONTRAST:  OMNIPAQUE  IOHEXOL  300 MG/ML  SOLN COMPARISON:  CTs with IV contrast 12/12/2023, 11/15/2023 and CT pelvis with contrast 03/04/2022. Pelvic ultrasound 12/12/2023. FINDINGS: Lower chest: No abnormality. Hepatobiliary: 19 cm in length liver, slightly steatotic without mass. Unremarkable gallbladder and bile ducts. Pancreas: No abnormality. Spleen: No abnormality. Adrenals/Urinary Tract: No abnormality. Stomach/Bowel: Unremarkable contracted stomach. Normal caliber upper to mid abdominal small bowel. Unremarkable small bowel anastomosis anterior left lower abdominopelvic quadrant. Remote ileocecectomy with primary surgical anastomosis, again noted inflamed appearance in the distal ileum leading up to the anastomosis. There is interval development of an irregular rim enhancing hypodense collection in the upper pelvis just above the uterus and bladder in the midline measuring 5.5 x 5.2 x 6.8 cm partially abutting the inflamed neoterminal ileum. Its Hounsfield density is above that of simple fluid ranging 41-47 which could indicate viscous abscess fluid, phlegmon or hematoma. There is no air in the collection. It is also possible this could communicate with 1 or more of the surrounding small bowel segments which it abuts. There are no dilated segments to suspect mechanical obstruction. The  colon wall is normal thickness with mild fecal stasis. Vascular/Lymphatic: No significant vascular findings. Borderline to slightly prominent mesenteric lymph nodes are unchanged. There is a right common iliac chain node measuring 1 cm in short axis, unchanged. No new adenopathy. Reproductive: The the uterus is intact. Left ovary unremarkable. Right ovary again noted enlarged and enhancing measuring 4.8 x 3.9 cm which could indicate secondary inflammation from adjacent inflamed bowel or infection including  possible tubo-ovarian abscess. It is only slightly larger than 25 days ago. Other: Mild pelvic cul-de-sac fluid is likely reactive. No other free fluid is seen. No free air or abdominal wall hernia. Musculoskeletal: No acute or significant osseous findings. IMPRESSION: 1. 5.5 x 5.2 x 6.8 cm irregular rim enhancing hypodense collection in the upper pelvis just above the uterus and bladder in the midline partially abutting the inflamed neoterminal ileum. Its Hounsfield density is above that of simple fluid which could indicate viscous abscess fluid, phlegmon or hematoma. It is also possible this could communicate with 1 or more of the surrounding small bowel segments which it abuts. 2. Right ovary again noted enlarged and enhancing measuring 4.8 x 3.9 cm which could indicate secondary inflammation from adjacent inflamed bowel or infection including possible tubo-ovarian abscess. It is only slightly larger than 25 days ago. 3. Mild pelvic cul-de-sac fluid is likely reactive. 4. Constipation. 5. Stable borderline to slightly prominent mesenteric and right common iliac chain lymph nodes. 6. Slightly enlarged and steatotic liver. Electronically Signed   By: Francis Quam M.D.   On: 01/06/2024 02:19    ROS - all of the below systems have been reviewed with the patient and positives are indicated with bold text General: chills, fever or night sweats Eyes: blurry vision or double vision ENT: epistaxis or sore  throat Hematologic/Lymphatic: bleeding problems, blood clots or swollen lymph nodes Endocrine: temperature intolerance or unexpected weight changes Breast: new or changing breast lumps or nipple discharge Resp: cough, shortness of breath, or wheezing CV: chest pain or dyspnea on exertion GI: as per HPI GU: dysuria, trouble voiding, or hematuria Neuro: TIA or stroke symptoms    PE Blood pressure (!) 156/93, pulse (!) 109, temperature 97.9 F (36.6 C), temperature source Oral, resp. rate 13, height 5' 3 (1.6 m), weight 85.3 kg, SpO2 99%. Constitutional: NAD; conversant; no deformities Eyes: Moist conjunctiva; no lid lag; anicteric; PERRL Neck: Trachea midline; no thyromegaly Lungs: Normal respiratory effort CV: RRR GI: Abd soft, appropriately TTP MSK: Normal range of motion of extremities; no clubbing/cyanosis Psychiatric: Appropriate affect; alert and oriented x3  Results for orders placed or performed during the hospital encounter of 01/05/24 (from the past 48 hours)  Lipase, blood     Status: None   Collection Time: 01/05/24 10:36 PM  Result Value Ref Range   Lipase 24 11 - 51 U/L    Comment: Performed at [facility], [address], [place], [place] [address]  Comprehensive metabolic panel     Status: Abnormal   Collection Time: 01/05/24 10:36 PM  Result Value Ref Range   Sodium 131 (L) 135 - 145 mmol/L   Potassium 4.5 3.5 - 5.1 mmol/L   Chloride 102 98 - 111 mmol/L   CO2 9 (L) 22 - 32 mmol/L   Glucose, Bld 404 (H) 70 - 99 mg/dL    Comment: Glucose reference range applies only to samples taken after fasting for at least 8 hours.   BUN 9 6 - 20 mg/dL   Creatinine, Ser 9.10 0.44 - 1.00 mg/dL   Calcium  9.2 8.9 - 10.3 mg/dL   Total Protein 9.1 (H) 6.5 - 8.1 g/dL   Albumin 3.3 (L) 3.5 - 5.0 g/dL   AST <5 (L) 15 - 41 U/L   ALT 7 0 - 44 U/L   Alkaline Phosphatase 149 (H) 38 - 126 U/L   Total Bilirubin 1.4 (H) 0.0 - 1.2 mg/dL   GFR, Estimated >39 >39  mL/min    Comment: (NOTE) Calculated using the CKD-EPI Creatinine Equation (2021)    Anion gap 20 (H) 5 - 15    Comment: Performed at [facility], [address], [place], [place] [address]  CBC     Status: Abnormal   Collection Time: 01/05/24 10:36 PM  Result Value Ref Range   WBC 30.6 (H) 4.0 - 10.5 K/uL   RBC 5.03 3.87 - 5.11 MIL/uL   Hemoglobin 11.3 (L) 12.0 - 15.0 g/dL   HCT 59.7 63.9 - 53.9 %   MCV 79.9 (L) 80.0 - 100.0 fL   MCH 22.5 (L) 26.0 - 34.0 pg   MCHC 28.1 (L) 30.0 - 36.0 g/dL   RDW 82.1 (H) 88.4 - 84.4 %   Platelets 837 (H) 150 - 400 K/uL   nRBC 0.0 0.0 - 0.2 %    Comment: Performed at [facility], [address], [place], [place] [address]  hCG, serum, qualitative     Status: None   Collection Time: 01/05/24 10:36 PM  Result Value Ref Range   Preg, Serum NEGATIVE NEGATIVE    Comment:        THE SENSITIVITY OF THIS METHODOLOGY IS >10 mIU/mL. Performed at [facility], [address], [place], [place] [address]   Urinalysis, Routine w reflex microscopic -Urine, Clean Catch     Status: Abnormal   Collection Time: 01/05/24 10:55 PM  Result Value Ref Range   Color, Urine YELLOW YELLOW   APPearance CLEAR CLEAR   Specific Gravity, Urine 1.020 1.005 - 1.030   pH 5.0 5.0 - 8.0   Glucose, UA >=500 (A) NEGATIVE mg/dL   Hgb urine dipstick NEGATIVE NEGATIVE   Bilirubin Urine NEGATIVE NEGATIVE   Ketones, ur 80 (A) NEGATIVE mg/dL   Protein, ur 899 (A) NEGATIVE mg/dL   Nitrite NEGATIVE NEGATIVE   Leukocytes,Ua NEGATIVE NEGATIVE   RBC / HPF 0-5 0 - 5 RBC/hpf   WBC, UA 0-5 0 - 5 WBC/hpf   Bacteria, UA NONE SEEN NONE SEEN   Squamous Epithelial / HPF 0-5 0 - 5 /HPF    Comment: Performed at [facility], [address] [place], [place] [address]  I-Stat CG4 Lactic Acid     Status: None   Collection Time: 01/06/24 12:57 AM  Result Value Ref Range   Lactic Acid, Venous 1.8 0.5 - 1.9 mmol/L   Blood gas, venous (at [facility])     Status: Abnormal   Collection Time: 01/06/24  1:23 AM  Result Value Ref Range   pH, Ven 7.17 (LL) 7.25 - 7.43    Comment: CRITICAL RESULT CALLED TO, READ BACK BY AND VERIFIED WITH: RSABRA LIKE, RN [phone] 01/06/24 BY V. NICANOR    pCO2, Ven 27 (L) 44 - 60 mmHg   pO2, Ven 48 (H) 32 - 45 mmHg   Bicarbonate 9.8 (L) 20.0 - 28.0 mmol/L   Acid-base deficit 17.2 (H) 0.0 - 2.0 mmol/L   O2 Saturation 73.7 %   Patient temperature 37.0     Comment: Performed at [facility], [address], [place], [place] [address]  CBG monitoring, ED     Status: Abnormal   Collection Time: 01/06/24  2:39 AM  Result Value Ref Range   Glucose-Capillary 362 (H) 70 - 99 mg/dL    Comment: Glucose reference range applies only to samples taken after fasting for at least 8 hours.  Beta-hydroxybutyric acid  Status: Abnormal   Collection Time: 01/06/24  2:41 AM  Result Value Ref Range   Beta-Hydroxybutyric Acid >8.00 (H) 0.05 - 0.27 mmol/L    Comment: RESULT CONFIRMED BY MANUAL DILUTION Performed at [facility], [address], [place], [place] [address]   I-Stat CG4 Lactic Acid     Status: None   Collection Time: 01/06/24  2:50 AM  Result Value Ref Range   Lactic Acid, Venous 0.9 0.5 - 1.9 mmol/L  MRSA Next Gen by PCR, Nasal     Status: None   Collection Time: 01/06/24  3:39 AM   Specimen: Nasal Mucosa; Nasal Swab  Result Value Ref Range   MRSA by PCR Next Gen NOT DETECTED NOT DETECTED    Comment: (NOTE) The GeneXpert MRSA Assay (FDA approved for NASAL specimens only), is one component of a comprehensive MRSA colonization surveillance program. It is not intended to diagnose MRSA infection nor to guide or monitor treatment for MRSA infections. Test performance is not FDA approved in patients less than 34 years old. Performed at [facility], [address], [place], [place] [address]   CBG monitoring, ED     Status:  Abnormal   Collection Time: 01/06/24  3:40 AM  Result Value Ref Range   Glucose-Capillary 361 (H) 70 - 99 mg/dL    Comment: Glucose reference range applies only to samples taken after fasting for at least 8 hours.  Glucose, capillary     Status: Abnormal   Collection Time: 01/06/24  5:05 AM  Result Value Ref Range   Glucose-Capillary 253 (H) 70 - 99 mg/dL    Comment: Glucose reference range applies only to samples taken after fasting for at least 8 hours.  Beta-hydroxybutyric acid     Status: Abnormal   Collection Time: 01/06/24  5:32 AM  Result Value Ref Range   Beta-Hydroxybutyric Acid 2.77 (H) 0.05 - 0.27 mmol/L    Comment: Performed at [facility], [address] [place], [place] [address]  CBC with Differential/Platelet     Status: Abnormal   Collection Time: 01/06/24  5:32 AM  Result Value Ref Range   WBC 27.1 (H) 4.0 - 10.5 K/uL   RBC 4.43 3.87 - 5.11 MIL/uL   Hemoglobin 10.0 (L) 12.0 - 15.0 g/dL   HCT 64.8 (L) 63.9 - 53.9 %   MCV 79.2 (L) 80.0 - 100.0 fL   MCH 22.6 (L) 26.0 - 34.0 pg   MCHC 28.5 (L) 30.0 - 36.0 g/dL   RDW 82.5 (H) 88.4 - 84.4 %   Platelets 707 (H) 150 - 400 K/uL   nRBC 0.0 0.0 - 0.2 %   Neutrophils Relative % 88 %   Neutro Abs 23.8 (H) 1.7 - 7.7 K/uL   Lymphocytes Relative 6 %   Lymphs Abs 1.7 0.7 - 4.0 K/uL   Monocytes Relative 4 %   Monocytes Absolute 1.2 (H) 0.1 - 1.0 K/uL   Eosinophils Relative 0 %   Eosinophils Absolute 0.0 0.0 - 0.5 K/uL   Basophils Relative 0 %   Basophils Absolute 0.1 0.0 - 0.1 K/uL   Immature Granulocytes 2 %   Abs Immature Granulocytes 0.44 (H) 0.00 - 0.07 K/uL    Comment: Performed at [facility], [address] [place], [place] [address]  Comprehensive metabolic panel with GFR     Status: Abnormal   Collection Time: 01/06/24  5:32 AM  Result Value Ref Range   Sodium 135 135 -  145 mmol/L   Potassium 3.6 3.5 - 5.1 mmol/L   Chloride 106 98 - 111 mmol/L   CO2 16 (L) 22 - 32  mmol/L   Glucose, Bld 159 (H) 70 - 99 mg/dL    Comment: Glucose reference range applies only to samples taken after fasting for at least 8 hours.   BUN 7 6 - 20 mg/dL   Creatinine, Ser 9.27 0.44 - 1.00 mg/dL   Calcium  9.0 8.9 - 10.3 mg/dL   Total Protein 7.3 6.5 - 8.1 g/dL   Albumin 2.8 (L) 3.5 - 5.0 g/dL   AST 7 (L) 15 - 41 U/L   ALT 8 0 - 44 U/L   Alkaline Phosphatase 122 38 - 126 U/L   Total Bilirubin 1.0 0.0 - 1.2 mg/dL   GFR, Estimated >39 >39 mL/min    Comment: (NOTE) Calculated using the CKD-EPI Creatinine Equation (2021)    Anion gap 13 5 - 15    Comment: Performed at [facility], [address] [place], [place] [address]  Magnesium     Status: Abnormal   Collection Time: 01/06/24  5:32 AM  Result Value Ref Range   Magnesium 1.5 (L) 1.7 - 2.4 mg/dL    Comment: Performed at [facility], [address], [place], [place] [address]  Glucose, capillary     Status: Abnormal   Collection Time: 01/06/24  6:08 AM  Result Value Ref Range   Glucose-Capillary 161 (H) 70 - 99 mg/dL    Comment: Glucose reference range applies only to samples taken after fasting for at least 8 hours.  Glucose, capillary     Status: Abnormal   Collection Time: 01/06/24  7:05 AM  Result Value Ref Range   Glucose-Capillary 177 (H) 70 - 99 mg/dL    Comment: Glucose reference range applies only to samples taken after fasting for at least 8 hours.  Glucose, capillary     Status: Abnormal   Collection Time: 01/06/24  8:09 AM  Result Value Ref Range   Glucose-Capillary 162 (H) 70 - 99 mg/dL    Comment: Glucose reference range applies only to samples taken after fasting for at least 8 hours.    CT ABDOMEN PELVIS W CONTRAST Result Date: 01/06/2024 CLINICAL DATA:  Bowel obstruction suspected.  Sepsis. EXAM: CT ABDOMEN AND PELVIS WITH CONTRAST TECHNIQUE: Multidetector CT imaging of the abdomen and pelvis was performed using the standard protocol following bolus  administration of intravenous contrast. RADIATION DOSE REDUCTION: This exam was performed according to the departmental dose-optimization program which includes automated exposure control, adjustment of the mA and/or kV according to patient size and/or use of iterative reconstruction technique. CONTRAST:  OMNIPAQUE  IOHEXOL  300 MG/ML  SOLN COMPARISON:  CTs with IV contrast 12/12/2023, 11/15/2023 and CT pelvis with contrast 03/04/2022. Pelvic ultrasound 12/12/2023. FINDINGS: Lower chest: No abnormality. Hepatobiliary: 19 cm in length liver, slightly steatotic without mass. Unremarkable gallbladder and bile ducts. Pancreas: No abnormality. Spleen: No abnormality. Adrenals/Urinary Tract: No abnormality. Stomach/Bowel: Unremarkable contracted stomach. Normal caliber upper to mid abdominal small bowel. Unremarkable small bowel anastomosis anterior left lower abdominopelvic quadrant. Remote ileocecectomy with primary surgical anastomosis, again noted inflamed appearance in the distal ileum leading up to the anastomosis. There is interval development of an irregular rim enhancing hypodense collection in the upper pelvis just above the uterus and bladder in the midline measuring 5.5 x 5.2 x 6.8 cm partially abutting the inflamed neoterminal ileum. Its Hounsfield density is above that of simple  fluid ranging 41-47 which could indicate viscous abscess fluid, phlegmon or hematoma. There is no air in the collection. It is also possible this could communicate with 1 or more of the surrounding small bowel segments which it abuts. There are no dilated segments to suspect mechanical obstruction. The colon wall is normal thickness with mild fecal stasis. Vascular/Lymphatic: No significant vascular findings. Borderline to slightly prominent mesenteric lymph nodes are unchanged. There is a right common iliac chain node measuring 1 cm in short axis, unchanged. No new adenopathy. Reproductive: The the uterus is intact. Left ovary  unremarkable. Right ovary again noted enlarged and enhancing measuring 4.8 x 3.9 cm which could indicate secondary inflammation from adjacent inflamed bowel or infection including possible tubo-ovarian abscess. It is only slightly larger than 25 days ago. Other: Mild pelvic cul-de-sac fluid is likely reactive. No other free fluid is seen. No free air or abdominal wall hernia. Musculoskeletal: No acute or significant osseous findings. IMPRESSION: 1. 5.5 x 5.2 x 6.8 cm irregular rim enhancing hypodense collection in the upper pelvis just above the uterus and bladder in the midline partially abutting the inflamed neoterminal ileum. Its Hounsfield density is above that of simple fluid which could indicate viscous abscess fluid, phlegmon or hematoma. It is also possible this could communicate with 1 or more of the surrounding small bowel segments which it abuts. 2. Right ovary again noted enlarged and enhancing measuring 4.8 x 3.9 cm which could indicate secondary inflammation from adjacent inflamed bowel or infection including possible tubo-ovarian abscess. It is only slightly larger than 25 days ago. 3. Mild pelvic cul-de-sac fluid is likely reactive. 4. Constipation. 5. Stable borderline to slightly prominent mesenteric and right common iliac chain lymph nodes. 6. Slightly enlarged and steatotic liver. Electronically Signed   By: Francis Quam M.D.   On: 01/06/2024 02:19     A/P: Connie Williamson is an 28 y.o. female with Crohn's related abscess and DKA.  Operative management would not be recommended in this inflamed state.  Will consult IR for consideration of abscess drainage.  Cont IV antibiotics and NPO.  Further recs per GI.  Would be reasonable to transfer to [facility] for continuity of care, once she stabilizes.      [name], MD  Colorectal and General Surgery [facility]  high medical decision making.

## 2024-01-06 NOTE — Plan of Care (Signed)

## 2024-01-06 NOTE — Inpatient Diabetes Management (Signed)
 Inpatient Diabetes Program Recommendations  AACE/ADA: New Consensus Statement on Inpatient Glycemic Control (2015)  Target Ranges:  Prepandial:   less than 140 mg/dL      Peak postprandial:   less than 180 mg/dL (1-2 hours)      Critically ill patients:  140 - 180 mg/dL   Lab Results  Component Value Date   GLUCAP 152 (H) 01/06/2024   HGBA1C 10.9 (H) 11/17/2023    Review of Glycemic Control  Diabetes history: DM1 Outpatient Diabetes medications: Tresiba  40 daily Current orders for Inpatient glycemic control: IV insulin  per EndoTool for DKA  Pt has been on steroid (Pred dose pak)  Inpatient Diabetes Program Recommendations:    Ready to transition to SQ insulin  after procedure.  Semglee  30 units Q24H  Novolog  0-9 units Q4H x 12H, then TID with meals and 0-5 HS  Novolog  3 units TID with meals if eating > 50%.   Briefly spoke to pt at bedside regarding her diabetes and HgbA1C 10.9%. Confirmed pt is on Tresiba  40 units. States she's been on steroids (medrol  dose pak).   Would recommend Novolog  meal coverage at discharge, along with Tresiba . I think she will get better control at home with both Tresiba  and Novolog .   Continue to follow.  Thank you. Shona Brandy, RD, LDN, CDCES Inpatient Diabetes Coordinator [phone]

## 2024-01-06 NOTE — Progress Notes (Signed)
  IR BRIEF PROGRESS NOTE:  Patient was recommended for 900 mL of Omnipaque  oral contrast to better evaluate patient's operative window for pelvic fluid collection drain placement in IR, per Dr. Delinda request. Unfortunately, patient remains somnolent, and poorly tolerant of PO intake per RN, with nausea despite antiemetics, and vomiting.   As patient is unable to ingest the oral contrast, IR will defer proceeding at this time. We will re-attempt tomorrow, pending patient's ability to ingest the Omnipaque . If patient is able to finish drinking the contrast overnight, please have her sent to CT suite for CT Abdomen and Pelvis without contrast (ordered, pending).   Electronically Signed: Carlin DELENA Griffon, PA-C 01/06/2024, 3:34 PM

## 2024-01-06 NOTE — ED Notes (Signed)
 Patient transported to CT

## 2024-01-06 NOTE — Consult Note (Signed)
 [facility] Gastroenterology Consult  Referring Provider: No ref. provider found Primary Care Physician:  Paseda, Folashade R, FNP Primary Gastroenterologist: [facility] (Dr. Amelie)  Reason for Consultation: Crohn's disease  SUBJECTIVE:   HPI: Connie Williamson is a 28 y.o. female with past medical history significant for ileocolonic Crohn's disease diagnosed in 2017 complicated by adhesions requiring lysis of adhesions and small bowel resection in 2018. Previously on IFX, though noted that she has not been on biologic therapy for years. She was recently admitted to [facility] 11/2023 for Crohn's disease flare and was discharged to home on oral steroid therapy. She followed up with her primary gastroenterologist on 12/19/23 and was recommended to continue oral steroid therapy and have colonoscopy. Patient noted that she has outpatient colonoscopy scheduled for 01/08/24.   She began to have RLQ abdominal pain, nausea and non-bloody emesis which prompted her presentation to current admission. She noted last BM being 5 days prior with some bright red blood. No fevers or chills. Some chest discomfort with emesis, no shortness of breath. She has been on prednisone  10 mg PO daily prior to current admission.  CT imaging has shown likely intra-abdominal abscess, General Surgery with no surgical recommendations, IR evaluation pending.   Past Medical History:  Diagnosis Date   Asthma    Crohn's disease (HCC)    Diabetes mellitus    Intramural leiomyoma of uterus    Menorrhagia    Past Surgical History:  Procedure Laterality Date   ADENOIDECTOMY     BOWEL RESECTION     TONSILLECTOMY     Prior to Admission medications   Medication Sig Start Date End Date Taking? Authorizing Provider  FLUoxetine  (PROZAC ) 20 MG tablet Take 1 tablet (20 mg total) by mouth daily. 12/25/23  Yes Paseda, Folashade R, FNP  insulin  degludec (TRESIBA  FLEXTOUCH) 100 UNIT/ML FlexTouch Pen  Inject 44 Units into the skin at bedtime. Patient taking differently: Inject 40 Units into the skin at bedtime. 12/25/23  Yes Paseda, Folashade R, FNP  ondansetron  (ZOFRAN -ODT) 4 MG disintegrating tablet Take 1 tablet (4 mg total) by mouth every 8 (eight) hours as needed for nausea or vomiting. 12/12/23  Yes Dreama Longs, MD  polyethylene glycol powder (MIRALAX ) 17 GM/SCOOP powder Take 17 g by mouth 2 (two) times daily. Decrease to daily if you develop diarrhea. Patient taking differently: Take 17 g by mouth daily as needed for mild constipation or moderate constipation. 11/17/23  Yes Briana Elgin LABOR, MD  predniSONE  (DELTASONE ) 10 MG tablet Take 40mg  (4 tablets) a day for 1 week, 30mg  a day (3 tablets) for the second week, 20mg  (2 tablets) a day for the third week, 10mg  (1 tablet) a day for the fourth week. Patient taking differently: Take 10 mg by mouth daily. 12/12/23  Yes Dreama Longs, MD  Semaglutide ,0.25 or 0.5MG /DOS, 2 MG/3ML SOPN Inject 0.5 mg into the skin once a week. 12/25/23  Yes Paseda, Folashade R, FNP  amoxicillin-clavulanate (AUGMENTIN) 875-125 MG tablet Take 1 tablet by mouth 2 (two) times daily. Patient not taking: Reported on 01/06/2024 12/15/23   [provider]  Continuous Glucose Sensor (DEXCOM G7 SENSOR) MISC Please use as directed. 01/23/23   Paseda, Folashade R, FNP  fluconazole  (DIFLUCAN ) 200 MG tablet Take 1 tablet (200 mg total) by mouth daily as needed for up to 1 dose. Patient not taking: No sig reported 12/12/23   Cottie Donnice PARAS, MD  metFORMIN  (GLUCOPHAGE -XR) 500 MG 24 hr tablet Take 1 tablet (500mg )  by mouth daily with breakfast, after 1 week increase to 500 mg twice daily with food Patient not taking: Reported on 01/06/2024 12/25/23   Paseda, Folashade R, FNP  Na Sulfate-K Sulfate-Mg Sulfate concentrate (SUPREP) 17.5-3.13-1.6 GM/177ML SOLN Take 177 mLs by mouth. Patient not taking: Reported on 01/06/2024 12/19/23   [provider]  oxyCODONE  (ROXICODONE ) 5  MG immediate release tablet Take 1 tablet (5 mg total) by mouth every 4 (four) hours as needed for severe pain (pain score 7-10). Patient not taking: Reported on 01/06/2024 12/12/23   Dreama Longs, MD  rosuvastatin  (CRESTOR ) 5 MG tablet Take 1 tablet (5 mg total) by mouth daily. Patient not taking: Reported on 01/06/2024 12/27/23 12/26/24  Paseda, Folashade R, FNP   Current Facility-Administered Medications  Medication Dose Route Frequency Provider Last Rate Last Admin   acetaminophen  (TYLENOL ) tablet 650 mg  650 mg Oral Q6H PRN Howerter, Justin B, DO       Or   acetaminophen  (TYLENOL ) suppository 650 mg  650 mg Rectal Q6H PRN Howerter, Justin B, DO       [START ON 01/07/2024] cefTRIAXone  (ROCEPHIN ) 2 g in sodium chloride  0.9 % 100 mL IVPB  2 g Intravenous Q24H Howerter, Justin B, DO       Chlorhexidine Gluconate Cloth 2 % PADS 6 each  6 each Topical Daily Howerter, Justin B, DO   6 each at 01/06/24 0445   dextrose 5 % in lactated ringers  infusion   Intravenous Continuous Logan Ubaldo NOVAK, PA-C 125 mL/hr at 01/06/24 9385 New Bag at 01/06/24 0614   dextrose 50 % solution 0-50 mL  0-50 mL Intravenous PRN Logan Ubaldo B, PA-C       HYDROmorphone  (DILAUDID ) injection 0.5 mg  0.5 mg Intravenous Q2H PRN Howerter, Justin B, DO       insulin  regular, human (MYXREDLIN) 100 units/ 100 mL infusion   Intravenous Continuous Logan Ubaldo NOVAK, PA-C 7 mL/hr at 01/06/24 9191 7 Units/hr at 01/06/24 9191   iohexol  (OMNIPAQUE ) 9 MG/ML oral solution 500 mL  500 mL Oral Q1H Johann Sieving, MD   500 mL at 01/06/24 1002   lactated ringers  infusion   Intravenous Continuous Logan Ubaldo NOVAK, PA-C   Stopped at 01/06/24 9386   metroNIDAZOLE  (FLAGYL ) IVPB 500 mg  500 mg Intravenous Q12H Howerter, Justin B, DO       naloxone  (NARCAN ) injection 0.4 mg  0.4 mg Intravenous PRN Howerter, Justin B, DO       ondansetron  (ZOFRAN ) injection 4 mg  4 mg Intravenous Q6H PRN Howerter, Justin B, DO   4 mg at 01/06/24 0810   Oral care  mouth rinse  15 mL Mouth Rinse PRN Howerter, Justin B, DO       promethazine (PHENERGAN) 12.5 mg in sodium chloride  0.9 % 50 mL IVPB  12.5 mg Intravenous Q6H PRN Howerter, Justin B, DO       sodium chloride  flush (NS) 0.9 % injection 10-40 mL  10-40 mL Intracatheter PRN Jerral Meth, MD       Allergies as of 01/05/2024   (No Known Allergies)   Family History  Problem Relation Age of Onset   Diabetes Mother    Kidney failure Mother    Hypertension Mother    Healthy Father    Healthy Sister    Healthy Brother    Diabetes Maternal Grandmother    Hypertension Maternal Grandmother    Diabetes Maternal Grandfather    Hypertension Maternal Grandfather    Social History  Socioeconomic History   Marital status: Significant Other    Spouse name: Not on file   Number of children: Not on file   Years of education: Not on file   Highest education level: Associate degree: academic program  Occupational History   Not on file  Tobacco Use   Smoking status: Never   Smokeless tobacco: Never  Substance and Sexual Activity   Alcohol use: Not Currently    Comment: Socially   Drug use: No   Sexual activity: Yes    Birth control/protection: Pill  Other Topics Concern   Not on file  Social History Narrative   Lives with a room mate.   Social Drivers of Corporate investment banker Strain: Low Risk  (12/16/2023)   Received from [facility]   Overall Financial Resource Strain (CARDIA)    Difficulty of Paying Living Expenses: Not very hard  Food Insecurity: No Food Insecurity (01/06/2024)   Hunger Vital Sign    Worried About Running Out of Food in the Last Year: Never true    Ran Out of Food in the Last Year: Never true  Transportation Needs: No Transportation Needs (01/06/2024)   PRAPARE - Administrator, Civil Service (Medical): No    Lack of Transportation (Non-Medical): No  Recent Concern: Transportation Needs - Unmet Transportation Needs (12/16/2023)   Received from  [facility]   PRAPARE - Transportation    Lack of Transportation (Medical): Yes    Lack of Transportation (Non-Medical): Yes  Physical Activity: Sufficiently Active (12/16/2023)   Received from [facility]   Exercise Vital Sign    On average, how many days per week do you engage in moderate to strenuous exercise (like a brisk walk)?: 5 days    On average, how many minutes do you engage in exercise at this level?: 30 min  Recent Concern: Physical Activity - Insufficiently Active (12/05/2023)   Exercise Vital Sign    Days of Exercise per Week: 1 day    Minutes of Exercise per Session: 10 min  Stress: Stress Concern Present (12/16/2023)   Received from [facility] of Occupational Health - Occupational Stress Questionnaire    Feeling of Stress : To some extent  Social Connections: Socially Integrated (12/16/2023)   Received from [facility]   Social Network    How would you rate your social network (family, work, friends)?: Good participation with social networks  Intimate Partner Violence: Not At Risk (01/06/2024)   Humiliation, Afraid, Rape, and Kick questionnaire    Fear of Current or Ex-Partner: No    Emotionally Abused: No    Physically Abused: No    Sexually Abused: No   Review of Systems:  Review of Systems  Constitutional:  Negative for chills, fever and weight loss.  Respiratory:  Negative for shortness of breath.   Cardiovascular:  Negative for chest pain.  Gastrointestinal:  Positive for abdominal pain, blood in stool, nausea and vomiting.    OBJECTIVE:   Temp:  [97.9 F (36.6 C)-98.6 F (37 C)] 97.9 F (36.6 C) (06/30 0800) Pulse Rate:  [103-114] 109 (06/30 0820) Resp:  [13-30] 13 (06/30 0820) BP: (137-167)/(67-107) 156/93 (06/30 0820) SpO2:  [99 %-100 %] 99 % (06/30 0820) Weight:  [85.3 kg] 85.3 kg (06/30 0530) Last BM Date :  (PTA) Physical Exam Constitutional:      General: She is not in acute distress.    Appearance: She is not  ill-appearing, toxic-appearing or diaphoretic.  Cardiovascular:     Rate and Rhythm: Normal rate and regular rhythm.  Pulmonary:     Effort: No respiratory distress.     Breath sounds: Normal breath sounds.  Abdominal:     General: There is no distension.     Palpations: Abdomen is soft.     Tenderness: There is abdominal tenderness (RLQ). There is no guarding.   Musculoskeletal:     Right lower leg: No edema.     Left lower leg: No edema.   Skin:    General: Skin is warm and dry.   Neurological:     Mental Status: She is alert.     Labs: Recent Labs    01/05/24 2236 01/06/24 0532  WBC 30.6* 27.1*  HGB 11.3* 10.0*  HCT 40.2 35.1*  PLT 837* 707*   BMET Recent Labs    01/05/24 2236 01/06/24 0532  NA 131* 135  K 4.5 3.6  CL 102 106  CO2 9* 16*  GLUCOSE 404* 159*  BUN 9 7  CREATININE 0.89 0.72  CALCIUM  9.2 9.0   LFT Recent Labs    01/06/24 0532  PROT 7.3  ALBUMIN 2.8*  AST 7*  ALT 8  ALKPHOS 122  BILITOT 1.0   PT/INR No results for input(s): LABPROT, INR in the last 72 hours.  Diagnostic imaging: CT ABDOMEN PELVIS W CONTRAST Result Date: 01/06/2024 CLINICAL DATA:  Bowel obstruction suspected.  Sepsis. EXAM: CT ABDOMEN AND PELVIS WITH CONTRAST TECHNIQUE: Multidetector CT imaging of the abdomen and pelvis was performed using the standard protocol following bolus administration of intravenous contrast. RADIATION DOSE REDUCTION: This exam was performed according to the departmental dose-optimization program which includes automated exposure control, adjustment of the mA and/or kV according to patient size and/or use of iterative reconstruction technique. CONTRAST:  OMNIPAQUE  IOHEXOL  300 MG/ML  SOLN COMPARISON:  CTs with IV contrast 12/12/2023, 11/15/2023 and CT pelvis with contrast 03/04/2022. Pelvic ultrasound 12/12/2023. FINDINGS: Lower chest: No abnormality. Hepatobiliary: 19 cm in length liver, slightly steatotic without mass. Unremarkable  gallbladder and bile ducts. Pancreas: No abnormality. Spleen: No abnormality. Adrenals/Urinary Tract: No abnormality. Stomach/Bowel: Unremarkable contracted stomach. Normal caliber upper to mid abdominal small bowel. Unremarkable small bowel anastomosis anterior left lower abdominopelvic quadrant. Remote ileocecectomy with primary surgical anastomosis, again noted inflamed appearance in the distal ileum leading up to the anastomosis. There is interval development of an irregular rim enhancing hypodense collection in the upper pelvis just above the uterus and bladder in the midline measuring 5.5 x 5.2 x 6.8 cm partially abutting the inflamed neoterminal ileum. Its Hounsfield density is above that of simple fluid ranging 41-47 which could indicate viscous abscess fluid, phlegmon or hematoma. There is no air in the collection. It is also possible this could communicate with 1 or more of the surrounding small bowel segments which it abuts. There are no dilated segments to suspect mechanical obstruction. The colon wall is normal thickness with mild fecal stasis. Vascular/Lymphatic: No significant vascular findings. Borderline to slightly prominent mesenteric lymph nodes are unchanged. There is a right common iliac chain node measuring 1 cm in short axis, unchanged. No new adenopathy. Reproductive: The the uterus is intact. Left ovary unremarkable. Right ovary again noted enlarged and enhancing measuring 4.8 x 3.9 cm which could indicate secondary inflammation from adjacent inflamed bowel or infection including possible tubo-ovarian abscess. It is only slightly larger than 25 days ago. Other: Mild pelvic cul-de-sac fluid is likely reactive. No other free fluid is seen. No free  air or abdominal wall hernia. Musculoskeletal: No acute or significant osseous findings. IMPRESSION: 1. 5.5 x 5.2 x 6.8 cm irregular rim enhancing hypodense collection in the upper pelvis just above the uterus and bladder in the midline partially  abutting the inflamed neoterminal ileum. Its Hounsfield density is above that of simple fluid which could indicate viscous abscess fluid, phlegmon or hematoma. It is also possible this could communicate with 1 or more of the surrounding small bowel segments which it abuts. 2. Right ovary again noted enlarged and enhancing measuring 4.8 x 3.9 cm which could indicate secondary inflammation from adjacent inflamed bowel or infection including possible tubo-ovarian abscess. It is only slightly larger than 25 days ago. 3. Mild pelvic cul-de-sac fluid is likely reactive. 4. Constipation. 5. Stable borderline to slightly prominent mesenteric and right common iliac chain lymph nodes. 6. Slightly enlarged and steatotic liver. Electronically Signed   By: Francis Quam M.D.   On: 01/06/2024 02:19   IMPRESSION: Sepsis, intra-abdominal abscess as complication of below  Ileocolonic Crohn's disease   - Current CT imaging shows distal ileum inflammation to site of anastomosis - C/b adhesions requiring lysis of adhesions and small bowel resection in 2018 - IFX in distant past  - Current outpatient treatment includes oral prednisone  therapy - Fecal calprotectin 12/19/23 elevated at 5,100 Type 2 diabetes mellitus, DKA, anion gap closing Asthma  PLAN: - Appreciate General Surgery evaluation - Await Interventional Radiology recommendations - Hold on steroid therapy in setting of intra-abdominal abscess - Continue antibiotic therapy  - Pain control per primary team - DKA treatment per ICU - Will require outpatient follow up with primary GI through [facility] to coordinate further Crohn's disease treatment including initiation of biologic therapy once repeat imaging has confirmed resolution/improvement of abscess - [facility] will follow    LOS: 0 days   Estefana Keas, DO [facility] Gastroenterology

## 2024-01-06 NOTE — Progress Notes (Signed)
  Carryover admission to the Day Admitter.  I discussed this case with the EDP, Ubaldo High, PA.  Per these discussions:   This is a 28 year old female with history of poorly controlled diabetes, with most recent hemoglobin A1c 10.9% when checked in May 2025, Crohn's disease for which she follows with [facility], who is being admitted for DKA as well as pericolonic abscess versus phlegmon at presenting with lower abdominal discomfort associate with nausea/vomiting.   In the ED, the patient has been afebrile, mildly tachycardic, with rates in the low 100s, systolic blood pressures in the 140s to 150s.  Her labs were notable for CBG 404, with CMP demonstrating bicarbonate 9, anion gap 20.  VBG is reported to be consistent with metabolic acidemia, and beta-hydroxybutyrate Klacid level is currently pending.  She was started on insulin  drip via Endo tool/DKA protocol.   Additionally, for evaluation of her lower abdominal discomfort, with initial report that she has not had a bowel movement in 4 days, CT abdomen/pelvis was pursued and is reported to show evidence of pericolonic abscess versus phlegmon.   CBC was notable for white blood cell count 30,000.  Initial lactic acid 1.8, with repeat trending down to 0.9.  Blood cultures x 2 were collected and the patient was started on Rocephin  and IV Flagyl .  Regarding the CT finding of pericolonic abscess versus phlegmon, EDP has discussed case with on-call general surgery , Dr. Dasie, who conveys that general surgery will formally consult and see the patient in the morning, but does not feel that there is an indication for urgent surgical intervention at this time.   CT abdomen/pelvis also showed some evidence of right ovarian enlargement, felt the more likely reactive in the context of its proximity to the pericolonic abscess versus phlegmon.  Per my discussions with the EDP, the patient had negative wet prep and chlamydia testing earlier this month.  also,  in the context of a history of Crohn's disease, EDP has contacted on-call [facility] gastroenterology, Dr. Saintclair, to request consult as well.   I have placed an order for inpatient admission to stepdown unit for further evaluation management of the above.  I have placed some additional preliminary admit orders via the adult multi-morbid admission order set.  I have continued insulin  drip via Endo tool and DKA protocol with associated IV fluid orders.  I will continue to existing order for queue for beta-hydroxybutyrate acid.  I will keep the patient n.p.o., ordered every hour Accu-Cheks, and morning labs include CMP, CBC, magnesium level.  Additionally, have ordered every 4 hour BMP checks through 1700 today.  For her abdominal discomfort associated with evidence of pericolonic abscess versus phlegmon, ordered prn IV Dilaudid , and continued the Rocephin  and Flagyl  that was started in the ED.  Also ordered as needed IV Zofran  as well as as needed IV Phenergan for refractory nausea/vomiting.    Eva Pore, DO Hospitalist

## 2024-01-06 NOTE — ED Notes (Signed)
 Attempted IV x2 unsuccessful. IV consult placed. Delay to cultures/fluids/antibiotics due to such.

## 2024-01-06 NOTE — Progress Notes (Signed)
   01/06/24 1439  TOC Brief Assessment  Insurance and Status Reviewed (AETNA / AETNA NAP)  Patient has primary care physician Yes (Paseda, Folashade R, FNP)  Home environment has been reviewed Apartment  Prior level of function: Independent with ADL's  Prior/Current Home Services No current home services  Social Drivers of Health Review SDOH reviewed no interventions necessary  Readmission risk has been reviewed Yes  Transition of care needs no transition of care needs at this time

## 2024-01-06 NOTE — Consult Note (Signed)
 Chief Complaint: Patient was seen in consultation today for abdominopelvic fluid collection, with consideration for drainage.  Referring Provider(s): Ms. Almarie Pringle, PA-C; Dr.  Bernarda Ned, MD.  Supervising Physician: Johann Sieving  Patient Status: [facility] - In-pt  Patient is Full Code  History of Present Illness: Connie Williamson is a 28 y.o. female  with PMHx notable for chron's disease, DM, asthma, and uterine leiomyoma.  Per Dr. Ned' consult note today: Connie Williamson is an 28 y.o. female who is here for abd pain and DKA.  She has a h/o DM and crohn's disease for which she follows with [facility].  It appears she has been on a Prednisone  taper recently.  CT shows intra-abdominal abscess/phlegmon at site of previous ileocolonic anastomosis.  [...] Operative management would not be recommended in this inflamed state. Will consult IR for consideration of abscess drainage. Cont IV antibiotics and NPO. Further recs per GI. Would be reasonable to transfer to [facility] for continuity of care, once she stabilizes.   Interventional Radiology was requested for abdominopelvic fluid collection drainage. Request was reviewed and approved by Dr. Johann, contingent on confirmation of operative window with oral contrast. Patient is tentatively scheduled for same in IR today.   Patient is laying in bed, somnolent, calm. Mother is at bedside. Patient continues with abdominal pain, with notable nausea with vomiting. Patient is not tolerating oral contrast intake well. Patient denies any fevers, headache, chest pain, SOB, cough, or bleeding.     Past Medical History:  Diagnosis Date   Asthma    Crohn's disease (HCC)    Diabetes mellitus    Intramural leiomyoma of uterus    Menorrhagia     Past Surgical History:  Procedure Laterality Date   ADENOIDECTOMY     BOWEL RESECTION     TONSILLECTOMY      Allergies: Patient has no known allergies.  Medications: Prior to  Admission medications   Medication Sig Start Date End Date Taking? Authorizing Provider  FLUoxetine  (PROZAC ) 20 MG tablet Take 1 tablet (20 mg total) by mouth daily. 12/25/23  Yes Paseda, Folashade R, FNP  insulin  degludec (TRESIBA  FLEXTOUCH) 100 UNIT/ML FlexTouch Pen Inject 44 Units into the skin at bedtime. Patient taking differently: Inject 40 Units into the skin at bedtime. 12/25/23  Yes Paseda, Folashade R, FNP  ondansetron  (ZOFRAN -ODT) 4 MG disintegrating tablet Take 1 tablet (4 mg total) by mouth every 8 (eight) hours as needed for nausea or vomiting. 12/12/23  Yes Dreama Longs, MD  polyethylene glycol powder (MIRALAX ) 17 GM/SCOOP powder Take 17 g by mouth 2 (two) times daily. Decrease to daily if you develop diarrhea. Patient taking differently: Take 17 g by mouth daily as needed for mild constipation or moderate constipation. 11/17/23  Yes Briana Elgin LABOR, MD  predniSONE  (DELTASONE ) 10 MG tablet Take 40mg  (4 tablets) a day for 1 week, 30mg  a day (3 tablets) for the second week, 20mg  (2 tablets) a day for the third week, 10mg  (1 tablet) a day for the fourth week. Patient taking differently: Take 10 mg by mouth daily. 12/12/23  Yes Dreama Longs, MD  Semaglutide ,0.25 or 0.5MG /DOS, 2 MG/3ML SOPN Inject 0.5 mg into the skin once a week. 12/25/23  Yes Paseda, Folashade R, FNP  amoxicillin-clavulanate (AUGMENTIN) 875-125 MG tablet Take 1 tablet by mouth 2 (two) times daily. Patient not taking: Reported on 01/06/2024 12/15/23   [provider]  Continuous Glucose Sensor (DEXCOM G7 SENSOR) MISC Please use as directed. 01/23/23  Paseda, Folashade R, FNP  fluconazole  (DIFLUCAN ) 200 MG tablet Take 1 tablet (200 mg total) by mouth daily as needed for up to 1 dose. Patient not taking: No sig reported 12/12/23   Cottie Donnice PARAS, MD  metFORMIN  (GLUCOPHAGE -XR) 500 MG 24 hr tablet Take 1 tablet (500mg ) by mouth daily with breakfast, after 1 week increase to 500 mg twice daily with food Patient not  taking: Reported on 01/06/2024 12/25/23   Paseda, Folashade R, FNP  Na Sulfate-K Sulfate-Mg Sulfate concentrate (SUPREP) 17.5-3.13-1.6 GM/177ML SOLN Take 177 mLs by mouth. Patient not taking: Reported on 01/06/2024 12/19/23   [provider]  oxyCODONE  (ROXICODONE ) 5 MG immediate release tablet Take 1 tablet (5 mg total) by mouth every 4 (four) hours as needed for severe pain (pain score 7-10). Patient not taking: Reported on 01/06/2024 12/12/23   Dreama Longs, MD  rosuvastatin  (CRESTOR ) 5 MG tablet Take 1 tablet (5 mg total) by mouth daily. Patient not taking: Reported on 01/06/2024 12/27/23 12/26/24  Paseda, Folashade R, FNP     Family History  Problem Relation Age of Onset   Diabetes Mother    Kidney failure Mother    Hypertension Mother    Healthy Father    Healthy Sister    Healthy Brother    Diabetes Maternal Grandmother    Hypertension Maternal Grandmother    Diabetes Maternal Grandfather    Hypertension Maternal Grandfather     Social History   Socioeconomic History   Marital status: Significant Other    Spouse name: Not on file   Number of children: Not on file   Years of education: Not on file   Highest education level: Associate degree: academic program  Occupational History   Not on file  Tobacco Use   Smoking status: Never   Smokeless tobacco: Never  Substance and Sexual Activity   Alcohol use: Not Currently    Comment: Socially   Drug use: No   Sexual activity: Yes    Birth control/protection: Pill  Other Topics Concern   Not on file  Social History Narrative   Lives with a room mate.   Social Drivers of Corporate investment banker Strain: Low Risk  (12/16/2023)   Received from [facility]   Overall Financial Resource Strain (CARDIA)    Difficulty of Paying Living Expenses: Not very hard  Food Insecurity: No Food Insecurity (01/06/2024)   Hunger Vital Sign    Worried About Running Out of Food in the Last Year: Never true    Ran Out of Food in  the Last Year: Never true  Transportation Needs: No Transportation Needs (01/06/2024)   PRAPARE - Administrator, Civil Service (Medical): No    Lack of Transportation (Non-Medical): No  Recent Concern: Transportation Needs - Unmet Transportation Needs (12/16/2023)   Received from [facility]   PRAPARE - Transportation    Lack of Transportation (Medical): Yes    Lack of Transportation (Non-Medical): Yes  Physical Activity: Sufficiently Active (12/16/2023)   Received from [facility]   Exercise Vital Sign    On average, how many days per week do you engage in moderate to strenuous exercise (like a brisk walk)?: 5 days    On average, how many minutes do you engage in exercise at this level?: 30 min  Recent Concern: Physical Activity - Insufficiently Active (12/05/2023)   Exercise Vital Sign    Days of Exercise per Week: 1 day    Minutes of Exercise per Session:  10 min  Stress: Stress Concern Present (12/16/2023)   Received from [facility] - Occupational Stress Questionnaire    Feeling of Stress : To some extent  Social Connections: Socially Integrated (12/16/2023)   Received from [facility]   Social Network    How would you rate your social network (family, work, friends)?: Good participation with social networks     Review of Systems: A 12 point ROS discussed and pertinent positives are indicated in the HPI above.  All other systems are negative.  Vital Signs: BP (!) 156/93 (BP Location: Right Arm)   Pulse (!) 109   Temp 97.9 F (36.6 C) (Oral)   Resp 13   Ht 5' 3 (1.6 m)   Wt 188 lb 0.8 oz (85.3 kg)   SpO2 99%   BMI 33.31 kg/m   Advance Care Plan: The advanced care place/surrogate decision maker was discussed at the time of visit and the patient did not wish to discuss or was not able to name a surrogate decision maker or provide an advance care plan.  Physical Exam Constitutional:      General: She is not in acute  distress.    Appearance: Normal appearance.     Comments: Patient with nausea. She is somnolent.  HENT:     Mouth/Throat:     Mouth: Mucous membranes are moist.     Pharynx: Oropharynx is clear.   Cardiovascular:     Rate and Rhythm: Regular rhythm. Tachycardia present.     Heart sounds: Normal heart sounds. No murmur heard. Pulmonary:     Effort: Pulmonary effort is normal.     Breath sounds: Normal breath sounds. No wheezing.  Abdominal:     General: Abdomen is flat.     Tenderness: There is abdominal tenderness in the suprapubic area and left lower quadrant.     Comments: LLQ and midline lower abdominal/ suprapubic tenderness to palpation.   Musculoskeletal:        General: Normal range of motion.   Skin:    General: Skin is warm and dry.   Neurological:     Mental Status: She is alert and oriented to person, place, and time.   Psychiatric:        Mood and Affect: Mood normal.        Behavior: Behavior normal.        Thought Content: Thought content normal.        Judgment: Judgment normal.     Imaging: CT ABDOMEN PELVIS W CONTRAST Result Date: 01/06/2024 CLINICAL DATA:  Bowel obstruction suspected.  Sepsis. EXAM: CT ABDOMEN AND PELVIS WITH CONTRAST TECHNIQUE: Multidetector CT imaging of the abdomen and pelvis was performed using the standard protocol following bolus administration of intravenous contrast. RADIATION DOSE REDUCTION: This exam was performed according to the departmental dose-optimization program which includes automated exposure control, adjustment of the mA and/or kV according to patient size and/or use of iterative reconstruction technique. CONTRAST:  OMNIPAQUE  IOHEXOL  300 MG/ML  SOLN COMPARISON:  CTs with IV contrast 12/12/2023, 11/15/2023 and CT pelvis with contrast 03/04/2022. Pelvic ultrasound 12/12/2023. FINDINGS: Lower chest: No abnormality. Hepatobiliary: 19 cm in length liver, slightly steatotic without mass. Unremarkable gallbladder and bile  ducts. Pancreas: No abnormality. Spleen: No abnormality. Adrenals/Urinary Tract: No abnormality. Stomach/Bowel: Unremarkable contracted stomach. Normal caliber upper to mid abdominal small bowel. Unremarkable small bowel anastomosis anterior left lower abdominopelvic quadrant. Remote ileocecectomy with primary surgical anastomosis, again noted inflamed appearance  in the distal ileum leading up to the anastomosis. There is interval development of an irregular rim enhancing hypodense collection in the upper pelvis just above the uterus and bladder in the midline measuring 5.5 x 5.2 x 6.8 cm partially abutting the inflamed neoterminal ileum. Its Hounsfield density is above that of simple fluid ranging 41-47 which could indicate viscous abscess fluid, phlegmon or hematoma. There is no air in the collection. It is also possible this could communicate with 1 or more of the surrounding small bowel segments which it abuts. There are no dilated segments to suspect mechanical obstruction. The colon wall is normal thickness with mild fecal stasis. Vascular/Lymphatic: No significant vascular findings. Borderline to slightly prominent mesenteric lymph nodes are unchanged. There is a right common iliac chain node measuring 1 cm in short axis, unchanged. No new adenopathy. Reproductive: The the uterus is intact. Left ovary unremarkable. Right ovary again noted enlarged and enhancing measuring 4.8 x 3.9 cm which could indicate secondary inflammation from adjacent inflamed bowel or infection including possible tubo-ovarian abscess. It is only slightly larger than 25 days ago. Other: Mild pelvic cul-de-sac fluid is likely reactive. No other free fluid is seen. No free air or abdominal wall hernia. Musculoskeletal: No acute or significant osseous findings. IMPRESSION: 1. 5.5 x 5.2 x 6.8 cm irregular rim enhancing hypodense collection in the upper pelvis just above the uterus and bladder in the midline partially abutting the inflamed  neoterminal ileum. Its Hounsfield density is above that of simple fluid which could indicate viscous abscess fluid, phlegmon or hematoma. It is also possible this could communicate with 1 or more of the surrounding small bowel segments which it abuts. 2. Right ovary again noted enlarged and enhancing measuring 4.8 x 3.9 cm which could indicate secondary inflammation from adjacent inflamed bowel or infection including possible tubo-ovarian abscess. It is only slightly larger than 25 days ago. 3. Mild pelvic cul-de-sac fluid is likely reactive. 4. Constipation. 5. Stable borderline to slightly prominent mesenteric and right common iliac chain lymph nodes. 6. Slightly enlarged and steatotic liver. Electronically Signed   By: Francis Quam M.D.   On: 01/06/2024 02:19   US  PELVIC COMPLETE W TRANSVAGINAL AND TORSION R/O Result Date: 12/12/2023 CLINICAL DATA:  Four day history of right-sided pelvic pain EXAM: TRANSABDOMINAL AND TRANSVAGINAL ULTRASOUND OF PELVIS DOPPLER ULTRASOUND OF OVARIES TECHNIQUE: Both transabdominal and transvaginal ultrasound examinations of the pelvis were performed. Transabdominal technique was performed for global imaging of the pelvis including uterus, ovaries, adnexal regions, and pelvic cul-de-sac. It was necessary to proceed with endovaginal exam following the transabdominal exam to visualize the pelvic structures. Color and duplex Doppler ultrasound was utilized to evaluate blood flow to the ovaries. COMPARISON:  Same day CT abdomen and pelvis FINDINGS: Uterus Measurements: 8.6 cm in sagittal dimension. No fibroids or other mass visualized. Endometrium Thickness: 5 mm.  No focal abnormality visualized. Right ovary Measurements: 6.4 x 5.0 x 4.0 cm = volume: 67.0 mL. The right ovary is nearly entirely replaced by a complex cystic structure with thick internal septations. Left ovary Measurements: 3.8 x 3.3 x 1.9 cm = volume: 12.5 mL. Normal appearance. No adnexal mass. Pulsed Doppler  evaluation of both ovaries demonstrates normal low-resistance arterial and venous waveforms. Other findings No abnormal free fluid. IMPRESSION: 1. No evidence of ovarian torsion. 2. The right ovary is nearly entirely replaced by a complex cystic structure with thick internal septations, which may represent hemorrhagic cyst, tubo-ovarian abscess, or epithelial ovarian neoplasm. Recommend follow-up pelvic  ultrasound examination in 6 weeks to ensure resolution. Alternatively, pelvic MRI. Electronically Signed   By: Limin  Xu M.D.   On: 12/12/2023 15:49   CT ABDOMEN PELVIS W CONTRAST Result Date: 12/12/2023 CLINICAL DATA:  RLQ abdominal pain - eval for appendicitis - recent treatment for ileitis. EXAM: CT ABDOMEN AND PELVIS WITH CONTRAST TECHNIQUE: Multidetector CT imaging of the abdomen and pelvis was performed using the standard protocol following bolus administration of intravenous contrast. RADIATION DOSE REDUCTION: This exam was performed according to the departmental dose-optimization program which includes automated exposure control, adjustment of the mA and/or kV according to patient size and/or use of iterative reconstruction technique. CONTRAST:  OMNIPAQUE  IOHEXOL  300 MG/ML  SOLN COMPARISON:  CT scan abdomen and pelvis from 11/15/2023. FINDINGS: Lower chest: The lung bases are clear. No pleural effusion. The heart is normal in size. No pericardial effusion. Hepatobiliary: The liver is normal in size. Non-cirrhotic configuration. No suspicious mass. No intrahepatic or extrahepatic bile duct dilation. No calcified gallstones. Normal gallbladder wall thickness. No pericholecystic inflammatory changes. Pancreas: Unremarkable. No pancreatic ductal dilatation or surrounding inflammatory changes. Spleen: Within normal limits. No focal lesion. Adrenals/Urinary Tract: Adrenal glands are unremarkable. No suspicious renal mass. No hydronephrosis. No renal or ureteric calculi. Urinary bladder is under distended,  precluding optimal assessment. However, no large mass or stones identified. No perivesical fat stranding. Stomach/Bowel: No disproportionate dilation of the small or large bowel loops. Patient is status post partial right hemicolectomy with ileocolonic anastomosis in the right lower quadrant. Appendix is surgically absent. Redemonstration of an approximately 10-12 cm long segment of neoterminal ileum exhibiting mild-to-moderate circumferential wall thickening, mucosal hyperattenuation and surrounding fat stranding, favoring active inflammatory bowel disease of neo terminal ileum, highly concerning for Crohn's disease. There is surrounding fat stranding and trace amount of fluid. No proximal bowel dilation. No other affected bowel loop segments seen. Vascular/Lymphatic: There is trace amount of fluid surrounding the neo terminal ileum, likely reactive, as discussed above. No walled-off abscess or loculated collection. No pneumoperitoneum. There are several prominent right lower quadrant mesenteric lymph nodes, favored benign/reactive. No aneurysmal dilation of the major abdominal arteries. Reproductive: Reproductive organs are not well evaluated on the CT scan exam. However, having said that a normal-sized anteverted uterus is noted. There is in slightly hyperattenuating walled right ovary measuring 3.6 x 4.1 cm, which appears grossly similar to the prior study and underlying secondary inflammation of the ovary is not excluded considering the proximity to the inflamed neo terminal ileum. No left adnexal mass seen. Other: Midline surgical scar noted. The soft tissues and abdominal wall are otherwise unremarkable. Musculoskeletal: No suspicious osseous lesions. IMPRESSION: 1. Redemonstration of approximately 10-12 cm long segment of neoterminal ileum exhibiting mild-to-moderate circumferential wall thickening, mucosal hyperattenuation and surrounding fat stranding, favoring active inflammatory bowel disease, highly  concerning for Crohn's disease. There is surrounding trace amount of fluid. No walled-off abscess or loculated collection. No pneumoperitoneum. 2. There is slightly hyperattenuating walled right ovary measuring 3.6 x 4.1 cm, which appears grossly similar to the prior study and underlying secondary inflammation of the ovary is not excluded considering the proximity to the inflamed neo terminal ileum. 3. Multiple other nonacute observations, as described above. Electronically Signed   By: Ree Molt M.D.   On: 12/12/2023 13:40    Labs:  CBC: Recent Labs    12/12/23 1119 12/25/23 1049 01/05/24 2236 01/06/24 0532  WBC 13.5* 14.8* 30.6* 27.1*  HGB 9.3* 10.3* 11.3* 10.0*  HCT 30.6* 36.6 40.2 35.1*  PLT 447* 672* 837* 707*    COAGS: No results for input(s): INR, APTT in the last 8760 hours.  BMP: Recent Labs    12/12/23 1119 12/25/23 1049 01/05/24 2236 01/06/24 0532 01/06/24 1006  NA 137 138 131* 135 135  K 3.4* 4.1 4.5 3.6 3.4*  CL 102 98 102 106 104  CO2 23 21 9* 16* 21*  GLUCOSE 98 150* 404* 159* 188*  BUN 11 11 9 7 6   CALCIUM  8.8* 9.6 9.2 9.0 8.9  CREATININE 0.69 0.73 0.89 0.72 0.47  GFRNONAA >60  --  >60 >60 >60    LIVER FUNCTION TESTS: Recent Labs    11/17/23 0602 12/12/23 1119 01/05/24 2236 01/06/24 0532  BILITOT 0.7 <0.2 1.4* 1.0  AST 5* 6* <5* 7*  ALT 8 5 7 8   ALKPHOS 80 99 149* 122  PROT 6.9 6.9 9.1* 7.3  ALBUMIN 2.7* 3.5 3.3* 2.8*    TUMOR MARKERS: No results for input(s): AFPTM, CEA, CA199, CHROMGRNA in the last 8760 hours.  Assessment and Plan: Per Dr. Debby' consult note today: Connie Williamson is an 28 y.o. female who is here for abd pain and DKA.  She has a h/o DM and crohn's disease for which she follows with [facility].  It appears she has been on a Prednisone  taper recently.  CT shows intra-abdominal abscess/phlegmon at site of previous ileocolonic anastomosis.  [...] Operative management would not be recommended in this  inflamed state. Will consult IR for consideration of abscess drainage. Cont IV antibiotics and NPO. Further recs per GI. Would be reasonable to transfer to [facility] for continuity of care, once she stabilizes.  Patient will present for tentatively scheduled abdominal fluid collection drain in IR today.   Per Dr. Johann, oral contrast will be needed to clarify the operative window for drain placement. Patient was initiated on oral contrast with Omnipaque  at 10 am, but has not been able to tolerate intake well due to nausea and vomiting. IR will scan patient once in IR suite. If contrast is inadequate, IR will have to defer until patient can tolerate contrast intake.   Patient has been NPO since approximately 3:30 am today, with exception of oral contrast.  All labs and medications are within acceptable parameters.  No pertinent allergies.    Risks and benefits discussed with the patient including bleeding, infection, damage to adjacent structures, bowel perforation/fistula connection, and sepsis.  All of the patient's questions were answered, patient is agreeable to proceed. Consent signed and in chart.     Thank you for allowing our service to participate in Connie Williamson 's care.  Electronically Signed: Carlin DELENA Griffon, PA-C   01/06/2024, 12:00 PM      I spent a total of 30 Minutes in face to face in clinical consultation, greater than 50% of which was counseling/coordinating care for abdominopelvic fluid collection, with consideration for drainage.

## 2024-01-06 NOTE — ED Provider Notes (Signed)
  EMERGENCY DEPARTMENT AT [facility] Provider Note   CSN: [id number] Arrival date & time: 01/05/24  2206     Patient presents with: Abdominal Pain   Connie Williamson is a 28 y.o. female.  Patient presents to the emergency department complaining of lower abdominal pain with constipation, nausea, vomiting.  Patient has a history of uncontrolled type II DM, hypertension, Crohn's disease.  She states that a month ago she was treated and admitted for ileitis.  This week she noted her last bowel movement to be at least 4 days ago.  She states that yesterday (approximately a day and a half ago) she began to have severe lower abdominal pain.  Upon arrival at the emergency department this evening she also vomited 2 times and this is the first time that she has vomited.  She does endorse nausea for the past few days.  She does deny fever, chills.  She denies flatus.  Patient denies chest pain, shortness of breath.  Upon arrival the patient was noted to be tachycardic.  At the time of my assessment labs and started to result in the patient was noted to have a leukocytosis with a white count of over 30,000 and a code sepsis was activated.    Abdominal Pain      Prior to Admission medications   Medication Sig Start Date End Date Taking? Authorizing Provider  Continuous Glucose Sensor (DEXCOM G7 SENSOR) MISC Please use as directed. 01/23/23   Paseda, Folashade R, FNP  fluconazole  (DIFLUCAN ) 200 MG tablet Take 1 tablet (200 mg total) by mouth daily as needed for up to 1 dose. Patient not taking: Reported on 12/25/2023 12/12/23   Cottie Donnice PARAS, MD  FLUoxetine  (PROZAC ) 20 MG tablet Take 1 tablet (20 mg total) by mouth daily. 12/25/23   Paseda, Folashade R, FNP  insulin  degludec (TRESIBA  FLEXTOUCH) 100 UNIT/ML FlexTouch Pen Inject 44 Units into the skin at bedtime. 12/25/23   Paseda, Folashade R, FNP  metFORMIN  (GLUCOPHAGE -XR) 500 MG 24 hr tablet Take 1 tablet (500mg ) by mouth daily  with breakfast, after 1 week increase to 500 mg twice daily with food 12/25/23   Paseda, Folashade R, FNP  ondansetron  (ZOFRAN -ODT) 4 MG disintegrating tablet Take 1 tablet (4 mg total) by mouth every 8 (eight) hours as needed for nausea or vomiting. 12/12/23   Dreama Longs, MD  oxyCODONE  (ROXICODONE ) 5 MG immediate release tablet Take 1 tablet (5 mg total) by mouth every 4 (four) hours as needed for severe pain (pain score 7-10). 12/12/23   Dreama Longs, MD  polyethylene glycol powder (MIRALAX ) 17 GM/SCOOP powder Take 17 g by mouth 2 (two) times daily. Decrease to daily if you develop diarrhea. 11/17/23   Briana Elgin LABOR, MD  predniSONE  (DELTASONE ) 10 MG tablet Take 40mg  (4 tablets) a day for 1 week, 30mg  a day (3 tablets) for the second week, 20mg  (2 tablets) a day for the third week, 10mg  (1 tablet) a day for the fourth week. 12/12/23   Dreama Longs, MD  rosuvastatin  (CRESTOR ) 5 MG tablet Take 1 tablet (5 mg total) by mouth daily. 12/27/23 12/26/24  Paseda, Folashade R, FNP  Semaglutide , 1 MG/DOSE, 4 MG/3ML SOPN Inject 1 mg as directed once a week. 01/22/24   Paseda, Folashade R, FNP  Semaglutide ,0.25 or 0.5MG /DOS, 2 MG/3ML SOPN Inject 0.5 mg into the skin once a week. 12/25/23   Paseda, Folashade R, FNP    Allergies: Patient has no known allergies.    Review  of Systems  Gastrointestinal:  Positive for abdominal pain.    Updated Vital Signs BP (!) 142/88   Pulse (!) 104   Temp 98.3 F (36.8 C) (Oral)   Resp (!) 30   SpO2 100%   Physical Exam Vitals and nursing note reviewed.  Constitutional:      General: She is not in acute distress.    Appearance: She is well-developed.  HENT:     Head: Normocephalic and atraumatic.   Eyes:     Conjunctiva/sclera: Conjunctivae normal.    Cardiovascular:     Rate and Rhythm: Regular rhythm. Tachycardia present.  Pulmonary:     Effort: Pulmonary effort is normal. No respiratory distress.     Breath sounds: Normal breath sounds.  Abdominal:      Palpations: Abdomen is soft.     Tenderness: There is abdominal tenderness in the right lower quadrant, suprapubic area and left lower quadrant.   Musculoskeletal:        General: No swelling.     Cervical back: Neck supple.   Skin:    General: Skin is warm and dry.     Capillary Refill: Capillary refill takes less than 2 seconds.   Neurological:     Mental Status: She is alert.   Psychiatric:        Mood and Affect: Mood normal.     (all labs ordered are listed, but only abnormal results are displayed) Labs Reviewed  COMPREHENSIVE METABOLIC PANEL WITH GFR - Abnormal; Notable for the following components:      Result Value   Sodium 131 (*)    CO2 9 (*)    Glucose, Bld 404 (*)    Total Protein 9.1 (*)    Albumin 3.3 (*)    AST <5 (*)    Alkaline Phosphatase 149 (*)    Total Bilirubin 1.4 (*)    Anion gap 20 (*)    All other components within normal limits  CBC - Abnormal; Notable for the following components:   WBC 30.6 (*)    Hemoglobin 11.3 (*)    MCV 79.9 (*)    MCH 22.5 (*)    MCHC 28.1 (*)    RDW 17.8 (*)    Platelets 837 (*)    All other components within normal limits  URINALYSIS, ROUTINE W REFLEX MICROSCOPIC - Abnormal; Notable for the following components:   Glucose, UA >=500 (*)    Ketones, ur 80 (*)    Protein, ur 100 (*)    All other components within normal limits  BLOOD GAS, VENOUS - Abnormal; Notable for the following components:   pH, Ven 7.17 (*)    pCO2, Ven 27 (*)    pO2, Ven 48 (*)    Bicarbonate 9.8 (*)    Acid-base deficit 17.2 (*)    All other components within normal limits  CULTURE, BLOOD (SINGLE)  CULTURE, BLOOD (ROUTINE X 2)  CULTURE, BLOOD (ROUTINE X 2)  LIPASE, BLOOD  HCG, SERUM, QUALITATIVE  BETA-HYDROXYBUTYRIC ACID  BETA-HYDROXYBUTYRIC ACID  BETA-HYDROXYBUTYRIC ACID  BETA-HYDROXYBUTYRIC ACID  BETA-HYDROXYBUTYRIC ACID  I-STAT CG4 LACTIC ACID, ED  I-STAT VENOUS BLOOD GAS, ED  I-STAT CG4 LACTIC ACID, ED  CBG  MONITORING, ED    EKG: None  Radiology: CT ABDOMEN PELVIS W CONTRAST Result Date: 01/06/2024 CLINICAL DATA:  Bowel obstruction suspected.  Sepsis. EXAM: CT ABDOMEN AND PELVIS WITH CONTRAST TECHNIQUE: Multidetector CT imaging of the abdomen and pelvis was performed using the standard protocol following bolus administration of  intravenous contrast. RADIATION DOSE REDUCTION: This exam was performed according to the departmental dose-optimization program which includes automated exposure control, adjustment of the mA and/or kV according to patient size and/or use of iterative reconstruction technique. CONTRAST:  OMNIPAQUE  IOHEXOL  300 MG/ML  SOLN COMPARISON:  CTs with IV contrast 12/12/2023, 11/15/2023 and CT pelvis with contrast 03/04/2022. Pelvic ultrasound 12/12/2023. FINDINGS: Lower chest: No abnormality. Hepatobiliary: 19 cm in length liver, slightly steatotic without mass. Unremarkable gallbladder and bile ducts. Pancreas: No abnormality. Spleen: No abnormality. Adrenals/Urinary Tract: No abnormality. Stomach/Bowel: Unremarkable contracted stomach. Normal caliber upper to mid abdominal small bowel. Unremarkable small bowel anastomosis anterior left lower abdominopelvic quadrant. Remote ileocecectomy with primary surgical anastomosis, again noted inflamed appearance in the distal ileum leading up to the anastomosis. There is interval development of an irregular rim enhancing hypodense collection in the upper pelvis just above the uterus and bladder in the midline measuring 5.5 x 5.2 x 6.8 cm partially abutting the inflamed neoterminal ileum. Its Hounsfield density is above that of simple fluid ranging 41-47 which could indicate viscous abscess fluid, phlegmon or hematoma. There is no air in the collection. It is also possible this could communicate with 1 or more of the surrounding small bowel segments which it abuts. There are no dilated segments to suspect mechanical obstruction. The colon wall is  normal thickness with mild fecal stasis. Vascular/Lymphatic: No significant vascular findings. Borderline to slightly prominent mesenteric lymph nodes are unchanged. There is a right common iliac chain node measuring 1 cm in short axis, unchanged. No new adenopathy. Reproductive: The the uterus is intact. Left ovary unremarkable. Right ovary again noted enlarged and enhancing measuring 4.8 x 3.9 cm which could indicate secondary inflammation from adjacent inflamed bowel or infection including possible tubo-ovarian abscess. It is only slightly larger than 25 days ago. Other: Mild pelvic cul-de-sac fluid is likely reactive. No other free fluid is seen. No free air or abdominal wall hernia. Musculoskeletal: No acute or significant osseous findings. IMPRESSION: 1. 5.5 x 5.2 x 6.8 cm irregular rim enhancing hypodense collection in the upper pelvis just above the uterus and bladder in the midline partially abutting the inflamed neoterminal ileum. Its Hounsfield density is above that of simple fluid which could indicate viscous abscess fluid, phlegmon or hematoma. It is also possible this could communicate with 1 or more of the surrounding small bowel segments which it abuts. 2. Right ovary again noted enlarged and enhancing measuring 4.8 x 3.9 cm which could indicate secondary inflammation from adjacent inflamed bowel or infection including possible tubo-ovarian abscess. It is only slightly larger than 25 days ago. 3. Mild pelvic cul-de-sac fluid is likely reactive. 4. Constipation. 5. Stable borderline to slightly prominent mesenteric and right common iliac chain lymph nodes. 6. Slightly enlarged and steatotic liver. Electronically Signed   By: Francis Quam M.D.   On: 01/06/2024 02:19     .Critical Care  Performed by: Logan Ubaldo NOVAK, PA-C Authorized by: Logan Ubaldo NOVAK, PA-C   Critical care provider statement:    Critical care time (minutes):  80   Critical care time was exclusive of:  Separately  billable procedures and treating other patients   Critical care was necessary to treat or prevent imminent or life-threatening deterioration of the following conditions:  Sepsis and endocrine crisis   Critical care was time spent personally by me on the following activities:  Development of treatment plan with patient or surrogate, discussions with consultants, evaluation of patient's response to treatment, examination of  patient, ordering and review of laboratory studies, ordering and review of radiographic studies, ordering and performing treatments and interventions, pulse oximetry, re-evaluation of patient's condition and review of old charts   Care discussed with: admitting provider      Medications Ordered in the ED  lactated ringers  infusion (has no administration in time range)  metroNIDAZOLE  (FLAGYL ) IVPB 500 mg (500 mg Intravenous New Bag/Given 01/06/24 0147)  sodium chloride  flush (NS) 0.9 % injection 10-40 mL (has no administration in time range)  promethazine (PHENERGAN) 12.5 mg in sodium chloride  0.9 % 50 mL IVPB (0 mg Intravenous Stopped 01/06/24 0233)  lactated ringers  bolus 1,760 mL (has no administration in time range)  insulin  regular, human (MYXREDLIN) 100 units/ 100 mL infusion (has no administration in time range)  lactated ringers  infusion (has no administration in time range)  dextrose 5 % in lactated ringers  infusion (has no administration in time range)  dextrose 50 % solution 0-50 mL (has no administration in time range)  potassium chloride 10 mEq in 100 mL IVPB (has no administration in time range)  lactated ringers  bolus 1,000 mL (0 mLs Intravenous Stopped 01/06/24 0211)  cefTRIAXone  (ROCEPHIN ) 2 g in sodium chloride  0.9 % 100 mL IVPB (0 g Intravenous Stopped 01/06/24 0148)  morphine  (PF) 4 MG/ML injection 4 mg (4 mg Intravenous Given 01/06/24 0044)  ondansetron  (ZOFRAN ) injection 4 mg (4 mg Intravenous Given 01/06/24 0044)  iohexol  (OMNIPAQUE ) 300 MG/ML solution 100 mL  (100 mLs Intravenous Contrast Given 01/06/24 0124)    Clinical Course as of 01/06/24 0240  Mon Jan 06, 2024  0023 Abdominal pain and sepsis [CC]  0224 DKA from infection Chron's vs TOA.  Sex history pending Surgery first then OB. [CC]    Clinical Course User Index [CC] Jerral Meth, MD                                 Medical Decision Making Amount and/or Complexity of Data Reviewed Labs: ordered. Radiology: ordered.  Risk Prescription drug management.   This patient presents to the ED for concern of abdominal pain with nausea and vomiting, this involves an extensive number of treatment options, and is a complaint that carries with it a high risk of complications and morbidity.  The differential diagnosis includes SBO, Crohn's flare, colitis, sepsis, appendicitis, cholecystitis, others   Co morbidities / Chronic conditions that complicate the patient evaluation  Crohn's disease, uncontrolled type II DM   Additional history obtained:  Additional history obtained from EMR External records from outside source obtained and reviewed including recent discharge summary   Lab Tests:  I Ordered, and personally interpreted labs.  The pertinent results include: White count of 30,600, negative pregnancy test, lactic acid 1.8, glucose 404 with a CO2 of 9 and an anion gap of 20 concerning for possible DKA, pH 7.17 showing DKA, UA with ketones   Imaging Studies ordered:  I ordered imaging studies including CT abdomen pelvis with contrast I independently visualized and interpreted imaging which showed  1. 5.5 x 5.2 x 6.8 cm irregular rim enhancing hypodense collection  in the upper pelvis just above the uterus and bladder in the midline  partially abutting the inflamed neoterminal ileum. Its Hounsfield  density is above that of simple fluid which could indicate viscous  abscess fluid, phlegmon or hematoma. It is also possible this could  communicate with 1 or more of the  surrounding small bowel segments  which it abuts.  2. Right ovary again noted enlarged and enhancing measuring 4.8 x  3.9 cm which could indicate secondary inflammation from adjacent  inflamed bowel or infection including possible tubo-ovarian abscess.  It is only slightly larger than 25 days ago.  3. Mild pelvic cul-de-sac fluid is likely reactive.  4. Constipation.  5. Stable borderline to slightly prominent mesenteric and right  common iliac chain lymph nodes.  6. Slightly enlarged and steatotic liver.   I agree with the radiologist interpretation    Problem List / ED Course / Critical interventions / Medication management   I ordered medication including Flagyl , Rocephin , LR bolus, Phenergan, Zofran , morphine , insulin , potassium Reevaluation of the patient after these medicines showed that the patient stayed the same I have reviewed the patients home medicines and have made adjustments as needed   Consultations Obtained:  I requested consultation with the hospitalist, Dr. Kayla or and discussed lab and imaging findings as well as pertinent plan - they recommend: admission  Requested consultation with a general surgeon, Dr. Dasie, who recommended: Antibiotics, day team to provide formal consult  I secure chatted Dr. Saintclair with [facility] gastroenterology to ask him to add patient to the a.m. consult list   Test / Admission - Considered:  Patient is critically ill with evidence of sepsis and DKA.  CT concerning for possible abscess versus phlegmon versus hematoma.  CT also showing concern for enlarged right ovary.  Discussed sexual history with patient.  She has not been sexually active for multiple months.  She had a GC chlamydia probe and wet prep in June which was unremarkable.  Do not believe based on his history that this is a TOA.  Patient needs admission at this time for further management of her sepsis and DKA.  She will need formal consults from specialist once  admitted.      Final diagnoses:  Lower abdominal pain  Sepsis, due to unspecified organism, unspecified whether acute organ dysfunction present [facility])  Diabetic ketoacidosis without coma associated with type 2 diabetes mellitus [facility])    ED Discharge Orders     None          Logan Ubaldo KATHEE DEVONNA 01/06/24 [id number]    Jerral Meth, MD 01/06/24 [id number]    Jerral Meth, MD 01/06/24 [phone]

## 2024-01-07 ENCOUNTER — Inpatient Hospital Stay (HOSPITAL_COMMUNITY)

## 2024-01-07 DIAGNOSIS — E1011 Type 1 diabetes mellitus with ketoacidosis with coma: Secondary | ICD-10-CM | POA: Diagnosis not present

## 2024-01-07 LAB — BASIC METABOLIC PANEL WITH GFR
Anion gap: 16 — ABNORMAL HIGH (ref 5–15)
Anion gap: 9 (ref 5–15)
Anion gap: 12 (ref 5–15)
BUN: 6 mg/dL (ref 6–20)
BUN: 5 mg/dL — ABNORMAL LOW (ref 6–20)
BUN: <5 mg/dL — ABNORMAL LOW (ref 6–20)
CO2: 17 mmol/L — ABNORMAL LOW (ref 22–32)
CO2: 22 mmol/L (ref 22–32)
CO2: 22 mmol/L (ref 22–32)
Calcium: 8.6 mg/dL — ABNORMAL LOW (ref 8.9–10.3)
Calcium: 8.1 mg/dL — ABNORMAL LOW (ref 8.9–10.3)
Calcium: 8.1 mg/dL — ABNORMAL LOW (ref 8.9–10.3)
Chloride: 101 mmol/L (ref 98–111)
Chloride: 102 mmol/L (ref 98–111)
Chloride: 100 mmol/L (ref 98–111)
Creatinine, Ser: 0.76 mg/dL (ref 0.44–1.00)
Creatinine, Ser: 0.61 mg/dL (ref 0.44–1.00)
Creatinine, Ser: 0.7 mg/dL (ref 0.44–1.00)
GFR, Estimated: >60 mL/min (ref >60)
GFR, Estimated: >60 mL/min (ref >60)
GFR, Estimated: >60 mL/min (ref >60)
Glucose, Bld: 297 mg/dL — ABNORMAL HIGH (ref 70–99)
Glucose, Bld: 169 mg/dL — ABNORMAL HIGH (ref 70–99)
Glucose, Bld: 190 mg/dL — ABNORMAL HIGH (ref 70–99)
Potassium: 3.8 mmol/L (ref 3.5–5.1)
Potassium: 3.5 mmol/L (ref 3.5–5.1)
Potassium: 3.6 mmol/L (ref 3.5–5.1)
Sodium: 134 mmol/L — ABNORMAL LOW (ref 135–145)
Sodium: 133 mmol/L — ABNORMAL LOW (ref 135–145)
Sodium: 134 mmol/L — ABNORMAL LOW (ref 135–145)

## 2024-01-07 LAB — GLUCOSE, CAPILLARY
Glucose-Capillary: 137 mg/dL — ABNORMAL HIGH (ref 70–99)
Glucose-Capillary: 138 mg/dL — ABNORMAL HIGH (ref 70–99)
Glucose-Capillary: 141 mg/dL — ABNORMAL HIGH (ref 70–99)
Glucose-Capillary: 155 mg/dL — ABNORMAL HIGH (ref 70–99)
Glucose-Capillary: 156 mg/dL — ABNORMAL HIGH (ref 70–99)
Glucose-Capillary: 162 mg/dL — ABNORMAL HIGH (ref 70–99)
Glucose-Capillary: 169 mg/dL — ABNORMAL HIGH (ref 70–99)
Glucose-Capillary: 187 mg/dL — ABNORMAL HIGH (ref 70–99)
Glucose-Capillary: 197 mg/dL — ABNORMAL HIGH (ref 70–99)
Glucose-Capillary: 212 mg/dL — ABNORMAL HIGH (ref 70–99)
Glucose-Capillary: 229 mg/dL — ABNORMAL HIGH (ref 70–99)
Glucose-Capillary: 271 mg/dL — ABNORMAL HIGH (ref 70–99)
Glucose-Capillary: 307 mg/dL — ABNORMAL HIGH (ref 70–99)
Glucose-Capillary: 82 mg/dL (ref 70–99)

## 2024-01-07 LAB — CBC
HCT: 31.2 % — ABNORMAL LOW (ref 36.0–46.0)
Hemoglobin: 9 g/dL — ABNORMAL LOW (ref 12.0–15.0)
MCH: 22.5 pg — ABNORMAL LOW (ref 26.0–34.0)
MCHC: 28.8 g/dL — ABNORMAL LOW (ref 30.0–36.0)
MCV: 78 fL — ABNORMAL LOW (ref 80.0–100.0)
Platelets: 603 10*3/uL — ABNORMAL HIGH (ref 150–400)
RBC: 4 MIL/uL (ref 3.87–5.11)
RDW: 18.1 % — ABNORMAL HIGH (ref 11.5–15.5)
WBC: 21 10*3/uL — ABNORMAL HIGH (ref 4.0–10.5)
nRBC: 0 % (ref 0.0–0.2)

## 2024-01-07 LAB — MAGNESIUM: Magnesium: 1.6 mg/dL — ABNORMAL LOW (ref 1.7–2.4)

## 2024-01-07 LAB — BETA-HYDROXYBUTYRIC ACID
Beta-Hydroxybutyric Acid: 0.69 mmol/L — ABNORMAL HIGH (ref 0.05–0.27)
Beta-Hydroxybutyric Acid: 3.71 mmol/L — ABNORMAL HIGH (ref 0.05–0.27)

## 2024-01-07 MED ORDER — METOPROLOL TARTRATE 5 MG/5ML IV SOLN: 5.0000 mg | INTRAVENOUS | Status: DC | PRN

## 2024-01-07 MED ORDER — MIDAZOLAM HCL 2 MG/2ML IJ SOLN
INTRAMUSCULAR | Status: AC | PRN
  Administered 2024-01-07: .5 mg via INTRAVENOUS

## 2024-01-07 MED ORDER — FENTANYL CITRATE (PF) 100 MCG/2ML IJ SOLN
INTRAMUSCULAR | Status: AC | PRN
  Administered 2024-01-07: 25 ug via INTRAVENOUS

## 2024-01-07 MED ORDER — MAGNESIUM SULFATE 4 GM/100ML IV SOLN
4.0000 g | Freq: Once | INTRAVENOUS | Status: AC
  Administered 2024-01-07: 4 g via INTRAVENOUS
  Filled 2024-01-07: qty 100

## 2024-01-07 MED ORDER — PROCHLORPERAZINE EDISYLATE 10 MG/2ML IJ SOLN
10.0000 mg | Freq: Four times a day (QID) | INTRAMUSCULAR | Status: DC | PRN
  Administered 2024-01-07: 10 mg via INTRAVENOUS
  Filled 2024-01-07: qty 2

## 2024-01-07 MED ORDER — POTASSIUM CHLORIDE 10 MEQ/100ML IV SOLN
10.0000 meq | INTRAVENOUS | Status: AC
  Administered 2024-01-07 (×2): 10 meq via INTRAVENOUS
  Filled 2024-01-07 (×2): qty 100

## 2024-01-07 MED ORDER — IPRATROPIUM-ALBUTEROL 0.5-2.5 (3) MG/3ML IN SOLN: 3.0000 mL | RESPIRATORY_TRACT | Status: DC | PRN

## 2024-01-07 MED ORDER — SODIUM CHLORIDE 0.9% FLUSH
5.0000 mL | Freq: Three times a day (TID) | INTRAVENOUS | Status: DC
  Administered 2024-01-07 – 2024-01-11 (×11): 5 mL

## 2024-01-07 MED ORDER — DEXTROSE IN LACTATED RINGERS 5 % IV SOLN: INTRAVENOUS | Status: AC

## 2024-01-07 MED ORDER — HYDRALAZINE HCL 20 MG/ML IJ SOLN: 10.0000 mg | INTRAMUSCULAR | Status: DC | PRN

## 2024-01-07 MED ORDER — LIDOCAINE HCL 1 % IJ SOLN
INTRAMUSCULAR | Status: AC | PRN
  Administered 2024-01-07: 10 mL via INTRADERMAL

## 2024-01-07 MED ORDER — LACTATED RINGERS IV SOLN: INTRAVENOUS | Status: AC

## 2024-01-07 MED ORDER — MIDAZOLAM HCL 2 MG/2ML IJ SOLN
INTRAMUSCULAR | Status: AC | PRN
  Administered 2024-01-07: 1 mg via INTRAVENOUS

## 2024-01-07 MED ORDER — NALOXONE HCL 0.4 MG/ML IJ SOLN
INTRAMUSCULAR | Status: AC
  Filled 2024-01-07: qty 1

## 2024-01-07 MED ORDER — MIDAZOLAM HCL 2 MG/2ML IJ SOLN
INTRAMUSCULAR | Status: AC
Start: 2024-01-07 — End: 2024-01-07
  Filled 2024-01-07: qty 2

## 2024-01-07 MED ORDER — FLUMAZENIL 0.5 MG/5ML IV SOLN
INTRAVENOUS | Status: AC
  Filled 2024-01-07: qty 5

## 2024-01-07 MED ORDER — SODIUM CHLORIDE 0.9 % IV SOLN
INTRAVENOUS | Status: AC
  Filled 2024-01-07: qty 250

## 2024-01-07 MED ORDER — IPRATROPIUM-ALBUTEROL 0.5-2.5 (3) MG/3ML IN SOLN
3.0000 mL | Freq: Four times a day (QID) | RESPIRATORY_TRACT | Status: DC
  Filled 2024-01-07: qty 3

## 2024-01-07 MED ORDER — INSULIN REGULAR(HUMAN) IN NACL 100-0.9 UT/100ML-% IV SOLN
INTRAVENOUS | Status: DC
  Administered 2024-01-07: 18 [IU]/h via INTRAVENOUS
  Filled 2024-01-07: qty 100

## 2024-01-07 MED ORDER — FENTANYL CITRATE (PF) 100 MCG/2ML IJ SOLN
INTRAMUSCULAR | Status: AC
  Filled 2024-01-07: qty 2

## 2024-01-07 NOTE — Procedures (Signed)
 Interventional Radiology Procedure Note  Procedure: CT guided abdominal abscess drain   Findings: Please refer to procedural dictation for full description. 10 Fr pigtail via RLQ into lower abdominal abscess.  Sample sent for culture.  Complications: None immediate  Estimated Blood Loss: < 5 mL  Recommendations: Keep to bulb suction for now. Follow cultures. IR will follow.   Ester Sides, MD

## 2024-01-07 NOTE — Progress Notes (Signed)
 PROGRESS NOTE    Connie Williamson  FMW:[id number] DOB: [birth year] DOA: 01/05/2024 PCP: Paseda, Folashade R, FNP    Brief Narrative:   28 y.o. female with medical history significant of asthma, hypertension, DM [name], class I obesity, type 1 diabetes, anxiety and depression, iron deficiency anemia, Crohn's disease complicated by adhesion requiring lysis and small bowel resection who presented to the emergency department complaints of abdominal pain, nausea and multiple episodes of emesis.  She has been constipated and nauseous for several days, but started vomiting yesterday.  Last month she was admitted for Crohn's disease.  Upon admission found to have pelvic/ovarian abscess, GI, general surgery, IR consulted.  Plans for aspiration by IR today   Assessment & Plan:  Principal Problem:   DKA (diabetic ketoacidosis) (HCC) Active Problems:   Uncontrolled type 2 diabetes mellitus with hyperglycemia, with long-term current use of insulin  (HCC)   Essential hypertension, benign   Asthma   Sleep apnea   Obesity (BMI 30-39.9)   Crohn's disease (HCC)   Iron deficiency anemia   Anxiety and depression   Moderate protein malnutrition (HCC)   Hypomagnesemia   Thrombocytosis   Pelvic abscess in female   Diabetic ketoacidosis Uncontrolled insulin -dependent DM2 with hyperglycemia   Patient was transition to subcu insulin  but now appears to be back in DKA.  Will start patient back on Endo tool and we will keep her on insulin  drip until p.o. intake improves.   Pelvic abscess in female  On empiric Rocephin  and Flagyl .  Seen on the CT and ultrasound pelvic.  Recommending MRI pelvic or repeating ultrasound in 6 weeks - In the meantime IR to assess for drain placement     Crohn's disease (HCC) GI team is following.  Holding off on steroids, continue antibiotics     Essential hypertension, benign IV as needed     Asthma DuoNebs as needed     Sleep apnea Given intra-abdominal issues. Hold  CPAP for now.     Thrombocytosis Secondary to anemia and acute illness.     Iron deficiency anemia Monitor hematocrit hemoglobin.  Eventually can benefit from outpatient IV iron infusion referral.  I will avoid IV iron in the setting of abscess  Iron/TIBC/Ferritin/ %Sat    Component Value Date/Time   IRON 17 (L) 12/25/2023 1049   TIBC 243 (L) 12/25/2023 1049   FERRITIN 34 12/25/2023 1049   IRONPCTSAT 7 (LL) 12/25/2023 1049       Anxiety and depression Resume p.o. medications when able     Moderate protein malnutrition (HCC) Encourage p.o. intake when able to tolerate     Hypomagnesemia As needed repletion     Obesity (BMI 30-39.9) Current BMI 33.31 kg/m. Would benefit from lifestyle modifications. Follow-up closely with PCP.   DVT prophylaxis: SCDs Start: 01/06/24 0324    Code Status: Full Code Family Communication: Mother at bedside Status is: Inpatient Continue hospital stay for DKA and abdominal pelvis infection    Subjective: Over night patient went back into DKA with feeling of nausea   Examination:  General exam: Appears calm and comfortable  Respiratory system: Clear to auscultation. Respiratory effort normal. Cardiovascular system: S1 & S2 heard, RRR. No JVD, murmurs, rubs, gallops or clicks. No pedal edema. Gastrointestinal system: Abdomen is nondistended, soft and nontender. No organomegaly or masses felt. Normal bowel sounds heard. Central nervous system: Alert and oriented. No focal neurological deficits. Extremities: Symmetric 5 x 5 power. Skin: No rashes, lesions or ulcers Psychiatry: Judgement and insight appear normal.  Mood & affect appropriate.                Diet Orders (From admission, onward)     Start     Ordered   01/07/24 0828  Diet NPO time specified  (Diabetes Ketoacidosis (DKA))  Diet effective now        01/07/24 0828            Objective: Vitals:   01/07/24 0602 01/07/24 0700 01/07/24 0800 01/07/24 1100   BP: (!) 154/91 (!) 140/83 (!) 143/82 121/74  Pulse: (!) 113 (!) 107 (!) 112 (!) 110  Resp: 13 (!) 22 19 (!) 22  Temp:   99.6 F (37.6 C)   TempSrc:   Axillary   SpO2: 96% 97% 99% 98%  Weight:      Height:        Intake/Output Summary (Last 24 hours) at 01/07/2024 1154 Last data filed at 01/07/2024 1044 Gross per 24 hour  Intake 2964.08 ml  Output --  Net 2964.08 ml   Filed Weights   01/06/24 0530 01/07/24 0500  Weight: 85.3 kg 86.9 kg    Scheduled Meds:  Chlorhexidine Gluconate Cloth  6 each Topical Daily   FLUoxetine   20 mg Oral Daily   Continuous Infusions:  cefTRIAXone  (ROCEPHIN )  IV Stopped (01/07/24 0044)   dextrose 5% lactated ringers  125 mL/hr at 01/07/24 1044   insulin  6.5 Units/hr (01/07/24 1044)   lactated ringers  Stopped (01/07/24 1027)   metronidazole  Stopped (01/07/24 0357)   promethazine (PHENERGAN) injection (IM or IVPB) Stopped (01/07/24 9381)    Nutritional status     Body mass index is 33.94 kg/m.  Data Reviewed:   CBC: Recent Labs  Lab 01/05/24 2236 01/06/24 0532 01/07/24 0601  WBC 30.6* 27.1* 21.0*  NEUTROABS  --  23.8*  --   HGB 11.3* 10.0* 9.0*  HCT 40.2 35.1* 31.2*  MCV 79.9* 79.2* 78.0*  PLT 837* 707* 603*   Basic Metabolic Panel: Recent Labs  Lab 01/05/24 2236 01/06/24 0532 01/06/24 1006 01/06/24 1503 01/07/24 0601  NA 131* 135 135 135 134*  K 4.5 3.6 3.4* 3.8 3.8  CL 102 106 104 103 101  CO2 9* 16* 21* 23 17*  GLUCOSE 404* 159* 188* 132* 297*  BUN 9 7 6 6 6   CREATININE 0.89 0.72 0.47 0.60 0.76  CALCIUM  9.2 9.0 8.9 8.7* 8.6*  MG  --  1.5*  --   --  1.6*  PHOS  --   --  2.9  --   --    GFR: Estimated Creatinine Clearance: 108.4 mL/min (by C-G formula based on SCr of 0.76 mg/dL). Liver Function Tests: Recent Labs  Lab 01/05/24 2236 01/06/24 0532  AST <5* 7*  ALT 7 8  ALKPHOS 149* 122  BILITOT 1.4* 1.0  PROT 9.1* 7.3  ALBUMIN 3.3* 2.8*   Recent Labs  Lab 01/05/24 2236  LIPASE 24   No results for  input(s): AMMONIA in the last 168 hours. Coagulation Profile: No results for input(s): INR, PROTIME in the last 168 hours. Cardiac Enzymes: No results for input(s): CKTOTAL, CKMB, CKMBINDEX, TROPONINI in the last 168 hours. BNP (last 3 results) No results for input(s): PROBNP in the last 8760 hours. HbA1C: No results for input(s): HGBA1C in the last 72 hours. CBG: Recent Labs  Lab 01/07/24 0002 01/07/24 0351 01/07/24 0912 01/07/24 1015 01/07/24 1114  GLUCAP 229* 271* 307* 212* 169*   Lipid Profile: No results for input(s): CHOL, HDL, LDLCALC, TRIG, CHOLHDL,  LDLDIRECT in the last 72 hours. Thyroid Function Tests: No results for input(s): TSH, T4TOTAL, FREET4, T3FREE, THYROIDAB in the last 72 hours. Anemia Panel: No results for input(s): VITAMINB12, FOLATE, FERRITIN, TIBC, IRON, RETICCTPCT in the last 72 hours. Sepsis Labs: Recent Labs  Lab 01/06/24 0057 01/06/24 0250  LATICACIDVEN 1.8 0.9    Recent Results (from the past 240 hours)  Blood culture (routine x 2)     Status: None (Preliminary result)   Collection Time: 01/06/24 12:28 AM   Specimen: BLOOD  Result Value Ref Range Status   Specimen Description   Final    BLOOD BLOOD LEFT ARM Performed at [facility], [address], [place], [place] [address]    Special Requests   Final    BOTTLES DRAWN AEROBIC AND ANAEROBIC Blood Culture results may not be optimal due to an inadequate volume of blood received in culture bottles Performed at [facility], [address], [place], [place] [address]    Culture   Final    NO GROWTH 1 DAY Performed at [facility], [address] [place], [place] [address]    Report Status PENDING  Incomplete  Blood culture (routine x 2)     Status: None (Preliminary result)   Collection Time: 01/06/24 12:49 AM   Specimen: BLOOD  Result Value Ref Range Status   Specimen Description   Final     BLOOD CENTRAL LINE Performed at [facility], [address] [place], [place] [address]    Special Requests   Final    BOTTLES DRAWN AEROBIC AND ANAEROBIC Blood Culture results may not be optimal due to an inadequate volume of blood received in culture bottles Performed at [facility], [address], [place], [place] [address]    Culture   Final    NO GROWTH 1 DAY Performed at [facility], [address] [place], [place] [address]    Report Status PENDING  Incomplete  MRSA Next Gen by PCR, Nasal     Status: None   Collection Time: 01/06/24  3:39 AM   Specimen: Nasal Mucosa; Nasal Swab  Result Value Ref Range Status   MRSA by PCR Next Gen NOT DETECTED NOT DETECTED Final    Comment: (NOTE) The GeneXpert MRSA Assay (FDA approved for NASAL specimens only), is one component of a comprehensive MRSA colonization surveillance program. It is not intended to diagnose MRSA infection nor to guide or monitor treatment for MRSA infections. Test performance is not FDA approved in patients less than 68 years old. Performed at [facility], [address], [place], [place] [address]   Culture, blood (single)     Status: None (Preliminary result)   Collection Time: 01/06/24  7:08 AM   Specimen: BLOOD  Result Value Ref Range Status   Specimen Description   Final    BLOOD BLOOD RIGHT HAND Performed at [facility], [address], [place], [place] [address]    Special Requests   Final    BOTTLES DRAWN AEROBIC AND ANAEROBIC Blood Culture results may not be optimal due to an inadequate volume of blood received in culture bottles Performed at [facility], [address] [place], [place] [address]    Culture   Final    NO GROWTH < 24 HOURS Performed at [facility], [address] [place], [place] [address]    Report Status PENDING  Incomplete  Radiology Studies: CT  ABDOMEN PELVIS W CONTRAST Result Date: 01/06/2024 CLINICAL DATA:  Bowel obstruction suspected.  Sepsis. EXAM: CT ABDOMEN AND PELVIS WITH CONTRAST TECHNIQUE: Multidetector CT imaging of the abdomen and pelvis was performed using the standard protocol following bolus administration of intravenous contrast. RADIATION DOSE REDUCTION: This exam was performed according to the departmental dose-optimization program which includes automated exposure control, adjustment of the mA and/or kV according to patient size and/or use of iterative reconstruction technique. CONTRAST:  OMNIPAQUE  IOHEXOL  300 MG/ML  SOLN COMPARISON:  CTs with IV contrast 12/12/2023, 11/15/2023 and CT pelvis with contrast 03/04/2022. Pelvic ultrasound 12/12/2023. FINDINGS: Lower chest: No abnormality. Hepatobiliary: 19 cm in length liver, slightly steatotic without mass. Unremarkable gallbladder and bile ducts. Pancreas: No abnormality. Spleen: No abnormality. Adrenals/Urinary Tract: No abnormality. Stomach/Bowel: Unremarkable contracted stomach. Normal caliber upper to mid abdominal small bowel. Unremarkable small bowel anastomosis anterior left lower abdominopelvic quadrant. Remote ileocecectomy with primary surgical anastomosis, again noted inflamed appearance in the distal ileum leading up to the anastomosis. There is interval development of an irregular rim enhancing hypodense collection in the upper pelvis just above the uterus and bladder in the midline measuring 5.5 x 5.2 x 6.8 cm partially abutting the inflamed neoterminal ileum. Its Hounsfield density is above that of simple fluid ranging 41-47 which could indicate viscous abscess fluid, phlegmon or hematoma. There is no air in the collection. It is also possible this could communicate with 1 or more of the surrounding small bowel segments which it abuts. There are no dilated segments to suspect mechanical obstruction. The colon wall is normal thickness with mild fecal stasis.  Vascular/Lymphatic: No significant vascular findings. Borderline to slightly prominent mesenteric lymph nodes are unchanged. There is a right common iliac chain node measuring 1 cm in short axis, unchanged. No new adenopathy. Reproductive: The the uterus is intact. Left ovary unremarkable. Right ovary again noted enlarged and enhancing measuring 4.8 x 3.9 cm which could indicate secondary inflammation from adjacent inflamed bowel or infection including possible tubo-ovarian abscess. It is only slightly larger than 25 days ago. Other: Mild pelvic cul-de-sac fluid is likely reactive. No other free fluid is seen. No free air or abdominal wall hernia. Musculoskeletal: No acute or significant osseous findings. IMPRESSION: 1. 5.5 x 5.2 x 6.8 cm irregular rim enhancing hypodense collection in the upper pelvis just above the uterus and bladder in the midline partially abutting the inflamed neoterminal ileum. Its Hounsfield density is above that of simple fluid which could indicate viscous abscess fluid, phlegmon or hematoma. It is also possible this could communicate with 1 or more of the surrounding small bowel segments which it abuts. 2. Right ovary again noted enlarged and enhancing measuring 4.8 x 3.9 cm which could indicate secondary inflammation from adjacent inflamed bowel or infection including possible tubo-ovarian abscess. It is only slightly larger than 25 days ago. 3. Mild pelvic cul-de-sac fluid is likely reactive. 4. Constipation. 5. Stable borderline to slightly prominent mesenteric and right common iliac chain lymph nodes. 6. Slightly enlarged and steatotic liver. Electronically Signed   By: Francis Quam M.D.   On: 01/06/2024 02:19           LOS: 1 day   Time spent= 35 mins    Burgess JAYSON Dare, MD [facility]  If 7PM-7AM, please contact night-coverage  01/07/2024, 11:54 AM

## 2024-01-07 NOTE — Progress Notes (Signed)
 DKA (diabetic ketoacidosis) (HCC)  Subjective: Pt feeling a bit better, nausea has improved.  Was not able to drink the PO contrast for repeat CT that IR requested due to her nausea yesterday.  Had a BM earlier this am  Objective: Vital signs in last 24 hours: Temp:  [98.3 F (36.8 C)-99.7 F (37.6 C)] 99.2 F (37.3 C) (07/01 0400) Pulse Rate:  [102-118] 113 (07/01 0602) Resp:  [11-40] 13 (07/01 0602) BP: (96-157)/(46-93) 154/91 (07/01 0602) SpO2:  [96 %-100 %] 96 % (07/01 0602) Weight:  [86.9 kg] 86.9 kg (07/01 0500) Last BM Date : 01/06/24  Intake/Output from previous day: 06/30 0701 - 07/01 0700 In: 2608.4 [P.O.:50; I.V.:1823.6; IV Piggyback:734.9] Out: -  Intake/Output this shift: No intake/output data recorded.  General appearance: alert and cooperative GI: TTP periumbilical area  Lab Results:  Results for orders placed or performed during the hospital encounter of 01/05/24 (from the past 24 hours)  Glucose, capillary     Status: Abnormal   Collection Time: 01/06/24  9:05 AM  Result Value Ref Range   Glucose-Capillary 175 (H) 70 - 99 mg/dL  Glucose, capillary     Status: Abnormal   Collection Time: 01/06/24 10:05 AM  Result Value Ref Range   Glucose-Capillary 178 (H) 70 - 99 mg/dL  Beta-hydroxybutyric acid     Status: Abnormal   Collection Time: 01/06/24 10:06 AM  Result Value Ref Range   Beta-Hydroxybutyric Acid 1.06 (H) 0.05 - 0.27 mmol/L  Basic metabolic panel with GFR     Status: Abnormal   Collection Time: 01/06/24 10:06 AM  Result Value Ref Range   Sodium 135 135 - 145 mmol/L   Potassium 3.4 (L) 3.5 - 5.1 mmol/L   Chloride 104 98 - 111 mmol/L   CO2 21 (L) 22 - 32 mmol/L   Glucose, Bld 188 (H) 70 - 99 mg/dL   BUN 6 6 - 20 mg/dL   Creatinine, Ser 9.52 0.44 - 1.00 mg/dL   Calcium  8.9 8.9 - 10.3 mg/dL   GFR, Estimated >39 >39 mL/min   Anion gap 10 5 - 15  Phosphorus     Status: None   Collection Time: 01/06/24 10:06 AM  Result Value Ref Range    Phosphorus 2.9 2.5 - 4.6 mg/dL  Glucose, capillary     Status: Abnormal   Collection Time: 01/06/24 11:26 AM  Result Value Ref Range   Glucose-Capillary 158 (H) 70 - 99 mg/dL  Glucose, capillary     Status: Abnormal   Collection Time: 01/06/24 12:24 PM  Result Value Ref Range   Glucose-Capillary 157 (H) 70 - 99 mg/dL  Glucose, capillary     Status: Abnormal   Collection Time: 01/06/24  1:41 PM  Result Value Ref Range   Glucose-Capillary 152 (H) 70 - 99 mg/dL  Glucose, capillary     Status: Abnormal   Collection Time: 01/06/24  3:00 PM  Result Value Ref Range   Glucose-Capillary 141 (H) 70 - 99 mg/dL  Beta-hydroxybutyric acid     Status: Abnormal   Collection Time: 01/06/24  3:03 PM  Result Value Ref Range   Beta-Hydroxybutyric Acid 0.51 (H) 0.05 - 0.27 mmol/L  Basic metabolic panel with GFR     Status: Abnormal   Collection Time: 01/06/24  3:03 PM  Result Value Ref Range   Sodium 135 135 - 145 mmol/L   Potassium 3.8 3.5 - 5.1 mmol/L   Chloride 103 98 - 111 mmol/L   CO2 23 22 - 32  mmol/L   Glucose, Bld 132 (H) 70 - 99 mg/dL   BUN 6 6 - 20 mg/dL   Creatinine, Ser 9.39 0.44 - 1.00 mg/dL   Calcium  8.7 (L) 8.9 - 10.3 mg/dL   GFR, Estimated >39 >39 mL/min   Anion gap 9 5 - 15  Glucose, capillary     Status: Abnormal   Collection Time: 01/06/24  4:05 PM  Result Value Ref Range   Glucose-Capillary 145 (H) 70 - 99 mg/dL  Glucose, capillary     Status: Abnormal   Collection Time: 01/06/24  5:11 PM  Result Value Ref Range   Glucose-Capillary 138 (H) 70 - 99 mg/dL  Glucose, capillary     Status: Abnormal   Collection Time: 01/06/24  6:00 PM  Result Value Ref Range   Glucose-Capillary 131 (H) 70 - 99 mg/dL  Beta-hydroxybutyric acid     Status: Abnormal   Collection Time: 01/06/24  6:07 PM  Result Value Ref Range   Beta-Hydroxybutyric Acid 0.56 (H) 0.05 - 0.27 mmol/L  Glucose, capillary     Status: Abnormal   Collection Time: 01/06/24  9:52 PM  Result Value Ref Range    Glucose-Capillary 188 (H) 70 - 99 mg/dL  Glucose, capillary     Status: Abnormal   Collection Time: 01/07/24 12:02 AM  Result Value Ref Range   Glucose-Capillary 229 (H) 70 - 99 mg/dL  Glucose, capillary     Status: Abnormal   Collection Time: 01/07/24  3:51 AM  Result Value Ref Range   Glucose-Capillary 271 (H) 70 - 99 mg/dL  Basic metabolic panel     Status: Abnormal   Collection Time: 01/07/24  6:01 AM  Result Value Ref Range   Sodium 134 (L) 135 - 145 mmol/L   Potassium 3.8 3.5 - 5.1 mmol/L   Chloride 101 98 - 111 mmol/L   CO2 17 (L) 22 - 32 mmol/L   Glucose, Bld 297 (H) 70 - 99 mg/dL   BUN 6 6 - 20 mg/dL   Creatinine, Ser 9.23 0.44 - 1.00 mg/dL   Calcium  8.6 (L) 8.9 - 10.3 mg/dL   GFR, Estimated >39 >39 mL/min   Anion gap 16 (H) 5 - 15  CBC     Status: Abnormal   Collection Time: 01/07/24  6:01 AM  Result Value Ref Range   WBC 21.0 (H) 4.0 - 10.5 K/uL   RBC 4.00 3.87 - 5.11 MIL/uL   Hemoglobin 9.0 (L) 12.0 - 15.0 g/dL   HCT 68.7 (L) 63.9 - 53.9 %   MCV 78.0 (L) 80.0 - 100.0 fL   MCH 22.5 (L) 26.0 - 34.0 pg   MCHC 28.8 (L) 30.0 - 36.0 g/dL   RDW 81.8 (H) 88.4 - 84.4 %   Platelets 603 (H) 150 - 400 K/uL   nRBC 0.0 0.0 - 0.2 %  Magnesium     Status: Abnormal   Collection Time: 01/07/24  6:01 AM  Result Value Ref Range   Magnesium 1.6 (L) 1.7 - 2.4 mg/dL     Studies/Results Radiology     MEDS, Scheduled  Chlorhexidine Gluconate Cloth  6 each Topical Daily   FLUoxetine   20 mg Oral Daily   ipratropium-albuterol   3 mL Nebulization Q6H     Assessment: DKA (diabetic ketoacidosis) (HCC) Crohn's disease with abscess  Plan: Pt to try to drink the PO CT contrast today for better look at small bowel surrounding her fluid collection IV antibiotics seem to be working well, wbc down, symptoms  improving No plans for immediate surgery at the moement   LOS: 1 day    [name], MD [facility], [place]  Moderate decision making   01/07/2024 8:14  AM

## 2024-01-07 NOTE — Plan of Care (Signed)
  Problem: Activity: Goal: Risk for activity intolerance will decrease Outcome: Progressing   Problem: Coping: Goal: Level of anxiety will decrease Outcome: Progressing   Problem: Elimination: Goal: Will not experience complications related to bowel motility Outcome: Progressing Goal: Will not experience complications related to urinary retention Outcome: Progressing   

## 2024-01-07 NOTE — Hospital Course (Addendum)
 Brief Narrative:   28 y.o. female with medical history significant of asthma, hypertension, DM Connie Williamson, class I obesity, type 1 diabetes, anxiety and depression, iron deficiency anemia, Crohn's disease complicated by adhesion requiring lysis and small bowel resection who presented to the emergency department complaints of abdominal pain, nausea and multiple episodes of emesis.  She has been constipated and nauseous for several days, but started vomiting yesterday.  Last month she was admitted for Crohn's disease.  Upon admission found to have pelvic/ovarian abscess, GI, general surgery, IR consulted.  Plans for aspiration by IR today   Assessment & Plan:  Principal Problem:   DKA (diabetic ketoacidosis) (HCC) Active Problems:   Uncontrolled type 2 diabetes mellitus with hyperglycemia, with long-term current use of insulin  (HCC)   Essential hypertension, benign   Asthma   Sleep apnea   Obesity (BMI 30-39.9)   Crohn's disease (HCC)   Iron deficiency anemia   Anxiety and depression   Moderate protein malnutrition (HCC)   Hypomagnesemia   Thrombocytosis   Pelvic abscess in female   Diabetic ketoacidosis Uncontrolled insulin -dependent DM2 with hyperglycemia   Patient was transition to subcu insulin  but now appears to be back in DKA.  Will start patient back on Endo tool and we will keep her on insulin  drip until p.o. intake improves.   Pelvic abscess in female  On empiric Rocephin  and Flagyl .  Seen on the CT and ultrasound pelvic.  Recommending MRI pelvic or repeating ultrasound in 6 weeks - In the meantime IR to assess for drain placement     Crohn's disease (HCC) GI team is following.  Holding off on steroids, continue antibiotics     Essential hypertension, benign IV as needed     Asthma DuoNebs as needed     Sleep apnea Given intra-abdominal issues. Hold CPAP for now.     Thrombocytosis Secondary to anemia and acute illness.     Iron deficiency anemia Monitor hematocrit  hemoglobin.  Eventually can benefit from outpatient IV iron infusion referral.  I will avoid IV iron in the setting of abscess  Iron/TIBC/Ferritin/ %Sat    Component Value Date/Time   IRON 17 (L) 12/25/2023 1049   TIBC 243 (L) 12/25/2023 1049   FERRITIN 34 12/25/2023 1049   IRONPCTSAT 7 (LL) 12/25/2023 1049       Anxiety and depression Resume p.o. medications when able     Moderate protein malnutrition (HCC) Encourage p.o. intake when able to tolerate     Hypomagnesemia As needed repletion     Obesity (BMI 30-39.9) Current BMI 33.31 kg/m. Would benefit from lifestyle modifications. Follow-up closely with PCP.   DVT prophylaxis: SCDs Start: 01/06/24 0324    Code Status: Full Code Family Communication: Mother at bedside Status is: Inpatient Continue hospital stay for DKA and abdominal pelvis infection    Subjective: Over night patient went back into DKA with feeling of nausea   Examination:  General exam: Appears calm and comfortable  Respiratory system: Clear to auscultation. Respiratory effort normal. Cardiovascular system: S1 & S2 heard, RRR. No JVD, murmurs, rubs, gallops or clicks. No pedal edema. Gastrointestinal system: Abdomen is nondistended, soft and nontender. No organomegaly or masses felt. Normal bowel sounds heard. Central nervous system: Alert and oriented. No focal neurological deficits. Extremities: Symmetric 5 x 5 power. Skin: No rashes, lesions or ulcers Psychiatry: Judgement and insight appear normal. Mood & affect appropriate.

## 2024-01-07 NOTE — Inpatient Diabetes Management (Signed)
 Inpatient Diabetes Program Recommendations  AACE/ADA: New Consensus Statement on Inpatient Glycemic Control (2015)  Target Ranges:  Prepandial:   less than 140 mg/dL      Peak postprandial:   less than 180 mg/dL (1-2 hours)      Critically ill patients:  140 - 180 mg/dL   Lab Results  Component Value Date   GLUCAP 271 (H) 01/07/2024   HGBA1C 10.9 (H) 11/17/2023    Review of Glycemic Control  Diabetes history: DM1 Outpatient Diabetes medications: Tresiba  40 daily Current orders for Inpatient glycemic control: Started IV insulin  per EndoTool for DKA this am  Inpatient Diabetes Program Recommendations:    Pt back in DKA, restarted IV insulin .  When ready for transition to SQ:  Semglee  30-35 units Q24H  Novolog  0-9 units Q4H x 12H, then TID with meals and 0-5 HS  Novolog  3 units TID with meals if eating > 50%  Needs Novolog  prescription at discharge.  Continue to follow.  Thank you. Shona Brandy, RD, LDN, CDCES Inpatient Diabetes Coordinator [phone]

## 2024-01-07 NOTE — Progress Notes (Signed)
 [facility] Progress Note  SUBJECTIVE:   Interval history: Connie Williamson was seen and evaluated today at bedside. Mother at bedside. Patient denied abdominal pain. Able to tolerate oral contrast this AM with no nausea or vomiting. No chest pain or shortness of breath. Having bowel movement, non-bloody, from contrast.   Past Medical History:  Diagnosis Date   Asthma    Crohn's disease (HCC)    Diabetes mellitus    Intramural leiomyoma of uterus    Menorrhagia    Past Surgical History:  Procedure Laterality Date   ADENOIDECTOMY     BOWEL RESECTION     TONSILLECTOMY     Current Facility-Administered Medications  Medication Dose Route Frequency Provider Last Rate Last Admin   acetaminophen  (TYLENOL ) tablet 650 mg  650 mg Oral Q6H PRN Howerter, Justin B, DO       Or   acetaminophen  (TYLENOL ) suppository 650 mg  650 mg Rectal Q6H PRN Howerter, Justin B, DO       cefTRIAXone  (ROCEPHIN ) 2 g in sodium chloride  0.9 % 100 mL IVPB  2 g Intravenous Q24H Howerter, Justin B, DO   Stopped at 01/07/24 0044   Chlorhexidine Gluconate Cloth 2 % PADS 6 each  6 each Topical Daily Howerter, Justin B, DO   6 each at 01/06/24 0445   dextrose 5 % in lactated ringers  infusion   Intravenous Continuous Amin, Ankit C, MD 125 mL/hr at 01/07/24 1044 Infusion Verify at 01/07/24 1044   dextrose 50 % solution 0-50 mL  0-50 mL Intravenous PRN Logan Martinis B, PA-C       FLUoxetine  (PROZAC ) capsule 20 mg  20 mg Oral Daily Celinda Alm Lot, MD   20 mg at 01/07/24 9074   hydrALAZINE (APRESOLINE) injection 10 mg  10 mg Intravenous Q4H PRN Amin, Ankit C, MD       HYDROmorphone  (DILAUDID ) injection 0.5 mg  0.5 mg Intravenous Q2H PRN Howerter, Justin B, DO   0.5 mg at 01/07/24 0602   insulin  regular, human (MYXREDLIN) 100 units/ 100 mL infusion   Intravenous Continuous Amin, Ankit C, MD 6.5 mL/hr at 01/07/24 1044 6.5 Units/hr at 01/07/24 1044   ipratropium-albuterol  (DUONEB) 0.5-2.5 (3) MG/3ML nebulizer  solution 3 mL  3 mL Nebulization Q4H PRN Amin, Ankit C, MD       lactated ringers  infusion   Intravenous Continuous Amin, Ankit C, MD   Stopped at 01/07/24 1027   metoprolol tartrate (LOPRESSOR) injection 5 mg  5 mg Intravenous Q4H PRN Amin, Ankit C, MD       metroNIDAZOLE  (FLAGYL ) IVPB 500 mg  500 mg Intravenous Q12H Howerter, Justin B, DO   Stopped at 01/07/24 0357   naloxone  (NARCAN ) injection 0.4 mg  0.4 mg Intravenous PRN Howerter, Justin B, DO       ondansetron  (ZOFRAN ) injection 4 mg  4 mg Intravenous Q6H PRN Howerter, Justin B, DO   4 mg at 01/07/24 0905   Oral care mouth rinse  15 mL Mouth Rinse PRN Howerter, Justin B, DO       potassium chloride 10 mEq in 100 mL IVPB  10 mEq Intravenous Q1H Amin, Ankit C, MD 100 mL/hr at 01/07/24 1044 Infusion Verify at 01/07/24 1044   promethazine (PHENERGAN) 12.5 mg in sodium chloride  0.9 % 50 mL IVPB  12.5 mg Intravenous Q6H PRN Howerter, Justin B, DO   Stopped at 01/07/24 0618   sodium chloride  flush (NS) 0.9 % injection 10-40 mL  10-40 mL Intracatheter PRN Countryman,  Chase, MD       Allergies as of 01/05/2024   (No Known Allergies)   Review of Systems:  Review of Systems  Respiratory:  Negative for shortness of breath.   Cardiovascular:  Negative for chest pain.  Gastrointestinal:  Negative for abdominal pain, nausea and vomiting.    OBJECTIVE:   Temp:  [98.3 F (36.8 C)-99.7 F (37.6 C)] 99.6 F (37.6 C) (07/01 0800) Pulse Rate:  [102-118] 112 (07/01 0800) Resp:  [11-40] 19 (07/01 0800) BP: (96-154)/(46-91) 143/82 (07/01 0800) SpO2:  [96 %-100 %] 99 % (07/01 0800) Weight:  [86.9 kg] 86.9 kg (07/01 0500) Last BM Date : 01/06/24 Physical Exam Constitutional:      General: She is not in acute distress.    Appearance: She is not ill-appearing, toxic-appearing or diaphoretic.   Cardiovascular:     Rate and Rhythm: Normal rate and regular rhythm.  Pulmonary:     Effort: No respiratory distress.     Breath sounds: Normal breath  sounds.  Abdominal:     General: There is no distension.     Palpations: Abdomen is soft.     Tenderness: There is no abdominal tenderness. There is no guarding.   Musculoskeletal:     Right lower leg: Edema (trace) present.     Left lower leg: Edema (trace) present.   Skin:    General: Skin is warm and dry.   Neurological:     Mental Status: She is alert.     Labs: Recent Labs    01/05/24 2236 01/06/24 0532 01/07/24 0601  WBC 30.6* 27.1* 21.0*  HGB 11.3* 10.0* 9.0*  HCT 40.2 35.1* 31.2*  PLT 837* 707* 603*   BMET Recent Labs    01/06/24 1006 01/06/24 1503 01/07/24 0601  NA 135 135 134*  K 3.4* 3.8 3.8  CL 104 103 101  CO2 21* 23 17*  GLUCOSE 188* 132* 297*  BUN 6 6 6   CREATININE 0.47 0.60 0.76  CALCIUM  8.9 8.7* 8.6*   LFT Recent Labs    01/06/24 0532  PROT 7.3  ALBUMIN 2.8*  AST 7*  ALT 8  ALKPHOS 122  BILITOT 1.0   PT/INR No results for input(s): LABPROT, INR in the last 72 hours. Diagnostic imaging: CT ABDOMEN PELVIS W CONTRAST Result Date: 01/06/2024 CLINICAL DATA:  Bowel obstruction suspected.  Sepsis. EXAM: CT ABDOMEN AND PELVIS WITH CONTRAST TECHNIQUE: Multidetector CT imaging of the abdomen and pelvis was performed using the standard protocol following bolus administration of intravenous contrast. RADIATION DOSE REDUCTION: This exam was performed according to the departmental dose-optimization program which includes automated exposure control, adjustment of the mA and/or kV according to patient size and/or use of iterative reconstruction technique. CONTRAST:  OMNIPAQUE  IOHEXOL  300 MG/ML  SOLN COMPARISON:  CTs with IV contrast 12/12/2023, 11/15/2023 and CT pelvis with contrast 03/04/2022. Pelvic ultrasound 12/12/2023. FINDINGS: Lower chest: No abnormality. Hepatobiliary: 19 cm in length liver, slightly steatotic without mass. Unremarkable gallbladder and bile ducts. Pancreas: No abnormality. Spleen: No abnormality. Adrenals/Urinary Tract: No  abnormality. Stomach/Bowel: Unremarkable contracted stomach. Normal caliber upper to mid abdominal small bowel. Unremarkable small bowel anastomosis anterior left lower abdominopelvic quadrant. Remote ileocecectomy with primary surgical anastomosis, again noted inflamed appearance in the distal ileum leading up to the anastomosis. There is interval development of an irregular rim enhancing hypodense collection in the upper pelvis just above the uterus and bladder in the midline measuring 5.5 x 5.2 x 6.8 cm partially abutting the inflamed neoterminal ileum.  Its Hounsfield density is above that of simple fluid ranging 41-47 which could indicate viscous abscess fluid, phlegmon or hematoma. There is no air in the collection. It is also possible this could communicate with 1 or more of the surrounding small bowel segments which it abuts. There are no dilated segments to suspect mechanical obstruction. The colon wall is normal thickness with mild fecal stasis. Vascular/Lymphatic: No significant vascular findings. Borderline to slightly prominent mesenteric lymph nodes are unchanged. There is a right common iliac chain node measuring 1 cm in short axis, unchanged. No new adenopathy. Reproductive: The the uterus is intact. Left ovary unremarkable. Right ovary again noted enlarged and enhancing measuring 4.8 x 3.9 cm which could indicate secondary inflammation from adjacent inflamed bowel or infection including possible tubo-ovarian abscess. It is only slightly larger than 25 days ago. Other: Mild pelvic cul-de-sac fluid is likely reactive. No other free fluid is seen. No free air or abdominal wall hernia. Musculoskeletal: No acute or significant osseous findings. IMPRESSION: 1. 5.5 x 5.2 x 6.8 cm irregular rim enhancing hypodense collection in the upper pelvis just above the uterus and bladder in the midline partially abutting the inflamed neoterminal ileum. Its Hounsfield density is above that of simple fluid which could  indicate viscous abscess fluid, phlegmon or hematoma. It is also possible this could communicate with 1 or more of the surrounding small bowel segments which it abuts. 2. Right ovary again noted enlarged and enhancing measuring 4.8 x 3.9 cm which could indicate secondary inflammation from adjacent inflamed bowel or infection including possible tubo-ovarian abscess. It is only slightly larger than 25 days ago. 3. Mild pelvic cul-de-sac fluid is likely reactive. 4. Constipation. 5. Stable borderline to slightly prominent mesenteric and right common iliac chain lymph nodes. 6. Slightly enlarged and steatotic liver. Electronically Signed   By: Francis Quam M.D.   On: 01/06/2024 02:19   IMPRESSION: Sepsis, intra-abdominal abscess as complication of below  Ileocolonic Crohn's disease              - Current CT imaging shows distal ileum inflammation to site of anastomosis - C/b adhesions requiring lysis of adhesions and small bowel resection in 2018 - IFX in distant past  - Current outpatient treatment includes oral prednisone  therapy - Fecal calprotectin 12/19/23 elevated at 5,100 Type 2 diabetes mellitus, DKA, anion gap closing Asthma  PLAN: - Pending further IR imaging/management - Continue IV antibiotic therapy  - Holding steroids in setting of intra-abdominal abscess  - Pain control per primary  - Recommend outpatient evaluation by her primary GI after discharge to determine timing of biologic therapy in setting of intra-abdominal abscess  - [facility] will follow peripherally, please call with questions    LOS: 1 day   Estefana Keas, [facility]

## 2024-01-08 ENCOUNTER — Encounter (HOSPITAL_COMMUNITY): Payer: Self-pay | Admitting: Internal Medicine

## 2024-01-08 DIAGNOSIS — K5000 Crohn's disease of small intestine without complications: Secondary | ICD-10-CM

## 2024-01-08 DIAGNOSIS — F32A Depression, unspecified: Secondary | ICD-10-CM

## 2024-01-08 DIAGNOSIS — N739 Female pelvic inflammatory disease, unspecified: Secondary | ICD-10-CM | POA: Diagnosis not present

## 2024-01-08 DIAGNOSIS — F419 Anxiety disorder, unspecified: Secondary | ICD-10-CM | POA: Diagnosis not present

## 2024-01-08 DIAGNOSIS — E1110 Type 2 diabetes mellitus with ketoacidosis without coma: Secondary | ICD-10-CM | POA: Diagnosis not present

## 2024-01-08 LAB — GLUCOSE, CAPILLARY
Glucose-Capillary: 119 mg/dL — ABNORMAL HIGH (ref 70–99)
Glucose-Capillary: 120 mg/dL — ABNORMAL HIGH (ref 70–99)
Glucose-Capillary: 128 mg/dL — ABNORMAL HIGH (ref 70–99)
Glucose-Capillary: 129 mg/dL — ABNORMAL HIGH (ref 70–99)
Glucose-Capillary: 130 mg/dL — ABNORMAL HIGH (ref 70–99)
Glucose-Capillary: 132 mg/dL — ABNORMAL HIGH (ref 70–99)
Glucose-Capillary: 132 mg/dL — ABNORMAL HIGH (ref 70–99)
Glucose-Capillary: 152 mg/dL — ABNORMAL HIGH (ref 70–99)
Glucose-Capillary: 152 mg/dL — ABNORMAL HIGH (ref 70–99)
Glucose-Capillary: 158 mg/dL — ABNORMAL HIGH (ref 70–99)
Glucose-Capillary: 160 mg/dL — ABNORMAL HIGH (ref 70–99)
Glucose-Capillary: 167 mg/dL — ABNORMAL HIGH (ref 70–99)
Glucose-Capillary: 169 mg/dL — ABNORMAL HIGH (ref 70–99)
Glucose-Capillary: 171 mg/dL — ABNORMAL HIGH (ref 70–99)
Glucose-Capillary: 179 mg/dL — ABNORMAL HIGH (ref 70–99)
Glucose-Capillary: 193 mg/dL — ABNORMAL HIGH (ref 70–99)
Glucose-Capillary: 99 mg/dL (ref 70–99)

## 2024-01-08 LAB — BETA-HYDROXYBUTYRIC ACID
Beta-Hydroxybutyric Acid: 0.06 mmol/L (ref 0.05–0.27)
Beta-Hydroxybutyric Acid: 0.28 mmol/L — ABNORMAL HIGH (ref 0.05–0.27)
Beta-Hydroxybutyric Acid: 0.59 mmol/L — ABNORMAL HIGH (ref 0.05–0.27)

## 2024-01-08 LAB — CBC
HCT: 27.3 % — ABNORMAL LOW (ref 36.0–46.0)
Hemoglobin: 7.9 g/dL — ABNORMAL LOW (ref 12.0–15.0)
MCH: 22.6 pg — ABNORMAL LOW (ref 26.0–34.0)
MCHC: 28.9 g/dL — ABNORMAL LOW (ref 30.0–36.0)
MCV: 78.2 fL — ABNORMAL LOW (ref 80.0–100.0)
Platelets: 477 10*3/uL — ABNORMAL HIGH (ref 150–400)
RBC: 3.49 MIL/uL — ABNORMAL LOW (ref 3.87–5.11)
RDW: 18.1 % — ABNORMAL HIGH (ref 11.5–15.5)
WBC: 14 10*3/uL — ABNORMAL HIGH (ref 4.0–10.5)
nRBC: 0 % (ref 0.0–0.2)

## 2024-01-08 LAB — BASIC METABOLIC PANEL WITH GFR
Anion gap: 8 (ref 5–15)
Anion gap: 7 (ref 5–15)
Anion gap: 7 (ref 5–15)
BUN: <5 mg/dL — ABNORMAL LOW (ref 6–20)
BUN: <5 mg/dL — ABNORMAL LOW (ref 6–20)
BUN: <5 mg/dL — ABNORMAL LOW (ref 6–20)
CO2: 23 mmol/L (ref 22–32)
CO2: 24 mmol/L (ref 22–32)
CO2: 24 mmol/L (ref 22–32)
Calcium: 7.8 mg/dL — ABNORMAL LOW (ref 8.9–10.3)
Calcium: 7.9 mg/dL — ABNORMAL LOW (ref 8.9–10.3)
Calcium: 8.1 mg/dL — ABNORMAL LOW (ref 8.9–10.3)
Chloride: 103 mmol/L (ref 98–111)
Chloride: 103 mmol/L (ref 98–111)
Chloride: 103 mmol/L (ref 98–111)
Creatinine, Ser: 0.7 mg/dL (ref 0.44–1.00)
Creatinine, Ser: 0.56 mg/dL (ref 0.44–1.00)
Creatinine, Ser: 0.65 mg/dL (ref 0.44–1.00)
GFR, Estimated: >60 mL/min (ref >60)
GFR, Estimated: >60 mL/min (ref >60)
GFR, Estimated: >60 mL/min (ref >60)
Glucose, Bld: 105 mg/dL — ABNORMAL HIGH (ref 70–99)
Glucose, Bld: 158 mg/dL — ABNORMAL HIGH (ref 70–99)
Glucose, Bld: 158 mg/dL — ABNORMAL HIGH (ref 70–99)
Potassium: 3.3 mmol/L — ABNORMAL LOW (ref 3.5–5.1)
Potassium: 3.4 mmol/L — ABNORMAL LOW (ref 3.5–5.1)
Potassium: 3.8 mmol/L (ref 3.5–5.1)
Sodium: 134 mmol/L — ABNORMAL LOW (ref 135–145)
Sodium: 134 mmol/L — ABNORMAL LOW (ref 135–145)
Sodium: 134 mmol/L — ABNORMAL LOW (ref 135–145)

## 2024-01-08 LAB — MAGNESIUM: Magnesium: 1.9 mg/dL (ref 1.7–2.4)

## 2024-01-08 LAB — PHOSPHORUS: Phosphorus: 2.5 mg/dL (ref 2.5–4.6)

## 2024-01-08 MED ORDER — INSULIN ASPART 100 UNIT/ML IJ SOLN
0.0000 [IU] | Freq: Three times a day (TID) | INTRAMUSCULAR | Status: DC
  Administered 2024-01-08 – 2024-01-10 (×4): 1 [IU] via SUBCUTANEOUS

## 2024-01-08 MED ORDER — DEXTROSE IN LACTATED RINGERS 5 % IV SOLN: INTRAVENOUS | Status: DC

## 2024-01-08 MED ORDER — INSULIN GLARGINE-YFGN 100 UNIT/ML ~~LOC~~ SOLN
10.0000 [IU] | Freq: Every day | SUBCUTANEOUS | Status: DC
  Filled 2024-01-08: qty 0.1

## 2024-01-08 MED ORDER — INSULIN GLARGINE-YFGN 100 UNIT/ML ~~LOC~~ SOLN
30.0000 [IU] | Freq: Every day | SUBCUTANEOUS | Status: DC
  Administered 2024-01-08 – 2024-01-11 (×4): 30 [IU] via SUBCUTANEOUS
  Filled 2024-01-08 (×4): qty 0.3

## 2024-01-08 MED ORDER — INSULIN ASPART 100 UNIT/ML IJ SOLN
3.0000 [IU] | Freq: Three times a day (TID) | INTRAMUSCULAR | Status: DC
  Administered 2024-01-08 – 2024-01-09 (×4): 3 [IU] via SUBCUTANEOUS

## 2024-01-08 MED ORDER — POTASSIUM CHLORIDE CRYS ER 20 MEQ PO TBCR
20.0000 meq | EXTENDED_RELEASE_TABLET | Freq: Once | ORAL | Status: AC
  Administered 2024-01-08: 20 meq via ORAL
  Filled 2024-01-08: qty 1

## 2024-01-08 NOTE — Progress Notes (Signed)
 [facility]  PROGRESS NOTE  Connie Williamson FMW:[id number] DOB: 10-15-1995 DOA: 01/05/2024 PCP: Paseda, Folashade R, FNP   Brief HPI:    28 y.o. female with medical history significant of asthma, hypertension, DM [name], class I obesity, type 1 diabetes, anxiety and depression, iron deficiency anemia, Crohn's disease complicated by adhesion requiring lysis and small bowel resection who presented to the emergency department complaints of abdominal pain, nausea and multiple episodes of emesis.  She has been constipated and nauseous for several days, but started vomiting yesterday.  Last month she was admitted for Crohn's disease.  Upon admission found to have pelvic/ovarian abscess, GI, general surgery, IR consulted.  Plans for aspiration by IR today    Assessment/Plan:   Diabetic ketoacidosis Uncontrolled insulin -dependent DM2 with hyperglycemia -She was transitioned to subcutaneous insulin  but was back in DKA -Endo tool with IV insulin  was restarted -DKA has resolved.  Will discontinue IV insulin  and start sliding scale insulin  NovoLog  -Also start NovoLog  3 units 3 times daily meal coverage     Pelvic abscess in female  On empiric Rocephin  and Flagyl .  Seen on the CT and ultrasound pelvic.  Recommending MRI pelvic or repeating ultrasound in 6 weeks -Status post drain placement per IR   Crohn's disease (HCC) GI team is following.  Holding off on steroids, continue antibiotics   Essential hypertension, benign IV as needed     Asthma DuoNebs as needed    Sleep apnea Given intra-abdominal issues. Hold CPAP for now.     Thrombocytosis Secondary to anemia and acute illness.     Iron deficiency anemia Monitor hematocrit hemoglobin.  Eventually can benefit from outpatient IV iron infusion referral.  I will avoid IV iron in the setting of abscess  Hypokalemia - Potassium is 3.4 - Replace potassium and follow BMP in am    Medications     Chlorhexidine Gluconate  Cloth  6 each Topical Daily   FLUoxetine   20 mg Oral Daily   sodium chloride  flush  5 mL Intracatheter Q8H     Data Reviewed:   CBG:  Recent Labs  Lab 01/08/24 0158 01/08/24 0301 01/08/24 0401 01/08/24 0507 01/08/24 0657  GLUCAP 119* 129* 160* 132* 193*    SpO2: 97 % O2 Flow Rate (L/min): 2 L/min    Vitals:   01/08/24 0000 01/08/24 0200 01/08/24 0400 01/08/24 0622  BP: 133/82 135/86 134/72 136/88  Pulse: (!) 105 100 (!) 104 (!) 104  Resp: (!) 21 13 20 16   Temp: 99.1 F (37.3 C)  99.1 F (37.3 C)   TempSrc: Oral  Oral   SpO2: 97% 98% 97% 97%  Weight:   89.7 kg   Height:          Data Reviewed:  Basic Metabolic Panel: Recent Labs  Lab 01/06/24 0532 01/06/24 1006 01/06/24 1503 01/07/24 0601 01/07/24 1117 01/07/24 1726 01/07/24 2326 01/08/24 0522  NA 135 135   < > 134* 133* 134* 134* 134*  K 3.6 3.4*   < > 3.8 3.5 3.6 3.3* 3.4*  CL 106 104   < > 101 102 100 103 103  CO2 16* 21*   < > 17* 22 22 23 24   GLUCOSE 159* 188*   < > 297* 169* 190* 105* 158*  BUN 7 6   < > 6 5* <5* <5* <5*  CREATININE 0.72 0.47   < > 0.76 0.61 0.70 0.70 0.56  CALCIUM  9.0 8.9   < > 8.6* 8.1* 8.1* 7.8* 7.9*  MG 1.5*  --   --  1.6*  --   --   --  1.9  PHOS  --  2.9  --   --   --   --   --  2.5   < > = values in this interval not displayed.    CBC: Recent Labs  Lab 01/05/24 2236 01/06/24 0532 01/07/24 0601 01/08/24 0522  WBC 30.6* 27.1* 21.0* 14.0*  NEUTROABS  --  23.8*  --   --   HGB 11.3* 10.0* 9.0* 7.9*  HCT 40.2 35.1* 31.2* 27.3*  MCV 79.9* 79.2* 78.0* 78.2*  PLT 837* 707* 603* 477*    LFT Recent Labs  Lab 01/05/24 2236 01/06/24 0532  AST <5* 7*  ALT 7 8  ALKPHOS 149* 122  BILITOT 1.4* 1.0  PROT 9.1* 7.3  ALBUMIN 3.3* 2.8*     Antibiotics: Anti-infectives (From admission, onward)    Start     Dose/Rate Route Frequency Ordered Stop   01/07/24 0000  cefTRIAXone  (ROCEPHIN ) 2 g in sodium chloride  0.9 % 100 mL IVPB        2 g 200 mL/hr over 30 Minutes  Intravenous Every 24 hours 01/06/24 0328     01/06/24 1300  metroNIDAZOLE  (FLAGYL ) IVPB 500 mg        500 mg 100 mL/hr over 60 Minutes Intravenous Every 12 hours 01/06/24 0328     01/06/24 0015  cefTRIAXone  (ROCEPHIN ) 2 g in sodium chloride  0.9 % 100 mL IVPB        2 g 200 mL/hr over 30 Minutes Intravenous Once 01/06/24 0006 01/06/24 0148   01/06/24 0015  metroNIDAZOLE  (FLAGYL ) IVPB 500 mg        500 mg 100 mL/hr over 60 Minutes Intravenous  Once 01/06/24 0006 01/06/24 0301        DVT prophylaxis: SCDs  Code Status: Full code  Family Communication: Discussed with patient's mother at bedside   CONSULTS General Surgery, IR   Subjective   Status post drain placement right lower quadrant yesterday.  Feels better this morning.   Objective    Physical Examination:   General-appears in no acute distress Heart-S1-S2, regular, no murmur auscultated Lungs-clear to auscultation bilaterally, no wheezing or crackles auscultated Abdomen-soft, mild tenderness to palpation in right lower quadrant Extremities-no edema in the lower extremities Neuro-alert, oriented x3, no focal deficit noted  Status is: Inpatient:             Connie Williamson Brod   [facility] If 7PM-7AM, please contact night-coverage at [email or web address], Office  [phone]   01/08/2024, 7:39 AM  LOS: 2 days

## 2024-01-08 NOTE — Progress Notes (Signed)
 DKA (diabetic ketoacidosis) (HCC)  Subjective: Pt feeling a bit better, nausea has improved.  Tolerating clears  Objective: Vital signs in last 24 hours: Temp:  [98.4 F (36.9 C)-100.3 F (37.9 C)] 98.4 F (36.9 C) (07/02 0800) Pulse Rate:  [100-117] 107 (07/02 1100) Resp:  [10-25] 18 (07/02 1100) BP: (108-153)/(64-102) 146/85 (07/02 1100) SpO2:  [95 %-100 %] 99 % (07/02 1100) Weight:  [89.7 kg] 89.7 kg (07/02 0400) Last BM Date :  (PTA)  Intake/Output from previous day: 07/01 0701 - 07/02 0700 In: 3260.1 [P.O.:50; I.V.:2551.5; IV Piggyback:658.6] Out: 130 [Drains:130] Intake/Output this shift: Total I/O In: 531.4 [P.O.:300; I.V.:126.4; Other:5; IV Piggyback:100] Out: 10 [Drains:10]  General appearance: alert and cooperative GI: TTP periumbilical area  Lab Results:  Results for orders placed or performed during the hospital encounter of 01/05/24 (from the past 24 hours)  Glucose, capillary     Status: Abnormal   Collection Time: 01/07/24  4:19 PM  Result Value Ref Range   Glucose-Capillary 152 (H) 70 - 99 mg/dL  Aerobic/Anaerobic Culture w Gram Stain (surgical/deep wound)     Status: None (Preliminary result)   Collection Time: 01/07/24  4:54 PM   Specimen: Abscess  Result Value Ref Range   Specimen Description      ABSCESS Performed at [facility], [address], [place], [place] [address]    Special Requests      ABDOMINAL Performed at [facility], [address] [place], [place] [address]    Gram Stain      MODERATE WBC PRESENT,BOTH PMN AND MONONUCLEAR MODERATE GRAM POSITIVE RODS FEW GRAM POSITIVE COCCI    Culture      NO GROWTH < 12 HOURS Performed at [facility], [address], [place], [place] [address]    Report Status PENDING   Glucose, capillary     Status: Abnormal   Collection Time: 01/07/24  5:18 PM  Result Value Ref Range   Glucose-Capillary 187 (H) 70 - 99 mg/dL  Basic metabolic panel      Status: Abnormal   Collection Time: 01/07/24  5:26 PM  Result Value Ref Range   Sodium 134 (L) 135 - 145 mmol/L   Potassium 3.6 3.5 - 5.1 mmol/L   Chloride 100 98 - 111 mmol/L   CO2 22 22 - 32 mmol/L   Glucose, Bld 190 (H) 70 - 99 mg/dL   BUN <5 (L) 6 - 20 mg/dL   Creatinine, Ser 9.29 0.44 - 1.00 mg/dL   Calcium  8.1 (L) 8.9 - 10.3 mg/dL   GFR, Estimated >39 >39 mL/min   Anion gap 12 5 - 15  Beta-hydroxybutyric acid     Status: Abnormal   Collection Time: 01/07/24  5:26 PM  Result Value Ref Range   Beta-Hydroxybutyric Acid 3.71 (H) 0.05 - 0.27 mmol/L  Glucose, capillary     Status: Abnormal   Collection Time: 01/07/24  6:17 PM  Result Value Ref Range   Glucose-Capillary 197 (H) 70 - 99 mg/dL  Glucose, capillary     Status: Abnormal   Collection Time: 01/07/24  7:27 PM  Result Value Ref Range   Glucose-Capillary 155 (H) 70 - 99 mg/dL  Glucose, capillary     Status: Abnormal   Collection Time: 01/07/24  8:32 PM  Result Value Ref Range   Glucose-Capillary 138 (H) 70 - 99 mg/dL   Comment 1 Notify RN    Comment 2 Document in Chart   Glucose, capillary  Status: Abnormal   Collection Time: 01/07/24  9:33 PM  Result Value Ref Range   Glucose-Capillary 156 (H) 70 - 99 mg/dL  Basic metabolic panel     Status: Abnormal   Collection Time: 01/07/24 11:26 PM  Result Value Ref Range   Sodium 134 (L) 135 - 145 mmol/L   Potassium 3.3 (L) 3.5 - 5.1 mmol/L   Chloride 103 98 - 111 mmol/L   CO2 23 22 - 32 mmol/L   Glucose, Bld 105 (H) 70 - 99 mg/dL   BUN <5 (L) 6 - 20 mg/dL   Creatinine, Ser 9.29 0.44 - 1.00 mg/dL   Calcium  7.8 (L) 8.9 - 10.3 mg/dL   GFR, Estimated >39 >39 mL/min   Anion gap 8 5 - 15  Beta-hydroxybutyric acid     Status: None   Collection Time: 01/07/24 11:26 PM  Result Value Ref Range   Beta-Hydroxybutyric Acid 0.06 0.05 - 0.27 mmol/L  Glucose, capillary     Status: None   Collection Time: 01/07/24 11:28 PM  Result Value Ref Range   Glucose-Capillary 82 70 - 99  mg/dL  Glucose, capillary     Status: None   Collection Time: 01/08/24 12:02 AM  Result Value Ref Range   Glucose-Capillary 99 70 - 99 mg/dL  Glucose, capillary     Status: Abnormal   Collection Time: 01/08/24  1:01 AM  Result Value Ref Range   Glucose-Capillary 120 (H) 70 - 99 mg/dL   Comment 1 Notify RN    Comment 2 Document in Chart   Glucose, capillary     Status: Abnormal   Collection Time: 01/08/24  1:58 AM  Result Value Ref Range   Glucose-Capillary 119 (H) 70 - 99 mg/dL   Comment 1 Notify RN    Comment 2 Document in Chart   Glucose, capillary     Status: Abnormal   Collection Time: 01/08/24  3:01 AM  Result Value Ref Range   Glucose-Capillary 129 (H) 70 - 99 mg/dL  Glucose, capillary     Status: Abnormal   Collection Time: 01/08/24  4:01 AM  Result Value Ref Range   Glucose-Capillary 160 (H) 70 - 99 mg/dL   Comment 1 Notify RN    Comment 2 Document in Chart   Glucose, capillary     Status: Abnormal   Collection Time: 01/08/24  5:07 AM  Result Value Ref Range   Glucose-Capillary 132 (H) 70 - 99 mg/dL  Phosphorus     Status: None   Collection Time: 01/08/24  5:22 AM  Result Value Ref Range   Phosphorus 2.5 2.5 - 4.6 mg/dL  Basic metabolic panel     Status: Abnormal   Collection Time: 01/08/24  5:22 AM  Result Value Ref Range   Sodium 134 (L) 135 - 145 mmol/L   Potassium 3.4 (L) 3.5 - 5.1 mmol/L   Chloride 103 98 - 111 mmol/L   CO2 24 22 - 32 mmol/L   Glucose, Bld 158 (H) 70 - 99 mg/dL   BUN <5 (L) 6 - 20 mg/dL   Creatinine, Ser 9.43 0.44 - 1.00 mg/dL   Calcium  7.9 (L) 8.9 - 10.3 mg/dL   GFR, Estimated >39 >39 mL/min   Anion gap 7 5 - 15  Beta-hydroxybutyric acid     Status: Abnormal   Collection Time: 01/08/24  5:22 AM  Result Value Ref Range   Beta-Hydroxybutyric Acid 0.28 (H) 0.05 - 0.27 mmol/L  CBC     Status: Abnormal  Collection Time: 01/08/24  5:22 AM  Result Value Ref Range   WBC 14.0 (H) 4.0 - 10.5 K/uL   RBC 3.49 (L) 3.87 - 5.11 MIL/uL    Hemoglobin 7.9 (L) 12.0 - 15.0 g/dL   HCT 72.6 (L) 63.9 - 53.9 %   MCV 78.2 (L) 80.0 - 100.0 fL   MCH 22.6 (L) 26.0 - 34.0 pg   MCHC 28.9 (L) 30.0 - 36.0 g/dL   RDW 81.8 (H) 88.4 - 84.4 %   Platelets 477 (H) 150 - 400 K/uL   nRBC 0.0 0.0 - 0.2 %  Magnesium     Status: None   Collection Time: 01/08/24  5:22 AM  Result Value Ref Range   Magnesium 1.9 1.7 - 2.4 mg/dL  Glucose, capillary     Status: Abnormal   Collection Time: 01/08/24  6:57 AM  Result Value Ref Range   Glucose-Capillary 193 (H) 70 - 99 mg/dL  Glucose, capillary     Status: Abnormal   Collection Time: 01/08/24  7:59 AM  Result Value Ref Range   Glucose-Capillary 158 (H) 70 - 99 mg/dL  Glucose, capillary     Status: Abnormal   Collection Time: 01/08/24  9:03 AM  Result Value Ref Range   Glucose-Capillary 179 (H) 70 - 99 mg/dL  Glucose, capillary     Status: Abnormal   Collection Time: 01/08/24  9:59 AM  Result Value Ref Range   Glucose-Capillary 167 (H) 70 - 99 mg/dL  Glucose, capillary     Status: Abnormal   Collection Time: 01/08/24 10:59 AM  Result Value Ref Range   Glucose-Capillary 132 (H) 70 - 99 mg/dL  Glucose, capillary     Status: Abnormal   Collection Time: 01/08/24 12:14 PM  Result Value Ref Range   Glucose-Capillary 152 (H) 70 - 99 mg/dL  Basic metabolic panel     Status: Abnormal   Collection Time: 01/08/24 12:15 PM  Result Value Ref Range   Sodium 134 (L) 135 - 145 mmol/L   Potassium 3.8 3.5 - 5.1 mmol/L   Chloride 103 98 - 111 mmol/L   CO2 24 22 - 32 mmol/L   Glucose, Bld 158 (H) 70 - 99 mg/dL   BUN <5 (L) 6 - 20 mg/dL   Creatinine, Ser 9.34 0.44 - 1.00 mg/dL   Calcium  8.1 (L) 8.9 - 10.3 mg/dL   GFR, Estimated >39 >39 mL/min   Anion gap 7 5 - 15  Glucose, capillary     Status: Abnormal   Collection Time: 01/08/24  1:24 PM  Result Value Ref Range   Glucose-Capillary 171 (H) 70 - 99 mg/dL  Glucose, capillary     Status: Abnormal   Collection Time: 01/08/24  2:22 PM  Result Value Ref  Range   Glucose-Capillary 169 (H) 70 - 99 mg/dL     Studies/Results Radiology     MEDS, Scheduled  Chlorhexidine Gluconate Cloth  6 each Topical Daily   FLUoxetine   20 mg Oral Daily   insulin  aspart  0-9 Units Subcutaneous TID WC   insulin  aspart  3 Units Subcutaneous TID WC   insulin  glargine-yfgn  30 Units Subcutaneous Daily   sodium chloride  flush  5 mL Intracatheter Q8H     Assessment: DKA (diabetic ketoacidosis) (HCC) Crohn's disease with abscess  Plan: Cont drain and clears.  Will begin to advance diet tomorrow if she is still doing well IV antibiotics seem to be working well, wbc down, symptoms improving No plans for immediate surgery  at the moement   LOS: 2 days    [name], MD [facility], [place]  Moderate decision making   01/08/2024 3:32 PM

## 2024-01-08 NOTE — Plan of Care (Signed)
  Problem: Education: Goal: Knowledge of General Education information will improve Description: Including pain rating scale, medication(s)/side effects and non-pharmacologic comfort measures Outcome: Progressing   Problem: Clinical Measurements: Goal: Ability to maintain clinical measurements within normal limits will improve Outcome: Progressing Goal: Diagnostic test results will improve Outcome: Progressing   Problem: Nutrition: Goal: Adequate nutrition will be maintained Outcome: Progressing   Problem: Coping: Goal: Level of anxiety will decrease Outcome: Progressing   Problem: Safety: Goal: Ability to remain free from injury will improve Outcome: Progressing   Problem: Skin Integrity: Goal: Risk for impaired skin integrity will decrease Outcome: Progressing   Problem: Metabolic: Goal: Ability to maintain appropriate glucose levels will improve Outcome: Progressing

## 2024-01-08 NOTE — Progress Notes (Signed)
 Chief Complaint: Patient was seen today for drain check  Supervising Physician: Philip Cornet  Patient Status: [facility] - In-pt  Subjective: S/p RLQ drain placed yesterday for Crohn's related abscess  Objective: Physical Exam: BP (!) 146/85 (BP Location: Right Arm)   Pulse (!) 107   Temp 98.4 F (36.9 C) (Oral)   Resp 18   Ht 5' 3 (1.6 m)   Wt 197 lb 12 oz (89.7 kg)   SpO2 99%   BMI 35.03 kg/m  RLQ drain intact, site clean. Some oozing from skin reported early. Dressing does have some dry blood noted, no fresh blood seen. Drain with some purulent output.   Current Facility-Administered Medications:    acetaminophen  (TYLENOL ) tablet 650 mg, 650 mg, Oral, Q6H PRN, 650 mg at 01/07/24 1757 **OR** acetaminophen  (TYLENOL ) suppository 650 mg, 650 mg, Rectal, Q6H PRN, Howerter, Justin B, DO   cefTRIAXone  (ROCEPHIN ) 2 g in sodium chloride  0.9 % 100 mL IVPB, 2 g, Intravenous, Q24H, Howerter, Justin B, DO, Stopped at 01/08/24 0000   Chlorhexidine Gluconate Cloth 2 % PADS 6 each, 6 each, Topical, Daily, Howerter, Justin B, DO, 6 each at 01/08/24 1057   dextrose 50 % solution 0-50 mL, 0-50 mL, Intravenous, PRN, McCauley, Larry B, PA-C   FLUoxetine  (PROZAC ) capsule 20 mg, 20 mg, Oral, Daily, Celinda Alm Lot, MD, 20 mg at 01/08/24 1030   hydrALAZINE (APRESOLINE) injection 10 mg, 10 mg, Intravenous, Q4H PRN, Amin, Ankit C, MD   HYDROmorphone  (DILAUDID ) injection 0.5 mg, 0.5 mg, Intravenous, Q2H PRN, Howerter, Justin B, DO, 0.5 mg at 01/08/24 0544   insulin  aspart (novoLOG ) injection 0-9 Units, 0-9 Units, Subcutaneous, TID WC, Drusilla, Sabas RAMAN, MD   insulin  glargine-yfgn (SEMGLEE ) injection 10 Units, 10 Units, Subcutaneous, Daily, Drusilla, Sabas RAMAN, MD   ipratropium-albuterol  (DUONEB) 0.5-2.5 (3) MG/3ML nebulizer solution 3 mL, 3 mL, Nebulization, Q4H PRN, Amin, Ankit C, MD   metoprolol tartrate (LOPRESSOR) injection 5 mg, 5 mg, Intravenous, Q4H PRN, Amin, Ankit C, MD   metroNIDAZOLE  (FLAGYL ) IVPB  500 mg, 500 mg, Intravenous, Q12H, Howerter, Justin B, DO, Stopped at 01/08/24 1324   naloxone  (NARCAN ) injection 0.4 mg, 0.4 mg, Intravenous, PRN, Howerter, Justin B, DO   ondansetron  (ZOFRAN ) injection 4 mg, 4 mg, Intravenous, Q6H PRN, Howerter, Justin B, DO, 4 mg at 01/07/24 1429   Oral care mouth rinse, 15 mL, Mouth Rinse, PRN, Howerter, Justin B, DO   prochlorperazine (COMPAZINE) injection 10 mg, 10 mg, Intravenous, Q6H PRN, Amin, Ankit C, MD, 10 mg at 01/07/24 1613   promethazine (PHENERGAN) 12.5 mg in sodium chloride  0.9 % 50 mL IVPB, 12.5 mg, Intravenous, Q6H PRN, Howerter, Justin B, DO, Stopped at 01/07/24 1309   sodium chloride  flush (NS) 0.9 % injection 10-40 mL, 10-40 mL, Intracatheter, PRN, Countryman, Chase, MD   sodium chloride  flush (NS) 0.9 % injection 5 mL, 5 mL, Intracatheter, Q8H, Suttle, Dylan J, MD, 5 mL at 01/08/24 1332  Labs: CBC Recent Labs    01/07/24 0601 01/08/24 0522  WBC 21.0* 14.0*  HGB 9.0* 7.9*  HCT 31.2* 27.3*  PLT 603* 477*   BMET Recent Labs    01/08/24 0522 01/08/24 1215  NA 134* 134*  K 3.4* 3.8  CL 103 103  CO2 24 24  GLUCOSE 158* 158*  BUN <5* <5*  CREATININE 0.56 0.65  CALCIUM  7.9* 8.1*   LFT Recent Labs    01/05/24 2236 01/06/24 0532  PROT 9.1* 7.3  ALBUMIN 3.3* 2.8*  AST <5*  7*  ALT 7 8  ALKPHOS 149* 122  BILITOT 1.4* 1.0  LIPASE 24  --    PT/INR No results for input(s): LABPROT, INR in the last 72 hours.   Studies/Results: CT GUIDED PERITONEAL/RETROPERITONEAL FLUID DRAIN BY PERC CATH Result Date: 01/07/2024 INDICATION: 28 year old female with history of ileocolonic anastomosis complicated by postoperative abscess formation. EXAM: CT PERC DRAIN PERITONEAL ABCESS COMPARISON:  01/06/2024 MEDICATIONS: The patient is currently admitted to the hospital and receiving intravenous antibiotics. The antibiotics were administered within an appropriate time frame prior to the initiation of the procedure. ANESTHESIA/SEDATION:  Moderate (conscious) sedation was employed during this procedure. A total of Versed 2 mg and Fentanyl  50 mcg was administered intravenously. Moderate Sedation Time: 14 minutes. The patient's level of consciousness and vital signs were monitored continuously by radiology nursing throughout the procedure under my direct supervision. CONTRAST:  100 mL Omnipaque  300, intravenous COMPLICATIONS: None immediate. PROCEDURE: RADIATION DOSE REDUCTION: This exam was performed according to the departmental dose-optimization program which includes automated exposure control, adjustment of the mA and/or kV according to patient size and/or use of iterative reconstruction technique. Informed written consent was obtained from the patient after a discussion of the risks, benefits and alternatives to treatment. The patient was placed supine on the CT gantry and a pre procedural CT was performed re-demonstrating the known abscess/fluid collection within the lower abdomen. The procedure was planned. A timeout was performed prior to the initiation of the procedure. The right lower quadrant was prepped and draped in the usual sterile fashion. The overlying soft tissues were anesthetized with 1% lidocaine  with epinephrine . Appropriate trajectory was planned with the use of a 22 gauge spinal needle. An 18 gauge trocar needle was advanced into the abscess/fluid collection and a short Amplatz super stiff wire was coiled within the collection. Appropriate positioning was confirmed with a limited CT scan. The tract was serially dilated allowing placement of a 10 [place] all-purpose drainage catheter. Appropriate positioning was confirmed with a limited postprocedural CT scan. Approximately 90 ml of purulent fluid was aspirated. The tube was connected to a bulb suction and sutured in place. A dressing was placed. The patient tolerated the procedure well without immediate post procedural complication. IMPRESSION: Successful CT guided placement of  a 10 French all purpose drain catheter into the lower abdominal abscess with aspiration of 90 mL of purulent fluid. Samples were sent to the laboratory as requested by the ordering clinical team. Ester Sides, MD [facility] Radiology Electronically Signed   By: Ester Sides M.D.   On: 01/07/2024 17:20    Assessment/Plan: S/p RLQ perc abscess drain Cont flush/drain care. IR following    LOS: 2 days   I spent a total of 20 minutes in face to face in clinical consultation, greater than 50% of which was counseling/coordinating care for RLQ abscess drain  Franky Rusk PA-C 01/08/2024 1:48 PM

## 2024-01-08 NOTE — Plan of Care (Signed)
  Problem: Clinical Measurements: Goal: Diagnostic test results will improve Outcome: Not Progressing   Problem: Activity: Goal: Risk for activity intolerance will decrease Outcome: Not Progressing   Problem: Nutrition: Goal: Adequate nutrition will be maintained Outcome: Not Progressing   Problem: Pain Managment: Goal: General experience of comfort will improve and/or be controlled Outcome: Not Progressing   Problem: Metabolic: Goal: Ability to maintain appropriate glucose levels will improve Outcome: Not Progressing   Problem: Metabolic: Goal: Ability to maintain appropriate glucose levels will improve Outcome: Not Progressing   Problem: Nutritional: Goal: Maintenance of adequate nutrition will improve Outcome: Not Progressing Goal: Maintenance of adequate weight for body size and type will improve Outcome: Not Progressing

## 2024-01-09 DIAGNOSIS — K5000 Crohn's disease of small intestine without complications: Secondary | ICD-10-CM | POA: Diagnosis not present

## 2024-01-09 DIAGNOSIS — E1110 Type 2 diabetes mellitus with ketoacidosis without coma: Secondary | ICD-10-CM | POA: Diagnosis not present

## 2024-01-09 DIAGNOSIS — F419 Anxiety disorder, unspecified: Secondary | ICD-10-CM | POA: Diagnosis not present

## 2024-01-09 DIAGNOSIS — N739 Female pelvic inflammatory disease, unspecified: Secondary | ICD-10-CM | POA: Diagnosis not present

## 2024-01-09 LAB — GLUCOSE, CAPILLARY
Glucose-Capillary: 105 mg/dL — ABNORMAL HIGH (ref 70–99)
Glucose-Capillary: 123 mg/dL — ABNORMAL HIGH (ref 70–99)
Glucose-Capillary: 142 mg/dL — ABNORMAL HIGH (ref 70–99)
Glucose-Capillary: 92 mg/dL (ref 70–99)

## 2024-01-09 LAB — BASIC METABOLIC PANEL WITH GFR
Anion gap: 9 (ref 5–15)
BUN: <5 mg/dL — ABNORMAL LOW (ref 6–20)
CO2: 25 mmol/L (ref 22–32)
Calcium: 7.9 mg/dL — ABNORMAL LOW (ref 8.9–10.3)
Chloride: 104 mmol/L (ref 98–111)
Creatinine, Ser: 0.53 mg/dL (ref 0.44–1.00)
GFR, Estimated: >60 mL/min (ref >60)
Glucose, Bld: 93 mg/dL (ref 70–99)
Potassium: 3.4 mmol/L — ABNORMAL LOW (ref 3.5–5.1)
Sodium: 138 mmol/L (ref 135–145)

## 2024-01-09 LAB — CBC
HCT: 25.4 % — ABNORMAL LOW (ref 36.0–46.0)
Hemoglobin: 7 g/dL — ABNORMAL LOW (ref 12.0–15.0)
MCH: 22.2 pg — ABNORMAL LOW (ref 26.0–34.0)
MCHC: 27.6 g/dL — ABNORMAL LOW (ref 30.0–36.0)
MCV: 80.6 fL (ref 80.0–100.0)
Platelets: 455 10*3/uL — ABNORMAL HIGH (ref 150–400)
RBC: 3.15 MIL/uL — ABNORMAL LOW (ref 3.87–5.11)
RDW: 18.6 % — ABNORMAL HIGH (ref 11.5–15.5)
WBC: 11.5 10*3/uL — ABNORMAL HIGH (ref 4.0–10.5)
nRBC: 0 % (ref 0.0–0.2)

## 2024-01-09 MED ORDER — POTASSIUM CHLORIDE CRYS ER 20 MEQ PO TBCR
40.0000 meq | EXTENDED_RELEASE_TABLET | Freq: Once | ORAL | Status: AC
  Administered 2024-01-09: 40 meq via ORAL
  Filled 2024-01-09: qty 2

## 2024-01-09 MED ORDER — FUROSEMIDE 40 MG PO TABS
40.0000 mg | ORAL_TABLET | Freq: Every day | ORAL | Status: AC
  Administered 2024-01-09: 40 mg via ORAL
  Filled 2024-01-09: qty 1

## 2024-01-09 NOTE — Progress Notes (Signed)
 DKA (diabetic ketoacidosis) (HCC)  Subjective: Pt feeling better, Tolerating solid diet  Objective: Vital signs in last 24 hours: Temp:  [98.2 F (36.8 C)-99.7 F (37.6 C)] 98.7 F (37.1 C) (07/03 0800) Pulse Rate:  [106-109] 106 (07/02 1815) Resp:  [13-18] 17 (07/02 1815) BP: (113-146)/(67-85) 113/67 (07/02 1815) SpO2:  [98 %-99 %] 99 % (07/02 1815) Last BM Date : 01/08/24  Intake/Output from previous day: 07/02 0701 - 07/03 0700 In: 884.9 [P.O.:500; I.V.:274.9; IV Piggyback:100] Out: 30 [Drains:30] Intake/Output this shift: No intake/output data recorded.  General appearance: alert and cooperative GI: no TTP Drain with bile stained, fibrinopurulent fluid Lab Results:  Results for orders placed or performed during the hospital encounter of 01/05/24 (from the past 24 hours)  Glucose, capillary     Status: Abnormal   Collection Time: 01/08/24  9:03 AM  Result Value Ref Range   Glucose-Capillary 179 (H) 70 - 99 mg/dL  Glucose, capillary     Status: Abnormal   Collection Time: 01/08/24  9:59 AM  Result Value Ref Range   Glucose-Capillary 167 (H) 70 - 99 mg/dL  Glucose, capillary     Status: Abnormal   Collection Time: 01/08/24 10:59 AM  Result Value Ref Range   Glucose-Capillary 132 (H) 70 - 99 mg/dL  Glucose, capillary     Status: Abnormal   Collection Time: 01/08/24 12:14 PM  Result Value Ref Range   Glucose-Capillary 152 (H) 70 - 99 mg/dL  Basic metabolic panel     Status: Abnormal   Collection Time: 01/08/24 12:15 PM  Result Value Ref Range   Sodium 134 (L) 135 - 145 mmol/L   Potassium 3.8 3.5 - 5.1 mmol/L   Chloride 103 98 - 111 mmol/L   CO2 24 22 - 32 mmol/L   Glucose, Bld 158 (H) 70 - 99 mg/dL   BUN <5 (L) 6 - 20 mg/dL   Creatinine, Ser 9.34 0.44 - 1.00 mg/dL   Calcium  8.1 (L) 8.9 - 10.3 mg/dL   GFR, Estimated >39 >39 mL/min   Anion gap 7 5 - 15  Beta-hydroxybutyric acid     Status: Abnormal   Collection Time: 01/08/24 12:15 PM  Result Value Ref Range    Beta-Hydroxybutyric Acid 0.59 (H) 0.05 - 0.27 mmol/L  Glucose, capillary     Status: Abnormal   Collection Time: 01/08/24  1:24 PM  Result Value Ref Range   Glucose-Capillary 171 (H) 70 - 99 mg/dL  Glucose, capillary     Status: Abnormal   Collection Time: 01/08/24  2:22 PM  Result Value Ref Range   Glucose-Capillary 169 (H) 70 - 99 mg/dL  Glucose, capillary     Status: Abnormal   Collection Time: 01/08/24  5:36 PM  Result Value Ref Range   Glucose-Capillary 130 (H) 70 - 99 mg/dL  Glucose, capillary     Status: Abnormal   Collection Time: 01/08/24  9:11 PM  Result Value Ref Range   Glucose-Capillary 128 (H) 70 - 99 mg/dL   Comment 1 Notify RN    Comment 2 Document in Chart   CBC     Status: Abnormal   Collection Time: 01/09/24  5:52 AM  Result Value Ref Range   WBC 11.5 (H) 4.0 - 10.5 K/uL   RBC 3.15 (L) 3.87 - 5.11 MIL/uL   Hemoglobin 7.0 (L) 12.0 - 15.0 g/dL   HCT 74.5 (L) 63.9 - 53.9 %   MCV 80.6 80.0 - 100.0 fL   MCH 22.2 (L) 26.0 -  34.0 pg   MCHC 27.6 (L) 30.0 - 36.0 g/dL   RDW 81.3 (H) 88.4 - 84.4 %   Platelets 455 (H) 150 - 400 K/uL   nRBC 0.0 0.0 - 0.2 %  Glucose, capillary     Status: Abnormal   Collection Time: 01/09/24  7:38 AM  Result Value Ref Range   Glucose-Capillary 105 (H) 70 - 99 mg/dL   Comment 1 Document in Chart      Studies/Results Radiology     MEDS, Scheduled  Chlorhexidine Gluconate Cloth  6 each Topical Daily   FLUoxetine   20 mg Oral Daily   insulin  aspart  0-9 Units Subcutaneous TID WC   insulin  aspart  3 Units Subcutaneous TID WC   insulin  glargine-yfgn  30 Units Subcutaneous Daily   sodium chloride  flush  5 mL Intracatheter Q8H     Assessment: DKA (diabetic ketoacidosis) (HCC) Crohn's disease with abscess  Plan: Cont drain and diet as tolerated. Seems to be improving Discussed f/u care with the patient.  If the patient would like to continue care in [place], I am happy to see her as an outpatient in several months once her  flare settles down.  If she would like to continue care at [facility], her GI doctor can refer her to a surgeon in their system   LOS: 3 days    [name], MD [facility], [place]  Moderate decision making   01/09/2024 8:57 AM

## 2024-01-09 NOTE — Progress Notes (Signed)
 [facility]  PROGRESS NOTE  Connie Williamson FMW:[id number] DOB: 1996/05/05 DOA: 01/05/2024 PCP: Paseda, Folashade R, FNP   Brief HPI:    28 y.o. female with medical history significant of asthma, hypertension, DM [name], class I obesity, type 1 diabetes, anxiety and depression, iron deficiency anemia, Crohn's disease complicated by adhesion requiring lysis and small bowel resection who presented to the emergency department complaints of abdominal pain, nausea and multiple episodes of emesis.  She has been constipated and nauseous for several days, but started vomiting yesterday.  Last month she was admitted for Crohn's disease.  Upon admission found to have pelvic/ovarian abscess, GI, general surgery, IR consulted.  Plans for aspiration by IR today    Assessment/Plan:   Diabetic ketoacidosis -Resolved Uncontrolled insulin -dependent DM2 with hyperglycemia; -She was transitioned to subcutaneous insulin  but was back in DKA -Endo tool with IV insulin  was restarted -DKA has resolved.  IV insulin  has been discontinued  -Also start NovoLog  3 units 3 times daily meal coverage     Pelvic abscess in female  On empiric Rocephin  and Flagyl .  Seen on the CT and ultrasound pelvic.  Recommending MRI pelvic or repeating ultrasound in 6 weeks -Status post drain placement per IR -Currently on ceftriaxone  and Flagyl  -Abscess culture is pending   Crohn's disease (HCC) GI team is following.  Holding off on steroids, continue antibiotics   Essential hypertension, benign IV as needed     Asthma DuoNebs as needed    Sleep apnea Given intra-abdominal issues. Hold CPAP for now.     Thrombocytosis Secondary to anemia and acute illness.     Iron deficiency anemia Monitor hematocrit hemoglobin.  Eventually can benefit from outpatient IV iron infusion referral.  I will avoid IV iron in the setting of abscess  Hypokalemia - Potassium is 3.4 - Replace potassium and follow BMP in  am    Medications     Chlorhexidine Gluconate Cloth  6 each Topical Daily   FLUoxetine   20 mg Oral Daily   insulin  aspart  0-9 Units Subcutaneous TID WC   insulin  aspart  3 Units Subcutaneous TID WC   insulin  glargine-yfgn  30 Units Subcutaneous Daily   sodium chloride  flush  5 mL Intracatheter Q8H     Data Reviewed:   CBG:  Recent Labs  Lab 01/08/24 1422 01/08/24 1736 01/08/24 2111 01/09/24 0738 01/09/24 1254  GLUCAP 169* 130* 128* 105* 123*    SpO2: 100 % O2 Flow Rate (L/min): 2 L/min    Vitals:   01/09/24 0800 01/09/24 0900 01/09/24 1000 01/09/24 1215  BP:   109/70   Pulse: 97 (!) 104 (!) 103   Resp: 16 15 18    Temp: 98.7 F (37.1 C)   98.5 F (36.9 C)  TempSrc: Oral   Oral  SpO2: 96% 98% 100%   Weight:      Height:          Data Reviewed:  Basic Metabolic Panel: Recent Labs  Lab 01/06/24 0532 01/06/24 1006 01/06/24 1503 01/07/24 0601 01/07/24 1117 01/07/24 1726 01/07/24 2326 01/08/24 0522 01/08/24 1215 01/09/24 0552  NA 135 135   < > 134*   < > 134* 134* 134* 134* 138  K 3.6 3.4*   < > 3.8   < > 3.6 3.3* 3.4* 3.8 3.4*  CL 106 104   < > 101   < > 100 103 103 103 104  CO2 16* 21*   < > 17*   < > 22 23  24 24 25   GLUCOSE 159* 188*   < > 297*   < > 190* 105* 158* 158* 93  BUN 7 6   < > 6   < > <5* <5* <5* <5* <5*  CREATININE 0.72 0.47   < > 0.76   < > 0.70 0.70 0.56 0.65 0.53  CALCIUM  9.0 8.9   < > 8.6*   < > 8.1* 7.8* 7.9* 8.1* 7.9*  MG 1.5*  --   --  1.6*  --   --   --  1.9  --   --   PHOS  --  2.9  --   --   --   --   --  2.5  --   --    < > = values in this interval not displayed.    CBC: Recent Labs  Lab 01/05/24 2236 01/06/24 0532 01/07/24 0601 01/08/24 0522 01/09/24 0552  WBC 30.6* 27.1* 21.0* 14.0* 11.5*  NEUTROABS  --  23.8*  --   --   --   HGB 11.3* 10.0* 9.0* 7.9* 7.0*  HCT 40.2 35.1* 31.2* 27.3* 25.4*  MCV 79.9* 79.2* 78.0* 78.2* 80.6  PLT 837* 707* 603* 477* 455*    LFT Recent Labs  Lab 01/05/24 2236  01/06/24 0532  AST <5* 7*  ALT 7 8  ALKPHOS 149* 122  BILITOT 1.4* 1.0  PROT 9.1* 7.3  ALBUMIN 3.3* 2.8*     Antibiotics: Anti-infectives (From admission, onward)    Start     Dose/Rate Route Frequency Ordered Stop   01/07/24 0000  cefTRIAXone  (ROCEPHIN ) 2 g in sodium chloride  0.9 % 100 mL IVPB        2 g 200 mL/hr over 30 Minutes Intravenous Every 24 hours 01/06/24 0328     01/06/24 1300  metroNIDAZOLE  (FLAGYL ) IVPB 500 mg        500 mg 100 mL/hr over 60 Minutes Intravenous Every 12 hours 01/06/24 0328     01/06/24 0015  cefTRIAXone  (ROCEPHIN ) 2 g in sodium chloride  0.9 % 100 mL IVPB        2 g 200 mL/hr over 30 Minutes Intravenous Once 01/06/24 0006 01/06/24 0148   01/06/24 0015  metroNIDAZOLE  (FLAGYL ) IVPB 500 mg        500 mg 100 mL/hr over 60 Minutes Intravenous  Once 01/06/24 0006 01/06/24 0301        DVT prophylaxis: SCDs  Code Status: Full code  Family Communication: Discussed with patient's mother at bedside   CONSULTS General Surgery, IR   Subjective   Denies pain.   Objective    Physical Examination:   General-appears in no acute distress Heart-S1-S2, regular, no murmur auscultated Lungs-clear to auscultation bilaterally, no wheezing or crackles auscultated Abdomen-soft, mild tenderness in right lower quadrant Extremities-no edema in the lower extremities Neuro-alert, oriented x3, no focal deficit noted  Status is: Inpatient:             Sabas GORMAN Brod   [facility] If 7PM-7AM, please contact night-coverage at [email or web address], Office  [phone]   01/09/2024, 2:48 PM  LOS: 3 days

## 2024-01-10 ENCOUNTER — Encounter (HOSPITAL_COMMUNITY): Payer: Self-pay | Admitting: Internal Medicine

## 2024-01-10 DIAGNOSIS — K5000 Crohn's disease of small intestine without complications: Secondary | ICD-10-CM | POA: Diagnosis not present

## 2024-01-10 DIAGNOSIS — N739 Female pelvic inflammatory disease, unspecified: Secondary | ICD-10-CM | POA: Diagnosis not present

## 2024-01-10 DIAGNOSIS — E1110 Type 2 diabetes mellitus with ketoacidosis without coma: Secondary | ICD-10-CM | POA: Diagnosis not present

## 2024-01-10 DIAGNOSIS — F419 Anxiety disorder, unspecified: Secondary | ICD-10-CM | POA: Diagnosis not present

## 2024-01-10 LAB — GLUCOSE, CAPILLARY
Glucose-Capillary: 79 mg/dL (ref 70–99)
Glucose-Capillary: 107 mg/dL — ABNORMAL HIGH (ref 70–99)
Glucose-Capillary: 131 mg/dL — ABNORMAL HIGH (ref 70–99)
Glucose-Capillary: 158 mg/dL — ABNORMAL HIGH (ref 70–99)

## 2024-01-10 LAB — AEROBIC/ANAEROBIC CULTURE W GRAM STAIN (SURGICAL/DEEP WOUND)

## 2024-01-10 LAB — HEMOGLOBIN AND HEMATOCRIT, BLOOD
HCT: 30.4 % — ABNORMAL LOW (ref 36.0–46.0)
Hemoglobin: 8.9 g/dL — ABNORMAL LOW (ref 12.0–15.0)

## 2024-01-10 LAB — CBC
HCT: 23.7 % — ABNORMAL LOW (ref 36.0–46.0)
Hemoglobin: 6.6 g/dL — CL (ref 12.0–15.0)
MCH: 22.4 pg — ABNORMAL LOW (ref 26.0–34.0)
MCHC: 27.8 g/dL — ABNORMAL LOW (ref 30.0–36.0)
MCV: 80.6 fL (ref 80.0–100.0)
Platelets: 420 K/uL — ABNORMAL HIGH (ref 150–400)
RBC: 2.94 MIL/uL — ABNORMAL LOW (ref 3.87–5.11)
RDW: 19 % — ABNORMAL HIGH (ref 11.5–15.5)
WBC: 10 K/uL (ref 4.0–10.5)
nRBC: 0 % (ref 0.0–0.2)

## 2024-01-10 LAB — BASIC METABOLIC PANEL WITH GFR
Anion gap: 11 (ref 5–15)
BUN: <5 mg/dL — ABNORMAL LOW (ref 6–20)
CO2: 25 mmol/L (ref 22–32)
Calcium: 8 mg/dL — ABNORMAL LOW (ref 8.9–10.3)
Chloride: 99 mmol/L (ref 98–111)
Creatinine, Ser: 0.46 mg/dL (ref 0.44–1.00)
GFR, Estimated: >60 mL/min (ref >60)
Glucose, Bld: 79 mg/dL (ref 70–99)
Potassium: 3.6 mmol/L (ref 3.5–5.1)
Sodium: 135 mmol/L (ref 135–145)

## 2024-01-10 LAB — ABO/RH: ABO/RH(D): O POS

## 2024-01-10 LAB — PREPARE RBC (CROSSMATCH)

## 2024-01-10 MED ORDER — FUROSEMIDE 40 MG PO TABS
40.0000 mg | ORAL_TABLET | Freq: Every day | ORAL | Status: AC
  Administered 2024-01-10: 40 mg via ORAL
  Filled 2024-01-10: qty 1

## 2024-01-10 MED ORDER — SODIUM CHLORIDE 0.9% IV SOLUTION: Freq: Once | INTRAVENOUS | Status: AC

## 2024-01-10 NOTE — Progress Notes (Signed)
   Subjective/Chief Complaint: No complaints   Objective: Vital signs in last 24 hours: Temp:  [98 F (36.7 C)-98.9 F (37.2 C)] 98 F (36.7 C) (07/04 0522) Pulse Rate:  [98-111] 100 (07/04 0522) Resp:  [8-27] 19 (07/04 0522) BP: (109-152)/(67-99) 114/67 (07/04 0522) SpO2:  [93 %-100 %] 97 % (07/04 0522) Weight:  [90.4 kg] 90.4 kg (07/04 0500) Last BM Date : 01/09/24  Intake/Output from previous day: 07/03 0701 - 07/04 0700 In: 10  Out: 35 [Drains:35] Intake/Output this shift: No intake/output data recorded.  General appearance: alert and cooperative Resp: clear to auscultation bilaterally Cardio: regular rate and rhythm GI: soft, nontender  Lab Results:  Recent Labs    01/09/24 0552 01/10/24 0534  WBC 11.5* 10.0  HGB 7.0* 6.6*  HCT 25.4* 23.7*  PLT 455* 420*   BMET Recent Labs    01/09/24 0552 01/10/24 0534  NA 138 135  K 3.4* 3.6  CL 104 99  CO2 25 25  GLUCOSE 93 79  BUN <5* <5*  CREATININE 0.53 0.46  CALCIUM  7.9* 8.0*   PT/INR No results for input(s): LABPROT, INR in the last 72 hours. ABG No results for input(s): PHART, HCO3 in the last 72 hours.  Invalid input(s): PCO2, PO2  Studies/Results: No results found.  Anti-infectives: Anti-infectives (From admission, onward)    Start     Dose/Rate Route Frequency Ordered Stop   01/07/24 0000  cefTRIAXone  (ROCEPHIN ) 2 g in sodium chloride  0.9 % 100 mL IVPB        2 g 200 mL/hr over 30 Minutes Intravenous Every 24 hours 01/06/24 0328     01/06/24 1300  metroNIDAZOLE  (FLAGYL ) IVPB 500 mg        500 mg 100 mL/hr over 60 Minutes Intravenous Every 12 hours 01/06/24 0328     01/06/24 0015  cefTRIAXone  (ROCEPHIN ) 2 g in sodium chloride  0.9 % 100 mL IVPB        2 g 200 mL/hr over 30 Minutes Intravenous Once 01/06/24 0006 01/06/24 0148   01/06/24 0015  metroNIDAZOLE  (FLAGYL ) IVPB 500 mg        500 mg 100 mL/hr over 60 Minutes Intravenous  Once 01/06/24 0006 01/06/24 0301        Assessment/Plan: s/p * No surgery found * Crohn's per GI Continue drain Can follow up with Dr. Debby after d/c No acute surgical issues. Will sign off  LOS: 4 days    Connie Williamson 01/10/2024

## 2024-01-10 NOTE — Plan of Care (Signed)

## 2024-01-10 NOTE — Progress Notes (Signed)
 Date and time results received: 01/10/24 6:14 AM    Test: Hgb Critical Value: 6.6  Name of Provider Notified: Chavez, abigail, NP  Orders Received? Or Actions Taken?:

## 2024-01-10 NOTE — Progress Notes (Addendum)
 [facility]  PROGRESS NOTE  Connie Williamson FMW:[id number] DOB: November 02, 1995 DOA: 01/05/2024 PCP: Paseda, Folashade R, FNP   Brief HPI:    28 y.o. female with medical history significant of asthma, hypertension, DM Connie Williamson, class I obesity, type 1 diabetes, anxiety and depression, iron deficiency anemia, Crohn's disease complicated by adhesion requiring lysis and small bowel resection who presented to the emergency department complaints of abdominal pain, nausea and multiple episodes of emesis.  She has been constipated and nauseous for several days, but started vomiting yesterday.  Last month she was admitted for Crohn's disease.  Upon admission found to have pelvic/ovarian abscess, GI, general surgery, IR consulted.  Plans for aspiration by IR today    Assessment/Plan:   Diabetic ketoacidosis -Resolved Uncontrolled insulin -dependent DM2 with hyperglycemia; -She was transitioned to subcutaneous insulin  but was back in DKA -Endo tool with IV insulin  was restarted -DKA has resolved.  IV insulin  has been discontinued  -Started on Lantus  30 units subcu daily -Also on NovoLog  3 units 3 times daily meal coverage; CBG was low this morning, will discontinue meal coverage     Pelvic abscess in female  On empiric Rocephin  and Flagyl .  Seen on the CT and ultrasound pelvic.  Recommending MRI pelvic or repeating ultrasound in 6 weeks -Status post drain placement per IR -Currently on ceftriaxone  and Flagyl  -Abscess culture i grew mixed flora   Crohn's disease (HCC) GI team is following.  Holding off on steroids, continue antibiotics   Essential hypertension, benign IV as needed     Asthma DuoNebs as needed    Sleep apnea Given intra-abdominal issues. Hold CPAP for now.     Thrombocytosis Secondary to anemia and acute illness.     Iron deficiency anemia Hemoglobin dropped to 6.6 this morning - 1 unit PRBC ordered - Repeat hemoglobin is 8.9  Hypokalemia -  Replete    Medications     Chlorhexidine  Gluconate Cloth  6 each Topical Daily   FLUoxetine   20 mg Oral Daily   insulin  aspart  0-9 Units Subcutaneous TID WC   insulin  glargine-yfgn  30 Units Subcutaneous Daily   sodium chloride  flush  5 mL Intracatheter Q8H     Data Reviewed:   CBG:  Recent Labs  Lab 01/09/24 1706 01/09/24 2138 01/10/24 0733 01/10/24 1151 01/10/24 1606  GLUCAP 142* 92 79 107* 131*    SpO2: 100 % O2 Flow Rate (L/min): 0 L/min    Vitals:   01/10/24 0522 01/10/24 1014 01/10/24 1040 01/10/24 1316  BP: 114/67  (!) 136/91 124/79  Pulse: 100 100 98 96  Resp: 19  18   Temp: 98 F (36.7 C) 98.7 F (37.1 C) 98.5 F (36.9 C) 98.6 F (37 C)  TempSrc: Oral Oral Oral Oral  SpO2: 97% 98% 98% 100%  Weight:      Height:          Data Reviewed:  Basic Metabolic Panel: Recent Labs  Lab 01/06/24 0532 01/06/24 1006 01/06/24 1503 01/07/24 0601 01/07/24 1117 01/07/24 2326 01/08/24 0522 01/08/24 1215 01/09/24 0552 01/10/24 0534  NA 135 135   < > 134*   < > 134* 134* 134* 138 135  K 3.6 3.4*   < > 3.8   < > 3.3* 3.4* 3.8 3.4* 3.6  CL 106 104   < > 101   < > 103 103 103 104 99  CO2 16* 21*   < > 17*   < > 23 24 24 25 25   GLUCOSE  159* 188*   < > 297*   < > 105* 158* 158* 93 79  BUN 7 6   < > 6   < > <5* <5* <5* <5* <5*  CREATININE 0.72 0.47   < > 0.76   < > 0.70 0.56 0.65 0.53 0.46  CALCIUM  9.0 8.9   < > 8.6*   < > 7.8* 7.9* 8.1* 7.9* 8.0*  MG 1.5*  --   --  1.6*  --   --  1.9  --   --   --   PHOS  --  2.9  --   --   --   --  2.5  --   --   --    < > = values in this interval not displayed.    CBC: Recent Labs  Lab 01/06/24 0532 01/07/24 0601 01/08/24 0522 01/09/24 0552 01/10/24 0534 01/10/24 1550  WBC 27.1* 21.0* 14.0* 11.5* 10.0  --   NEUTROABS 23.8*  --   --   --   --   --   HGB 10.0* 9.0* 7.9* 7.0* 6.6* 8.9*  HCT 35.1* 31.2* 27.3* 25.4* 23.7* 30.4*  MCV 79.2* 78.0* 78.2* 80.6 80.6  --   PLT 707* 603* 477* 455* 420*  --      LFT Recent Labs  Lab 01/05/24 2236 01/06/24 0532  AST <5* 7*  ALT 7 8  ALKPHOS 149* 122  BILITOT 1.4* 1.0  PROT 9.1* 7.3  ALBUMIN 3.3* 2.8*     Antibiotics: Anti-infectives (From admission, onward)    Start     Dose/Rate Route Frequency Ordered Stop   01/07/24 0000  cefTRIAXone  (ROCEPHIN ) 2 g in sodium chloride  0.9 % 100 mL IVPB        2 g 200 mL/hr over 30 Minutes Intravenous Every 24 hours 01/06/24 0328     01/06/24 1300  metroNIDAZOLE  (FLAGYL ) IVPB 500 mg        500 mg 100 mL/hr over 60 Minutes Intravenous Every 12 hours 01/06/24 0328     01/06/24 0015  cefTRIAXone  (ROCEPHIN ) 2 g in sodium chloride  0.9 % 100 mL IVPB        2 g 200 mL/hr over 30 Minutes Intravenous Once 01/06/24 0006 01/06/24 0148   01/06/24 0015  metroNIDAZOLE  (FLAGYL ) IVPB 500 mg        500 mg 100 mL/hr over 60 Minutes Intravenous  Once 01/06/24 0006 01/06/24 0301        DVT prophylaxis: SCDs  Code Status: Full code  Family Communication: Discussed with patient's mother at bedside   CONSULTS General Surgery, IR   Subjective    Denies any complaints.  Objective    Physical Examination:   General-appears in no acute distress Heart-S1-S2, regular, no murmur auscultated Lungs-clear to auscultation bilaterally, no wheezing or crackles auscultated Abdomen-soft, mild tenderness in right lower quadrant Extremities-no edema in the lower extremities Neuro-alert, oriented x3, no focal deficit noted  Status is: Inpatient:             Connie Williamson   [facility] If 7PM-7AM, please contact night-coverage at [email or web address], Office  [phone]   01/10/2024, 5:06 PM  LOS: 4 days

## 2024-01-10 NOTE — Plan of Care (Addendum)
 Pt and pt family received first education on JP drain care including emptying drainage, recording drainage, charging drain, flushing the drain, and dressing changes.  Pt demonstrated understanding, and pt and family verbalized understanding.  RN encouraged pt for future JP drain care, for pt to demonstrate learning to oncoming healthcare providers for reinforcement.

## 2024-01-11 ENCOUNTER — Other Ambulatory Visit: Payer: Self-pay | Admitting: Student

## 2024-01-11 ENCOUNTER — Other Ambulatory Visit (HOSPITAL_COMMUNITY): Payer: Self-pay

## 2024-01-11 ENCOUNTER — Encounter (HOSPITAL_COMMUNITY): Payer: Self-pay | Admitting: Internal Medicine

## 2024-01-11 DIAGNOSIS — I10 Essential (primary) hypertension: Secondary | ICD-10-CM | POA: Diagnosis not present

## 2024-01-11 DIAGNOSIS — R1030 Lower abdominal pain, unspecified: Secondary | ICD-10-CM | POA: Diagnosis not present

## 2024-01-11 DIAGNOSIS — K5000 Crohn's disease of small intestine without complications: Secondary | ICD-10-CM | POA: Diagnosis not present

## 2024-01-11 DIAGNOSIS — E1110 Type 2 diabetes mellitus with ketoacidosis without coma: Secondary | ICD-10-CM | POA: Diagnosis not present

## 2024-01-11 LAB — CULTURE, BLOOD (SINGLE): Culture: NO GROWTH

## 2024-01-11 LAB — CULTURE, BLOOD (ROUTINE X 2)
Culture: NO GROWTH
Culture: NO GROWTH

## 2024-01-11 LAB — CBC
HCT: 28.1 % — ABNORMAL LOW (ref 36.0–46.0)
Hemoglobin: 8.2 g/dL — ABNORMAL LOW (ref 12.0–15.0)
MCH: 23.7 pg — ABNORMAL LOW (ref 26.0–34.0)
MCHC: 29.2 g/dL — ABNORMAL LOW (ref 30.0–36.0)
MCV: 81.2 fL (ref 80.0–100.0)
Platelets: 433 K/uL — ABNORMAL HIGH (ref 150–400)
RBC: 3.46 MIL/uL — ABNORMAL LOW (ref 3.87–5.11)
RDW: 19.3 % — ABNORMAL HIGH (ref 11.5–15.5)
WBC: 10.1 K/uL (ref 4.0–10.5)
nRBC: 0.2 % (ref 0.0–0.2)

## 2024-01-11 LAB — BASIC METABOLIC PANEL WITH GFR
Anion gap: 10 (ref 5–15)
BUN: <5 mg/dL — ABNORMAL LOW (ref 6–20)
CO2: 27 mmol/L (ref 22–32)
Calcium: 8 mg/dL — ABNORMAL LOW (ref 8.9–10.3)
Chloride: 98 mmol/L (ref 98–111)
Creatinine, Ser: 0.57 mg/dL (ref 0.44–1.00)
GFR, Estimated: >60 mL/min (ref >60)
Glucose, Bld: 148 mg/dL — ABNORMAL HIGH (ref 70–99)
Potassium: 3.6 mmol/L (ref 3.5–5.1)
Sodium: 135 mmol/L (ref 135–145)

## 2024-01-11 LAB — GLUCOSE, CAPILLARY
Glucose-Capillary: 114 mg/dL — ABNORMAL HIGH (ref 70–99)
Glucose-Capillary: 150 mg/dL — ABNORMAL HIGH (ref 70–99)

## 2024-01-11 MED ORDER — FLUCONAZOLE 200 MG PO TABS: 200.0000 mg | ORAL_TABLET | Freq: Every day | ORAL | 0 refills | Status: AC

## 2024-01-11 MED ORDER — FERROUS GLUCONATE 324 (38 FE) MG PO TABS: 324.0000 mg | ORAL_TABLET | Freq: Every day | ORAL | 1 refills | Status: AC

## 2024-01-11 MED ORDER — SODIUM CHLORIDE FLUSH 0.9 % IV SOLN
5.0000 mL | Freq: Every day | INTRAVENOUS | 0 refills | Status: AC
  Filled 2024-01-11: qty 200, 20d supply, fill #0

## 2024-01-11 MED ORDER — AMOXICILLIN-POT CLAVULANATE 875-125 MG PO TABS
1.0000 | ORAL_TABLET | Freq: Two times a day (BID) | ORAL | 0 refills | Status: AC
Start: 2024-01-11 — End: 2024-01-18

## 2024-01-11 MED ORDER — SODIUM CHLORIDE 0.9 % IJ SOLN
10.0000 mL | Freq: Every day | INTRAMUSCULAR | 0 refills | Status: DC
Start: 2024-01-11 — End: 2024-01-11

## 2024-01-11 NOTE — Discharge Summary (Signed)
 Physician Discharge Summary   Patient: Connie Williamson MRN: [id number] DOB: May 11, 1996  Admit date:     01/05/2024  Discharge date: 01/11/24  Discharge Physician: Sabas GORMAN Brod   PCP: Paseda, Folashade R, FNP   Recommendations at discharge:   Follow-up general surgery as outpatient  Discharge Diagnoses: Principal Problem:   DKA (diabetic ketoacidosis) (HCC) Active Problems:   Uncontrolled type 2 diabetes mellitus with hyperglycemia, with long-term current use of insulin  (HCC)   Essential hypertension, benign   Asthma   Sleep apnea   Obesity (BMI 30-39.9)   Crohn's disease (HCC)   Iron deficiency anemia   Anxiety and depression   Moderate protein malnutrition (HCC)   Hypomagnesemia   Thrombocytosis   Pelvic abscess in female  Resolved Problems:   * No resolved hospital problems. *  Hospital Course:  28 y.o. female with medical history significant of asthma, hypertension, DM [name], class I obesity, type 1 diabetes, anxiety and depression, iron deficiency anemia, Crohn's disease complicated by adhesion requiring lysis and small bowel resection who presented to the emergency department complaints of abdominal pain, nausea and multiple episodes of emesis.  She has been constipated and nauseous for several days, but started vomiting yesterday.  Last month she was admitted for Crohn's disease.  Upon admission found to have pelvic/ovarian abscess, GI, general surgery, IR consulted.  Plans for aspiration by IR today   Assessment and plan  Diabetic ketoacidosis -Resolved Uncontrolled insulin -dependent DM2 with hyperglycemia; -She was transitioned to subcutaneous insulin  but was back in DKA -Endo tool with IV insulin  was restarted -DKA has resolved.  IV insulin  has been discontinued  -Started on Lantus  30 units subcu daily     Pelvic abscess in female  On empiric Rocephin  and Flagyl .  Seen on the CT and ultrasound pelvic.  Recommending MRI pelvic or repeating ultrasound in 6  weeks -Status post drain placement per IR -Currently on ceftriaxone  and Flagyl  -Abscess culture grew mixed flora; also grew Candida albicans -Will discharge on Augmentin  and Diflucan  for 7 days -Patient will flush the drain daily, follow-up with IR   Crohn's disease (HCC) Continue prednisone     Asthma DuoNebs as needed     Thrombocytosis Secondary to anemia and acute illness.     Iron deficiency anemia Hemoglobin dropped to 6.6 this morning - 1 unit PRBC ordered - Repeat hemoglobin is 8.2 - Will discharge on iron supplementation with ferrous gluconate    Hypokalemia - Replete        Consultants: General Surgery, interventional neurology, gastroenterology Procedures performed: Drainage of the abscess per IR with drain placement Disposition: Home Diet recommendation:  Discharge Diet Orders (From admission, onward)     Start     Ordered   01/11/24 0000  Diet - low sodium heart healthy        01/11/24 1310           Regular diet DISCHARGE MEDICATION: Allergies as of 01/11/2024   No Known Allergies      Medication List     STOP taking these medications    Na Sulfate-K Sulfate-Mg Sulfate concentrate 17.5-3.13-1.6 GM/177ML Soln Commonly known as: SUPREP   oxyCODONE  5 MG immediate release tablet Commonly known as: Roxicodone        TAKE these medications    amoxicillin -clavulanate 875-125 MG tablet Commonly known as: AUGMENTIN  Take 1 tablet by mouth 2 (two) times daily for 7 days.   Dexcom G7 Sensor Misc Please use as directed.   fluconazole  200 MG tablet Commonly known  as: Diflucan  Take 1 tablet (200 mg total) by mouth daily for 7 days. What changed:  when to take this reasons to take this   FLUoxetine  20 MG tablet Commonly known as: PROZAC  Take 1 tablet (20 mg total) by mouth daily.   metFORMIN  500 MG 24 hr tablet Commonly known as: GLUCOPHAGE -XR Take 1 tablet (500mg ) by mouth daily with breakfast, after 1 week increase to 500 mg twice  daily with food   ondansetron  4 MG disintegrating tablet Commonly known as: ZOFRAN -ODT Take 1 tablet (4 mg total) by mouth every 8 (eight) hours as needed for nausea or vomiting.   polyethylene glycol powder 17 GM/SCOOP powder Commonly known as: MiraLax  Take 17 g by mouth 2 (two) times daily. Decrease to daily if you develop diarrhea. What changed:  when to take this reasons to take this additional instructions   predniSONE  10 MG tablet Commonly known as: DELTASONE  Take 40mg  (4 tablets) a day for 1 week, 30mg  a day (3 tablets) for the second week, 20mg  (2 tablets) a day for the third week, 10mg  (1 tablet) a day for the fourth week. What changed:  how much to take how to take this when to take this additional instructions   rosuvastatin  5 MG tablet Commonly known as: Crestor  Take 1 tablet (5 mg total) by mouth daily.   Semaglutide (0.25 or 0.5MG /DOS) 2 MG/3ML Sopn Inject 0.5 mg into the skin once a week.   sodium chloride  flush 0.9 % Soln injection 5-10 mLs by Intracatheter route daily for 20 days. Flush the drain with 5 mL normal saline once a day. Measure and document output daily.   Tresiba  FlexTouch 100 UNIT/ML FlexTouch Pen Generic drug: insulin  degludec Inject 44 Units into the skin at bedtime. What changed:  how much to take when to take this               Discharge Care Instructions  (From admission, onward)           Start     Ordered   01/11/24 0000  Discharge wound care:       Comments: Flush JP drain  once daily   01/11/24 1310            Follow-up Information     Debby Hila, MD Follow up in 1 month(s).   Specialties: General Surgery, Colon and Rectal Surgery Contact information: [address] [place] [address] [phone]         Jennefer Ester PARAS, MD Follow up.   Specialties: Interventional Radiology, Diagnostic Radiology, Radiology Why: Drain follow up in 10-14 days after discharge. You will get a call  from our scheduler to set up the appointment. Contact information: [address] [place] [address] [phone]                Discharge Exam: Filed Weights   01/07/24 0500 01/08/24 0400 01/10/24 0500  Weight: 86.9 kg 89.7 kg 90.4 kg   General-appears in no acute distress Heart-S1-S2, regular, no murmur auscultated Lungs-clear to auscultation bilaterally, no wheezing or crackles auscultated Abdomen-soft, nontender, no organomegaly Extremities-no edema in the lower extremities Neuro-alert, oriented x3, no focal deficit noted  Condition at discharge: good  The results of significant diagnostics from this hospitalization (including imaging, microbiology, ancillary and laboratory) are listed below for reference.   Imaging Studies: CT GUIDED PERITONEAL/RETROPERITONEAL FLUID DRAIN BY PERC CATH Result Date: 01/07/2024 INDICATION: 28 year old female with history of ileocolonic anastomosis complicated by postoperative  abscess formation. EXAM: CT PERC DRAIN PERITONEAL ABCESS COMPARISON:  01/06/2024 MEDICATIONS: The patient is currently admitted to the hospital and receiving intravenous antibiotics. The antibiotics were administered within an appropriate time frame prior to the initiation of the procedure. ANESTHESIA/SEDATION: Moderate (conscious) sedation was employed during this procedure. A total of Versed  2 mg and Fentanyl  50 mcg was administered intravenously. Moderate Sedation Time: 14 minutes. The patient's level of consciousness and vital signs were monitored continuously by radiology nursing throughout the procedure under my direct supervision. CONTRAST:  100 mL Omnipaque  300, intravenous COMPLICATIONS: None immediate. PROCEDURE: RADIATION DOSE REDUCTION: This exam was performed according to the departmental dose-optimization program which includes automated exposure control, adjustment of the mA and/or kV according to patient size and/or use of iterative reconstruction  technique. Informed written consent was obtained from the patient after a discussion of the risks, benefits and alternatives to treatment. The patient was placed supine on the CT gantry and a pre procedural CT was performed re-demonstrating the known abscess/fluid collection within the lower abdomen. The procedure was planned. A timeout was performed prior to the initiation of the procedure. The right lower quadrant was prepped and draped in the usual sterile fashion. The overlying soft tissues were anesthetized with 1% lidocaine  with epinephrine . Appropriate trajectory was planned with the use of a 22 gauge spinal needle. An 18 gauge trocar needle was advanced into the abscess/fluid collection and a short Amplatz super stiff wire was coiled within the collection. Appropriate positioning was confirmed with a limited CT scan. The tract was serially dilated allowing placement of a 10 [place] all-purpose drainage catheter. Appropriate positioning was confirmed with a limited postprocedural CT scan. Approximately 90 ml of purulent fluid was aspirated. The tube was connected to a bulb suction and sutured in place. A dressing was placed. The patient tolerated the procedure well without immediate post procedural complication. IMPRESSION: Successful CT guided placement of a 10 French all purpose drain catheter into the lower abdominal abscess with aspiration of 90 mL of purulent fluid. Samples were sent to the laboratory as requested by the ordering clinical team. Ester Sides, MD [facility] Radiology Electronically Signed   By: Ester Sides M.D.   On: 01/07/2024 17:20   CT ABDOMEN PELVIS W CONTRAST Result Date: 01/06/2024 CLINICAL DATA:  Bowel obstruction suspected.  Sepsis. EXAM: CT ABDOMEN AND PELVIS WITH CONTRAST TECHNIQUE: Multidetector CT imaging of the abdomen and pelvis was performed using the standard protocol following bolus administration of intravenous  contrast. RADIATION DOSE REDUCTION: This exam was performed according to the departmental dose-optimization program which includes automated exposure control, adjustment of the mA and/or kV according to patient size and/or use of iterative reconstruction technique. CONTRAST:  OMNIPAQUE  IOHEXOL  300 MG/ML  SOLN COMPARISON:  CTs with IV contrast 12/12/2023, 11/15/2023 and CT pelvis with contrast 03/04/2022. Pelvic ultrasound 12/12/2023. FINDINGS: Lower chest: No abnormality. Hepatobiliary: 19 cm in length liver, slightly steatotic without mass. Unremarkable gallbladder and bile ducts. Pancreas: No abnormality. Spleen: No abnormality. Adrenals/Urinary Tract: No abnormality. Stomach/Bowel: Unremarkable contracted stomach. Normal caliber upper to mid abdominal small bowel. Unremarkable small bowel anastomosis anterior left lower abdominopelvic quadrant. Remote ileocecectomy with primary surgical anastomosis, again noted inflamed appearance in the distal ileum leading up to the anastomosis. There is interval development of an irregular rim enhancing hypodense collection in the upper pelvis just above the uterus and bladder in the midline measuring 5.5 x 5.2 x 6.8 cm partially abutting the inflamed neoterminal ileum. Its Hounsfield  density is above that of simple fluid ranging 41-47 which could indicate viscous abscess fluid, phlegmon or hematoma. There is no air in the collection. It is also possible this could communicate with 1 or more of the surrounding small bowel segments which it abuts. There are no dilated segments to suspect mechanical obstruction. The colon wall is normal thickness with mild fecal stasis. Vascular/Lymphatic: No significant vascular findings. Borderline to slightly prominent mesenteric lymph nodes are unchanged. There is a right common iliac chain node measuring 1 cm in short axis, unchanged. No new adenopathy. Reproductive: The the uterus is intact. Left ovary unremarkable. Right ovary again  noted enlarged and enhancing measuring 4.8 x 3.9 cm which could indicate secondary inflammation from adjacent inflamed bowel or infection including possible tubo-ovarian abscess. It is only slightly larger than 25 days ago. Other: Mild pelvic cul-de-sac fluid is likely reactive. No other free fluid is seen. No free air or abdominal wall hernia. Musculoskeletal: No acute or significant osseous findings. IMPRESSION: 1. 5.5 x 5.2 x 6.8 cm irregular rim enhancing hypodense collection in the upper pelvis just above the uterus and bladder in the midline partially abutting the inflamed neoterminal ileum. Its Hounsfield density is above that of simple fluid which could indicate viscous abscess fluid, phlegmon or hematoma. It is also possible this could communicate with 1 or more of the surrounding small bowel segments which it abuts. 2. Right ovary again noted enlarged and enhancing measuring 4.8 x 3.9 cm which could indicate secondary inflammation from adjacent inflamed bowel or infection including possible tubo-ovarian abscess. It is only slightly larger than 25 days ago. 3. Mild pelvic cul-de-sac fluid is likely reactive. 4. Constipation. 5. Stable borderline to slightly prominent mesenteric and right common iliac chain lymph nodes. 6. Slightly enlarged and steatotic liver. Electronically Signed   By: Francis Quam M.D.   On: 01/06/2024 02:19   US  PELVIC COMPLETE W TRANSVAGINAL AND TORSION R/O Result Date: 12/12/2023 CLINICAL DATA:  Four day history of right-sided pelvic pain EXAM: TRANSABDOMINAL AND TRANSVAGINAL ULTRASOUND OF PELVIS DOPPLER ULTRASOUND OF OVARIES TECHNIQUE: Both transabdominal and transvaginal ultrasound examinations of the pelvis were performed. Transabdominal technique was performed for global imaging of the pelvis including uterus, ovaries, adnexal regions, and pelvic cul-de-sac. It was necessary to proceed with endovaginal exam following the transabdominal exam to visualize the pelvic structures.  Color and duplex Doppler ultrasound was utilized to evaluate blood flow to the ovaries. COMPARISON:  Same day CT abdomen and pelvis FINDINGS: Uterus Measurements: 8.6 cm in sagittal dimension. No fibroids or other mass visualized. Endometrium Thickness: 5 mm.  No focal abnormality visualized. Right ovary Measurements: 6.4 x 5.0 x 4.0 cm = volume: 67.0 mL. The right ovary is nearly entirely replaced by a complex cystic structure with thick internal septations. Left ovary Measurements: 3.8 x 3.3 x 1.9 cm = volume: 12.5 mL. Normal appearance. No adnexal mass. Pulsed Doppler evaluation of both ovaries demonstrates normal low-resistance arterial and venous waveforms. Other findings No abnormal free fluid. IMPRESSION: 1. No evidence of ovarian torsion. 2. The right ovary is nearly entirely replaced by a complex cystic structure with thick internal septations, which may represent hemorrhagic cyst, tubo-ovarian abscess, or epithelial ovarian neoplasm. Recommend follow-up pelvic ultrasound examination in 6 weeks to ensure resolution. Alternatively, pelvic MRI. Electronically Signed   By: Limin  Xu M.D.   On: 12/12/2023 15:49   CT ABDOMEN PELVIS W CONTRAST Result Date: 12/12/2023 CLINICAL DATA:  RLQ abdominal pain - eval for appendicitis - recent treatment  for ileitis. EXAM: CT ABDOMEN AND PELVIS WITH CONTRAST TECHNIQUE: Multidetector CT imaging of the abdomen and pelvis was performed using the standard protocol following bolus administration of intravenous contrast. RADIATION DOSE REDUCTION: This exam was performed according to the departmental dose-optimization program which includes automated exposure control, adjustment of the mA and/or kV according to patient size and/or use of iterative reconstruction technique. CONTRAST:  OMNIPAQUE  IOHEXOL  300 MG/ML  SOLN COMPARISON:  CT scan abdomen and pelvis from 11/15/2023. FINDINGS: Lower chest: The lung bases are clear. No pleural effusion. The heart is normal in size. No  pericardial effusion. Hepatobiliary: The liver is normal in size. Non-cirrhotic configuration. No suspicious mass. No intrahepatic or extrahepatic bile duct dilation. No calcified gallstones. Normal gallbladder wall thickness. No pericholecystic inflammatory changes. Pancreas: Unremarkable. No pancreatic ductal dilatation or surrounding inflammatory changes. Spleen: Within normal limits. No focal lesion. Adrenals/Urinary Tract: Adrenal glands are unremarkable. No suspicious renal mass. No hydronephrosis. No renal or ureteric calculi. Urinary bladder is under distended, precluding optimal assessment. However, no large mass or stones identified. No perivesical fat stranding. Stomach/Bowel: No disproportionate dilation of the small or large bowel loops. Patient is status post partial right hemicolectomy with ileocolonic anastomosis in the right lower quadrant. Appendix is surgically absent. Redemonstration of an approximately 10-12 cm long segment of neoterminal ileum exhibiting mild-to-moderate circumferential wall thickening, mucosal hyperattenuation and surrounding fat stranding, favoring active inflammatory bowel disease of neo terminal ileum, highly concerning for Crohn's disease. There is surrounding fat stranding and trace amount of fluid. No proximal bowel dilation. No other affected bowel loop segments seen. Vascular/Lymphatic: There is trace amount of fluid surrounding the neo terminal ileum, likely reactive, as discussed above. No walled-off abscess or loculated collection. No pneumoperitoneum. There are several prominent right lower quadrant mesenteric lymph nodes, favored benign/reactive. No aneurysmal dilation of the major abdominal arteries. Reproductive: Reproductive organs are not well evaluated on the CT scan exam. However, having said that a normal-sized anteverted uterus is noted. There is in slightly hyperattenuating walled right ovary measuring 3.6 x 4.1 cm, which appears grossly similar to the  prior study and underlying secondary inflammation of the ovary is not excluded considering the proximity to the inflamed neo terminal ileum. No left adnexal mass seen. Other: Midline surgical scar noted. The soft tissues and abdominal wall are otherwise unremarkable. Musculoskeletal: No suspicious osseous lesions. IMPRESSION: 1. Redemonstration of approximately 10-12 cm long segment of neoterminal ileum exhibiting mild-to-moderate circumferential wall thickening, mucosal hyperattenuation and surrounding fat stranding, favoring active inflammatory bowel disease, highly concerning for Crohn's disease. There is surrounding trace amount of fluid. No walled-off abscess or loculated collection. No pneumoperitoneum. 2. There is slightly hyperattenuating walled right ovary measuring 3.6 x 4.1 cm, which appears grossly similar to the prior study and underlying secondary inflammation of the ovary is not excluded considering the proximity to the inflamed neo terminal ileum. 3. Multiple other nonacute observations, as described above. Electronically Signed   By: Ree Molt M.D.   On: 12/12/2023 13:40    Microbiology: Results for orders placed or performed during the hospital encounter of 01/05/24  Blood culture (routine x 2)     Status: None   Collection Time: 01/06/24 12:28 AM   Specimen: BLOOD  Result Value Ref Range Status   Specimen Description   Final    BLOOD BLOOD LEFT ARM Performed at [facility], [address], [place], [place] [address]    Special Requests   Final    BOTTLES DRAWN  AEROBIC AND ANAEROBIC Blood Culture results may not be optimal due to an inadequate volume of blood received in culture bottles Performed at [facility], [address] [place], [place] [address]    Culture   Final    NO GROWTH 5 DAYS Performed at [facility], [address], [place], [place] [address]    Report Status 01/11/2024 FINAL  Final  Blood culture (routine x  2)     Status: None   Collection Time: 01/06/24 12:49 AM   Specimen: BLOOD  Result Value Ref Range Status   Specimen Description   Final    BLOOD CENTRAL LINE Performed at [facility], [address], [place], [place] [address]    Special Requests   Final    BOTTLES DRAWN AEROBIC AND ANAEROBIC Blood Culture results may not be optimal due to an inadequate volume of blood received in culture bottles Performed at [facility], [address], [place], [place] [address]    Culture   Final    NO GROWTH 5 DAYS Performed at [facility], [address], [place], [place] [address]    Report Status 01/11/2024 FINAL  Final  MRSA Next Gen by PCR, Nasal     Status: None   Collection Time: 01/06/24  3:39 AM   Specimen: Nasal Mucosa; Nasal Swab  Result Value Ref Range Status   MRSA by PCR Next Gen NOT DETECTED NOT DETECTED Final    Comment: (NOTE) The GeneXpert MRSA Assay (FDA approved for NASAL specimens only), is one component of a comprehensive MRSA colonization surveillance program. It is not intended to diagnose MRSA infection nor to guide or monitor treatment for MRSA infections. Test performance is not FDA approved in patients less than 61 years old. Performed at [facility], [address] [place], [place] [address]   Culture, blood (single)     Status: None   Collection Time: 01/06/24  7:08 AM   Specimen: BLOOD  Result Value Ref Range Status   Specimen Description   Final    BLOOD BLOOD RIGHT HAND Performed at [facility], [address], [place], [place] [address]    Special Requests   Final    BOTTLES DRAWN AEROBIC AND ANAEROBIC Blood Culture results may not be optimal due to an inadequate volume of blood received in culture bottles Performed at [facility], [address] [place], [place] [address]    Culture   Final    NO GROWTH 5 DAYS Performed at [facility], [address], [place], [place] [address]    Report Status 01/11/2024 FINAL  Final  Aerobic/Anaerobic Culture w Gram Stain (surgical/deep wound)     Status: None   Collection Time: 01/07/24  4:54 PM   Specimen: Abscess  Result Value Ref Range Status   Specimen Description   Final    ABSCESS Performed at [facility], [address], [place], [place] [address]    Special Requests   Final    ABDOMINAL Performed at [facility], [address] [place], [place] [address]    Gram Stain   Final    MODERATE WBC PRESENT,BOTH PMN AND MONONUCLEAR MODERATE GRAM POSITIVE RODS FEW GRAM POSITIVE COCCI Performed at [facility], [address] [place], [place] [address]    Culture   Final    FEW CANDIDA ALBICANS MIXED ANAEROBIC FLORA PRESENT.  CALL LAB IF FURTHER IID REQUIRED.    Report Status 01/10/2024 FINAL  Final    Labs: CBC: Recent Labs  Lab 01/06/24 0532 01/07/24 0601 01/08/24 0522 01/09/24 0552 01/10/24 0534 01/10/24 1550 01/11/24 0541  WBC 27.1* 21.0* 14.0* 11.5* 10.0  --  10.1  NEUTROABS 23.8*  --   --   --   --   --   --   HGB 10.0* 9.0* 7.9* 7.0* 6.6* 8.9* 8.2*  HCT 35.1* 31.2* 27.3* 25.4* 23.7* 30.4* 28.1*  MCV 79.2* 78.0* 78.2* 80.6 80.6  --  81.2  PLT 707* 603* 477* 455* 420*  --  433*   Basic Metabolic Panel: Recent Labs  Lab 01/06/24 0532 01/06/24 1006 01/06/24 1503 01/07/24 0601 01/07/24 1117 01/08/24 0522 01/08/24 1215 01/09/24 0552 01/10/24 0534 01/11/24 0541  NA 135 135   < > 134*   < > 134* 134* 138 135 135  K 3.6 3.4*   < > 3.8   < > 3.4* 3.8 3.4* 3.6 3.6  CL 106 104   < > 101   < > 103 103 104 99 98  CO2 16* 21*   < > 17*   < > 24 24 25 25 27   GLUCOSE 159* 188*   < > 297*   < > 158* 158* 93 79 148*  BUN 7 6   < > 6   < > <5* <5* <5* <5* <5*  CREATININE 0.72 0.47   < > 0.76   < > 0.56 0.65 0.53 0.46 0.57  CALCIUM  9.0 8.9   < > 8.6*   < > 7.9* 8.1* 7.9* 8.0* 8.0*  MG 1.5*  --   --  1.6*  --  1.9   --   --   --   --   PHOS  --  2.9  --   --   --  2.5  --   --   --   --    < > = values in this interval not displayed.   Liver Function Tests: Recent Labs  Lab 01/05/24 2236 01/06/24 0532  AST <5* 7*  ALT 7 8  ALKPHOS 149* 122  BILITOT 1.4* 1.0  PROT 9.1* 7.3  ALBUMIN 3.3* 2.8*   CBG: Recent Labs  Lab 01/10/24 1151 01/10/24 1606 01/10/24 2103 01/11/24 0735 01/11/24 1125  GLUCAP 107* 131* 158* 114* 150*    Discharge time spent: greater than 30 minutes.  Signed: Sabas GORMAN Brod, MD [facility] 01/11/2024

## 2024-01-11 NOTE — Plan of Care (Signed)

## 2024-01-13 ENCOUNTER — Other Ambulatory Visit (HOSPITAL_COMMUNITY): Payer: Self-pay

## 2024-01-13 ENCOUNTER — Other Ambulatory Visit: Payer: Self-pay | Admitting: Nurse Practitioner

## 2024-01-13 ENCOUNTER — Telehealth: Payer: Self-pay

## 2024-01-13 LAB — TYPE AND SCREEN
ABO/RH(D): O POS
Antibody Screen: NEGATIVE
Unit division: 0

## 2024-01-13 LAB — BPAM RBC
Blood Product Expiration Date: 202508072359
ISSUE DATE / TIME: 202507041007
Unit Type and Rh: 5100

## 2024-01-13 MED ORDER — OXYCODONE-ACETAMINOPHEN 5-325 MG PO TABS: 1.0000 | ORAL_TABLET | Freq: Three times a day (TID) | ORAL | 0 refills | Status: AC | PRN

## 2024-01-13 NOTE — Transitions of Care (Post Inpatient/ED Visit) (Signed)
 01/13/2024  Name: Connie Williamson MRN: [id number] DOB: 10-25-1995  Today's TOC FU Call Status: Today's TOC FU Call Status:: Successful TOC FU Call Completed TOC FU Call Complete Date: 01/13/24 Patient's Name and Date of Birth confirmed.  Transition Care Management Follow-up Telephone Call Date of Discharge: 01/11/24 Discharge Facility: [facility]) Type of Discharge: Inpatient Admission Primary Inpatient Discharge Diagnosis:: diabetic ketoacidosis/ pelvic abscess How have you been since you were released from the hospital?: Better Any questions or concerns?: Yes Patient Questions/Concerns:: patient states she has not been able to flush her drain because there is a piece missing. Patient Questions/Concerns Addressed: Other: (Reviewed discharge instructions. Confirmed patient aware to contact IR office today to report inability to flush drain.  Confirmed patient has contact phone number and provider name.  Advised to follow up at hospital for ongoing issues with drain.)  Items Reviewed: Did you receive and understand the discharge instructions provided?: Yes Medications obtained,verified, and reconciled?: Yes (Medications Reviewed) Any new allergies since your discharge?: No Dietary orders reviewed?: Yes Type of Diet Ordered:: low sodium, heart healthy Do you have support at home?: Yes People in Home [RPT]: parent(s) Name of Support/Comfort Primary Source: Connie Williamson, mother  Medications Reviewed Today: Medications Reviewed Today     Reviewed by Connie Siddique E, RN (Registered Nurse) on 01/13/24 at 1356  Med List Status: <None>   Medication Order Taking? Sig Documenting Provider Last Dose Status Informant  amoxicillin -clavulanate (AUGMENTIN ) 875-125 MG tablet [id number] Yes Take 1 tablet by mouth 2 (two) times daily for 7 days. Connie Williamson  Active   Continuous Glucose Sensor (DEXCOM G7 SENSOR) [place] [id number]  Please use as directed. Connie Williamson  Active  Self, Pharmacy Records  ferrous gluconate  [place]) 324 MG tablet [id number] Yes Take 1 tablet (324 mg total) by mouth daily with breakfast. Connie Williamson  Active   fluconazole  (DIFLUCAN ) 200 MG tablet [id number] Yes Take 1 tablet (200 mg total) by mouth daily for 7 days. Connie Williamson  Active   FLUoxetine  (PROZAC ) 20 MG tablet [id number] Yes Take 1 tablet (20 mg total) by mouth daily. Connie Williamson  Active Self, Pharmacy Records  insulin  degludec (TRESIBA  FLEXTOUCH) 100 UNIT/ML FlexTouch Pen [id number] Yes Inject 44 Units into the skin at bedtime. Connie Williamson  Active Self, Pharmacy Records  metFORMIN  (GLUCOPHAGE -XR) 500 MG 24 hr tablet [id number] Yes Take 1 tablet (500mg ) by mouth daily with breakfast, after 1 week increase to 500 mg twice daily with food Connie Williamson  Active Self, Pharmacy Records  ondansetron  (ZOFRAN -ODT) 4 MG disintegrating tablet [id number] Yes Take 1 tablet (4 mg total) by mouth every 8 (eight) hours as needed for nausea or vomiting. Connie Williamson  Active Self, Pharmacy Records  oxyCODONE -acetaminophen  (PERCOCET) 5-325 MG tablet [id number] Yes Take 1 tablet by mouth every 8 (eight) hours as needed for up to 5 days for severe pain (pain score 7-10). Connie Williamson  Active   polyethylene glycol powder (MIRALAX ) 17 GM/SCOOP powder [id number] Yes Take 17 g by mouth 2 (two) times daily. Decrease to daily if you develop diarrhea. Connie Williamson  Active Self, Pharmacy Records  predniSONE  (DELTASONE ) 10 MG tablet [id number]  Take 40mg  (4 tablets) a day for 1 week, 30mg  a day (3 tablets) for the second week, 20mg  (2 tablets) a day for the third week, 10mg  (1 tablet) a day for the fourth week.  Patient not taking:  Reported on 01/13/2024   Connie Williamson  Active Self, Pharmacy Records           Med Note (CRUTHIS, SHEFFIELD JAYSON Kitchens Jan 06, 2024 10:03 AM) Pt stated she is taking 10 mg once daily.   rosuvastatin  (CRESTOR ) 5 MG tablet  [id number] Yes Take 1 tablet (5 mg total) by mouth daily. Connie Williamson  Active Self, Pharmacy Records  Semaglutide ,0.25 or 0.5MG /DOS, 2 MG/3ML SOPN [id number] Yes Inject 0.5 mg into the skin once a week. Connie Williamson  Active Self, Pharmacy Records  sodium chloride  flush 0.9 % SOLN injection [id number] Yes 5-10 mLs by Intracatheter route daily for 20 days. Flush the drain with 5 mL normal saline once a day. Measure and document output daily. Connie Williamson  Active             Home Care and Equipment/Supplies: Were Home Health Services Ordered?: No Any new equipment or medical supplies ordered?: No  Functional Questionnaire: Do you need assistance with bathing/showering or dressing?: No Do you need assistance with meal preparation?: No Do you need assistance with eating?: No Do you have difficulty maintaining continence: No Do you need assistance with getting out of bed/getting out of a chair/moving?: No Do you have difficulty managing or taking your medications?: No  Follow up appointments reviewed: PCP Follow-up appointment confirmed?: Yes Date of PCP follow-up appointment?: 01/27/24 Follow-up Provider: Folashade Paseda, [facility] Follow-up appointment confirmed?: Yes Date of Specialist follow-up appointment?: 01/16/24 Follow-Up Specialty Provider:: Patient has follow up visit with  GYN on 7/0/25, Follow up with gastroenterologist on 01/21/24. Per chart review / after visit summary patient will get a call from the IR office to schedule 10-14 day follow up appointment and general surgeon office follow up in 1 month. Do you need transportation to your follow-up appointment?: No Do you understand care options if your condition(s) worsen?: Yes-patient verbalized understanding  SDOH Interventions Today    Flowsheet Row Most Recent Value  SDOH Interventions   Food Insecurity Interventions Intervention Not Indicated  Housing Interventions  Intervention Not Indicated  Transportation Interventions Intervention Not Indicated  Utilities Interventions Intervention Not Indicated    Bridgett Hattabaugh RN, BSN, CCM [place]  [facility], Population Health Case Manager Phone: [phone]

## 2024-01-13 NOTE — Patient Instructions (Signed)
 Visit Information  Thank you for taking time to visit with me today. Please don't hesitate to contact me if I can be of assistance.   Patient instructions:  Advised to call interventional radiology office today to report inability to flush drain.  Confirmed patient has contact name and phone number to interventional radiology office.  Keep an eye are the drain site for signs of infection:  redness, drainage, swelling, increase pain, fever.  Contact interventional radiology office/ provider office as soon as possible.  Follow up at hospital ED for worsening/ severe issues with drain Notify provider for frequent blood sugars <70 or >250/300.  Change dressing to drain area as recommended by provider. Wash hands thoroughly before changing dressing to avoid introducing germs/ bacteria to area Empty drain as recommended by provider and record amount emptied.  See discharge summary / after visit summary to record amount emptied/.   Patient verbalizes understanding of instructions and care plan provided today and agrees to view in MyChart. Active MyChart status and patient understanding of how to access instructions and care plan via MyChart confirmed with patient.     The patient has been provided with contact information for the care management team and has been advised to call with any health related questions or concerns.   Please call the care guide team at [phone] if you need to cancel or reschedule your appointment.   Please call the Suicide and Crisis Lifeline: 988 call 1-800-273-TALK (toll free, 24 hour hotline) if you are experiencing a Mental Health or Behavioral Health Crisis or need someone to talk to.  Arvin Seip RN, BSN, CCM [employer], Population Health Case Manager Phone: [phone]

## 2024-01-14 ENCOUNTER — Telehealth: Payer: Self-pay | Admitting: *Deleted

## 2024-01-14 NOTE — Progress Notes (Signed)
 Care Guide Pharmacy Note  01/14/2024 Name: Connie Williamson MRN: [id number] DOB: 1996-06-16  Referred By: Paseda, Folashade R, FNP Reason for referral: Complex Care Management (Initial outreach to schedule referral with PharmD and reschedule with LCSW )   Dalasia D Webb is a 28 y.o. year old female who is a primary care patient of Paseda, Folashade R, FNP.  Hortensia D Schoonmaker was referred to the pharmacist for assistance related to: DMII  Successful contact was made with the patient to discuss pharmacy services including being ready for the pharmacist to call at least 5 minutes before the scheduled appointment time and to have medication bottles and any blood pressure readings ready for review. The patient agreed to meet with the pharmacist via telephone visit on (date/time). 8/18 at 1:00 PM   Harlene Satterfield  [facility], [facility] Guide  Direct Dial: [phone]  Fax [phone]

## 2024-01-14 NOTE — Progress Notes (Signed)
 Complex Care Management Care Guide Note  01/14/2024 Name: Connie Williamson MRN: [id number] DOB: 02-05-1996  Connie Williamson is a 28 y.o. year old female who is a primary care patient of Paseda, Folashade R, FNP and is actively engaged with the care management team. I reached out to Connie Williamson by phone today to assist with re-scheduling  with the Licensed Clinical Child psychotherapist.  Follow up plan: Patient declined to reschedule with LCSW   Harlene Satterfield  [facility], [facility] Guide  Direct Dial: [phone]  Fax [phone]

## 2024-01-15 ENCOUNTER — Telehealth: Payer: Self-pay

## 2024-01-15 NOTE — Transitions of Care (Post Inpatient/ED Visit) (Signed)
 01/15/2024  Patient ID: Connie Williamson, female   DOB: 1996/04/11, 28 y.o.   MRN: [id number]  Situation: Called patient for transition of care on 01/13/24.  Patient voiced having difficulty flushing abdominal drain at that time.     Background: Patient with recent pelvic abscess. Surgical intervention completed.    Assessment: Patient declined 30 day TOC follow up however patient verbalized having difficulty with flushing abdominal drain. Patient was instructed to call interventional radiology for advisement on how to manager drain concerns.  Patient reports she called IR and was given advisement on how to manage her drain issue.  She reports she is able to flush drain without difficulty.    Recommendation: Patient advised to contact interventional radiology for new issues or concerns regarding drain.      Arvin Seip RN, BSN, CCM [employer], Population Health Case Manager Phone: [phone]

## 2024-01-16 ENCOUNTER — Other Ambulatory Visit: Payer: Self-pay | Admitting: Nurse Practitioner

## 2024-01-16 ENCOUNTER — Other Ambulatory Visit: Payer: Self-pay | Admitting: General Surgery

## 2024-01-16 DIAGNOSIS — R1030 Lower abdominal pain, unspecified: Secondary | ICD-10-CM

## 2024-01-16 DIAGNOSIS — E1165 Type 2 diabetes mellitus with hyperglycemia: Secondary | ICD-10-CM

## 2024-01-19 NOTE — Progress Notes (Signed)
 Referring Physician(s): Almarie Pringle, PA-C; Dr. Bernarda Ned, MD.   Chief Complaint: The patient is seen in follow up today s/p intra-abdominal fluid collection drain placement 01/07/24  History of present illness:  Connie Williamson, 28 year old female, has a medical history significant for Crohn's disease, DM type 1, anxiety/depression, HTN, asthma and uterine fibroids. She was diagnosed with Crohn's disease in 2017 and she underwent small bowel resection with lysis of adhesions in 2018. She was hospitalized March 2025 for Crohn's flare up and was discharged with a steroid taper.   She presented to the ED 01/05/24 with lower abdominal pain, constipation, nausea and vomiting.  She was tachycardic with a WBC over 30,000. CT abdomen/pelvis was positive for a pelvic fluid collection at the site of previous ileocolonic anastomosis. Surgery and GI were consulted and percutaneous drain placement was recommended. The patient was seen in IR 01/07/24 and she received a 10 Fr RLQ drain.   She was discharged from the hospital 01/11/24 with GI, Surgery and IR follow up. She presents to the IR outpatient clinic today for a drain evaluation with CT imaging and possible drain injection under fluoroscopy. She has been flushing the drain once daily with 5 ml NS and reports minimal daily output. She feels well and has no complaints at this time. She is accompanied to the clinic by her mother.   Past Medical History:  Diagnosis Date   Asthma    Crohn's disease (HCC)    Diabetes mellitus    Intramural leiomyoma of uterus    Menorrhagia     Past Surgical History:  Procedure Laterality Date   ADENOIDECTOMY     BOWEL RESECTION     TONSILLECTOMY      Allergies: Patient has no known allergies.  Medications: Prior to Admission medications   Medication Sig Start Date End Date Taking? Authorizing Provider  Continuous Glucose Sensor (DEXCOM G7 SENSOR) MISC Please use as directed. 01/23/23   Paseda,  Folashade R, FNP  ferrous gluconate  (FERGON) 324 MG tablet Take 1 tablet (324 mg total) by mouth daily with breakfast. 01/11/24   Drusilla Sabas RAMAN, MD  FLUoxetine  (PROZAC ) 20 MG tablet Take 1 tablet (20 mg total) by mouth daily. 12/25/23   Paseda, Folashade R, FNP  insulin  degludec (TRESIBA  FLEXTOUCH) 100 UNIT/ML FlexTouch Pen Inject 44 Units into the skin at bedtime. 12/25/23   Paseda, Folashade R, FNP  metFORMIN  (GLUCOPHAGE -XR) 500 MG 24 hr tablet TAKE 1 TABLET (500MG ) BY MOUTH DAILY WITH BREAKFAST, AFTER 1 WEEK INCREASE TO 500 MG TWICE DAILY WITH FOOD 01/16/24   Oley Bascom RAMAN, NP  ondansetron  (ZOFRAN -ODT) 4 MG disintegrating tablet Take 1 tablet (4 mg total) by mouth every 8 (eight) hours as needed for nausea or vomiting. 12/12/23   Dreama Longs, MD  polyethylene glycol powder (MIRALAX ) 17 GM/SCOOP powder Take 17 g by mouth 2 (two) times daily. Decrease to daily if you develop diarrhea. 11/17/23   Briana Elgin LABOR, MD  predniSONE  (DELTASONE ) 10 MG tablet Take 40mg  (4 tablets) a day for 1 week, 30mg  a day (3 tablets) for the second week, 20mg  (2 tablets) a day for the third week, 10mg  (1 tablet) a day for the fourth week. Patient not taking: Reported on 01/13/2024 12/12/23   Dreama Longs, MD  rosuvastatin  (CRESTOR ) 5 MG tablet Take 1 tablet (5 mg total) by mouth daily. 12/27/23 12/26/24  Paseda, Folashade R, FNP  Semaglutide ,0.25 or 0.5MG /DOS, 2 MG/3ML SOPN Inject 0.5 mg into the skin once a week. 12/25/23  Paseda, Folashade R, FNP  sodium chloride  flush 0.9 % SOLN injection Use 5-10 mLs by Intracatheter route daily for 20 days. Flush the drain with 5 mL normal saline once a day. Measure and document output daily. 01/11/24 01/31/24  Han, Aimee H, PA-C     Family History  Problem Relation Age of Onset   Diabetes Mother    Kidney failure Mother    Hypertension Mother    Healthy Father    Healthy Sister    Healthy Brother    Diabetes Maternal Grandmother    Hypertension Maternal Grandmother    Diabetes  Maternal Grandfather    Hypertension Maternal Grandfather     Social History   Socioeconomic History   Marital status: Significant Other    Spouse name: Not on file   Number of children: Not on file   Years of education: Not on file   Highest education level: Associate degree: academic program  Occupational History   Not on file  Tobacco Use   Smoking status: Never   Smokeless tobacco: Never  Vaping Use   Vaping status: Not on file  Substance and Sexual Activity   Alcohol use: Not Currently    Comment: Socially   Drug use: No   Sexual activity: Yes    Birth control/protection: Pill  Other Topics Concern   Not on file  Social History Narrative   Lives with a room mate.   Social Drivers of Corporate investment banker Strain: Low Risk  (12/16/2023)   Received from [facility]   Overall Financial Resource Strain (CARDIA)    Difficulty of Paying Living Expenses: Not very hard  Food Insecurity: No Food Insecurity (01/13/2024)   Hunger Vital Sign    Worried About Running Out of Food in the Last Year: Never true    Ran Out of Food in the Last Year: Never true  Transportation Needs: No Transportation Needs (01/13/2024)   PRAPARE - Administrator, Civil Service (Medical): No    Lack of Transportation (Non-Medical): No  Recent Concern: Transportation Needs - Unmet Transportation Needs (12/16/2023)   Received from [facility]   PRAPARE - Transportation    Lack of Transportation (Medical): Yes    Lack of Transportation (Non-Medical): Yes  Physical Activity: Sufficiently Active (12/16/2023)   Received from [facility]   Exercise Vital Sign    On average, how many days per week do you engage in moderate to strenuous exercise (like a brisk walk)?: 5 days    On average, how many minutes do you engage in exercise at this level?: 30 min  Recent Concern: Physical Activity - Insufficiently Active (12/05/2023)   Exercise Vital Sign    Days of Exercise per Week: 1 day     Minutes of Exercise per Session: 10 min  Stress: Stress Concern Present (12/16/2023)   Received from [facility] - Occupational Stress Questionnaire    Feeling of Stress : To some extent  Social Connections: Socially Integrated (12/16/2023)   Received from [facility]   Social Network    How would you rate your social network (family, work, friends)?: Good participation with social networks     Vital Signs: There were no vitals taken for this visit.  Physical Exam Constitutional:      General: She is not in acute distress.    Appearance: She is not ill-appearing.  Abdominal:     Palpations: Abdomen is soft.  Tenderness: There is no abdominal tenderness.     Comments: RLQ drain to suction. Small amount of thin, pale yellow fluid in bulb.   Skin:    General: Skin is warm and dry.  Neurological:     Mental Status: She is alert and oriented to person, place, and time.  Psychiatric:        Mood and Affect: Mood normal.        Behavior: Behavior normal.        Thought Content: Thought content normal.        Judgment: Judgment normal.     Imaging: No results found.  Labs:  CBC: Recent Labs    01/08/24 0522 01/09/24 0552 01/10/24 0534 01/10/24 1550 01/11/24 0541  WBC 14.0* 11.5* 10.0  --  10.1  HGB 7.9* 7.0* 6.6* 8.9* 8.2*  HCT 27.3* 25.4* 23.7* 30.4* 28.1*  PLT 477* 455* 420*  --  433*    COAGS: No results for input(s): INR, APTT in the last 8760 hours.  BMP: Recent Labs    01/08/24 1215 01/09/24 0552 01/10/24 0534 01/11/24 0541  NA 134* 138 135 135  K 3.8 3.4* 3.6 3.6  CL 103 104 99 98  CO2 24 25 25 27   GLUCOSE 158* 93 79 148*  BUN <5* <5* <5* <5*  CALCIUM  8.1* 7.9* 8.0* 8.0*  CREATININE 0.65 0.53 0.46 0.57  GFRNONAA >60 >60 >60 >60    LIVER FUNCTION TESTS: Recent Labs    11/17/23 0602 12/12/23 1119 01/05/24 2236 01/06/24 0532  BILITOT 0.7 <0.2 1.4* 1.0  AST 5* 6* <5* 7*  ALT 8 5 7 8    ALKPHOS 80 99 149* 122  PROT 6.9 6.9 9.1* 7.3  ALBUMIN 2.7* 3.5 3.3* 2.8*    Assessment:  History of Crohn's with small bowel resection in 2018. Admitted May 2025 for Crohn's flare up and there was a later development of a pelvic fluid collection near the anastomosis. 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 The drain was switched from bulb suction to gravity. A new stat lock and dressing were applied to the drain site.  She was instructed to discontinue drain flushes. She will follow up with IR in 2-3 weeks for a repeat drain evaluation with contrast injection.   She is scheduled for a follow up visit with her GI doctor tomorrow.    Signed: Warren JONELLE Dais, NP 01/19/2024, 11:58 AM   Please refer to Dr. Jennefer attestation of this note for management and plan.

## 2024-01-20 ENCOUNTER — Ambulatory Visit
Admission: RE | Admit: 2024-01-20 | Discharge: 2024-01-20 | Disposition: A | Discharge status: Home / Self Care | Source: Ambulatory Visit | Attending: General Surgery | Admitting: General Surgery

## 2024-01-20 ENCOUNTER — Other Ambulatory Visit: Payer: Self-pay | Admitting: General Surgery

## 2024-01-20 ENCOUNTER — Inpatient Hospital Stay
Admission: RE | Admit: 2024-01-20 | Discharge: 2024-01-20 | Disposition: A | Discharge status: Home / Self Care | Source: Ambulatory Visit | Attending: Student

## 2024-01-20 ENCOUNTER — Encounter: Payer: Self-pay | Admitting: Radiology

## 2024-01-20 DIAGNOSIS — R1030 Lower abdominal pain, unspecified: Secondary | ICD-10-CM

## 2024-01-20 HISTORY — PX: IR RADIOLOGIST EVAL & MGMT: IMG5224

## 2024-01-20 MED ORDER — IOPAMIDOL (ISOVUE-300) INJECTION 61%
100.0000 mL | Freq: Once | INTRAVENOUS | Status: AC | PRN
  Administered 2024-01-20: 100 mL via INTRAVENOUS

## 2024-01-27 ENCOUNTER — Ambulatory Visit: Admitting: Nurse Practitioner

## 2024-02-05 ENCOUNTER — Ambulatory Visit
Admission: RE | Admit: 2024-02-05 | Discharge: 2024-02-05 | Disposition: A | Discharge status: Home / Self Care | Source: Ambulatory Visit | Attending: General Surgery | Admitting: General Surgery

## 2024-02-05 ENCOUNTER — Other Ambulatory Visit: Payer: Self-pay | Admitting: Interventional Radiology

## 2024-02-05 DIAGNOSIS — R1030 Lower abdominal pain, unspecified: Secondary | ICD-10-CM

## 2024-02-05 DIAGNOSIS — N739 Female pelvic inflammatory disease, unspecified: Secondary | ICD-10-CM

## 2024-02-05 HISTORY — PX: IR RADIOLOGIST EVAL & MGMT: IMG5224

## 2024-02-05 MED ORDER — IOPAMIDOL (ISOVUE-300) INJECTION 61%
100.0000 mL | Freq: Once | INTRAVENOUS | Status: AC | PRN
  Administered 2024-02-05: 100 mL via INTRAVENOUS

## 2024-02-05 MED ORDER — IOPAMIDOL (ISOVUE-300) INJECTION 61%
30.0000 mL | Freq: Once | INTRAVENOUS | Status: AC | PRN
  Administered 2024-02-05: 7 mL

## 2024-02-05 NOTE — Progress Notes (Signed)
 Chief Complaint: The patient is seen in follow up today s/p intra-abdominal fluid collection drain placement 01/07/24   Referring Physician(s): Almarie Pringle, PA-C; Dr. Bernarda Ned, MD.   Supervising Physician: Johann Sieving  History of Present Illness: Connie Williamson is a 28 y.o. female has a medical history significant for Crohn's disease, DM type 1, anxiety/depression, HTN, asthma and uterine fibroids. She was diagnosed with Crohn's disease in 2017 and she underwent small bowel resection with lysis of adhesions in 2018. She was hospitalized March 2025 for Crohn's flare up and was discharged with a steroid taper.    She presented to the ED 01/05/24 with lower abdominal pain, constipation, nausea and vomiting.  She was tachycardic with a WBC over 30,000. CT abdomen/pelvis was positive for a pelvic fluid collection at the site of previous ileocolonic anastomosis. Surgery and GI were consulted and percutaneous drain placement was recommended. The patient was seen in IR 01/07/24 and she received a 10 Fr RLQ drain.    She was discharged from the hospital 01/11/24 with GI, Surgery and IR follow up.  She was seen in IR clinic on 7/14. 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 The drain was switched from bulb suction to gravity, and patient was instructed to discontinue drain flushes.    She was seen in follow up with GI, Dr. Ladena, who recommended resection of the ileocolic disease with possible additional bowel removed at time of surgery. Patient was informed of the possibility of ostomy creation, and will be scheduled to work around the timing of her Stelara infusions.   She presents to the IR outpatient clinic today for scheduled 2-3 weeks follow up, with  drain evaluation with CT imaging and possible drain injection under fluoroscopy. She has discontinued flushing the drain, and continues to report  minimal output of serosanguinous fluid into collection bag. She feels well and has no complaints at this time. She is accompanied to the clinic by her mother. No other pertinent ROS findings aside from expected drain discomfort.    Past Medical History:  Diagnosis Date   Asthma    Crohn's disease (HCC)    Diabetes mellitus    Intramural leiomyoma of uterus    Menorrhagia     Past Surgical History:  Procedure Laterality Date   ADENOIDECTOMY     BOWEL RESECTION     IR RADIOLOGIST EVAL & MGMT  01/20/2024   TONSILLECTOMY      Allergies: Patient has no known allergies.  Medications: Prior to Admission medications   Medication Sig Start Date End Date Taking? Authorizing Provider  Continuous Glucose Sensor (DEXCOM G7 SENSOR) MISC Please use as directed. 01/23/23   Paseda, Folashade R, FNP  ferrous gluconate  (FERGON) 324 MG tablet Take 1 tablet (324 mg total) by mouth daily with breakfast. 01/11/24   Drusilla Sabas RAMAN, MD  FLUoxetine  (PROZAC ) 20 MG tablet Take 1 tablet (20 mg total) by mouth daily. 12/25/23   Paseda, Folashade R, FNP  insulin  degludec (TRESIBA  FLEXTOUCH) 100 UNIT/ML FlexTouch Pen Inject 44 Units into the skin at bedtime. 12/25/23   Paseda, Folashade R, FNP  metFORMIN  (GLUCOPHAGE -XR) 500 MG 24 hr tablet TAKE 1 TABLET (500MG ) BY MOUTH DAILY WITH BREAKFAST, AFTER 1 WEEK INCREASE TO 500 MG TWICE DAILY WITH FOOD 01/16/24   Oley Bascom RAMAN, NP  ondansetron  (ZOFRAN -ODT) 4 MG disintegrating tablet Take 1 tablet (4 mg total) by mouth every 8 (eight) hours  as needed for nausea or vomiting. 12/12/23   Dreama Longs, MD  polyethylene glycol powder (MIRALAX ) 17 GM/SCOOP powder Take 17 g by mouth 2 (two) times daily. Decrease to daily if you develop diarrhea. 11/17/23   Briana Elgin LABOR, MD  predniSONE  (DELTASONE ) 10 MG tablet Take 40mg  (4 tablets) a day for 1 week, 30mg  a day (3 tablets) for the second week, 20mg  (2 tablets) a day for the third week, 10mg  (1 tablet) a day for the fourth  week. Patient not taking: Reported on 01/13/2024 12/12/23   Dreama Longs, MD  rosuvastatin  (CRESTOR ) 5 MG tablet Take 1 tablet (5 mg total) by mouth daily. 12/27/23 12/26/24  Paseda, Folashade R, FNP  Semaglutide ,0.25 or 0.5MG /DOS, 2 MG/3ML SOPN Inject 0.5 mg into the skin once a week. 12/25/23   Paseda, Folashade R, FNP     Family History  Problem Relation Age of Onset   Diabetes Mother    Kidney failure Mother    Hypertension Mother    Healthy Father    Healthy Sister    Healthy Brother    Diabetes Maternal Grandmother    Hypertension Maternal Grandmother    Diabetes Maternal Grandfather    Hypertension Maternal Grandfather     Social History   Socioeconomic History   Marital status: Significant Other    Spouse name: Not on file   Number of children: Not on file   Years of education: Not on file   Highest education level: Associate degree: academic program  Occupational History   Not on file  Tobacco Use   Smoking status: Never   Smokeless tobacco: Never  Vaping Use   Vaping status: Not on file  Substance and Sexual Activity   Alcohol use: Not Currently    Comment: Socially   Drug use: No   Sexual activity: Yes    Birth control/protection: Pill  Other Topics Concern   Not on file  Social History Narrative   Lives with a room mate.   Social Drivers of Corporate investment banker Strain: Low Risk  (12/16/2023)   [facility]   Overall Financial Resource Strain (CARDIA)    Difficulty of Paying Living Expenses: Not very hard  Food Insecurity: No Food Insecurity (01/13/2024)   Hunger Vital Sign    Worried About Running Out of Food in the Last Year: Never true    Ran Out of Food in the Last Year: Never true  Transportation Needs: No Transportation Needs (01/13/2024)   PRAPARE - Administrator, Civil Service (Medical): No    Lack of Transportation (Non-Medical): No  Recent Concern: Transportation Needs - Unmet Transportation Needs (12/16/2023)    Received from [facility]   PRAPARE - Transportation    Lack of Transportation (Medical): Yes    Lack of Transportation (Non-Medical): Yes  Physical Activity: Sufficiently Active (12/16/2023)   Received from [facility]   Exercise Vital Sign    On average, how many days per week do you engage in moderate to strenuous exercise (like a brisk walk)?: 5 days    On average, how many minutes do you engage in exercise at this level?: 30 min  Recent Concern: Physical Activity - Insufficiently Active (12/05/2023)   Exercise Vital Sign    Days of Exercise per Week: 1 day    Minutes of Exercise per Session: 10 min  Stress: Stress Concern Present (12/16/2023)   Received from [facility] - Occupational  Stress Questionnaire    Feeling of Stress : To some extent  Social Connections: Socially Integrated (12/16/2023)   Received from [facility]   Social Network    How would you rate your social network (family, work, friends)?: Good participation with social networks    Review of Systems: A 12 point ROS discussed and pertinent positives are indicated in the HPI above.  All other systems are negative.  Physical Exam Constitutional:      Appearance: Normal appearance.  Abdominal:     General: Abdomen is flat.     Tenderness: There is no abdominal tenderness.     Comments: RLQ drain appropriately dressed. Dressing is clean, dry, intact. Drain incision site non-tender, without evidence of infection. Retaining suture in place.  Minimal serosanguinous output in collection bag.     Neurological:     Mental Status: She is alert.      Imaging: CT ABDOMEN PELVIS W CONTRAST Result Date: 01/20/2024 CLINICAL DATA:  28 year old female with history of ileocolonic anastomosis complicated postoperatively by abscess formation status post percutaneous drain placement on 01/07/2024. EXAM: CT ABDOMEN AND PELVIS WITH CONTRAST TECHNIQUE: Multidetector CT imaging of the  abdomen and pelvis was performed using the standard protocol following bolus administration of intravenous contrast. RADIATION DOSE REDUCTION: This exam was performed according to the departmental dose-optimization program which includes automated exposure control, adjustment of the mA and/or kV according to patient size and/or use of iterative reconstruction technique. CONTRAST:  100mL ISOVUE -300 IOPAMIDOL  (ISOVUE -300) INJECTION 61% COMPARISON:  01/06/2024, 01/07/2024 FINDINGS: Lower chest: No acute abnormality. Hepatobiliary: No focal liver abnormality is seen. No gallstones, gallbladder wall thickening, or biliary dilatation. Pancreas: Unremarkable. No pancreatic ductal dilatation or surrounding inflammatory changes. Spleen: Normal in size without focal abnormality. Adrenals/Urinary Tract: Adrenal glands are unremarkable. Kidneys are normal, without renal calculi, focal lesion, or hydronephrosis. Bladder is unremarkable. Stomach/Bowel: Stomach is within normal limits. Similar appearing postsurgical changes after ileocolonic anastomosis in the right lower quadrant. No evidence of bowel wall thickening, distention, or inflammatory changes. Vascular/Lymphatic: No significant vascular findings are present. Again seen are a few mildly prominent iliocaval lymph nodes, globally decreased from comparison and likely indicative of resolving inflammatory process. Reproductive: Uterus and bilateral adnexa are unremarkable. Other: Right lower quadrant percutaneous pigtail drainage catheter in unchanged position. No definite evidence of residual fluid collection. Musculoskeletal: No acute or significant osseous findings. IMPRESSION: Resolution of previously visualized pelvic abscess status post percutaneous drain placement. Ester Sides, MD [facility] Electronically Signed   By: Ester Sides M.D.   On: 01/20/2024 15:25   IR Radiologist Eval & Mgmt Result Date:  01/20/2024 EXAM: See EPIC note CHIEF COMPLAINT: See EPIC note HISTORY OF PRESENT ILLNESS: See EPIC note REVIEW OF SYSTEMS: See EPIC note PHYSICAL EXAMINATION: See EPIC note ASSESSMENT AND PLAN: See EPIC note Electronically Signed   By: Ester Sides M.D.   On: 01/20/2024 14:32   DG Sinus/Fist Tube Chk-Non GI Result Date: 01/20/2024 CLINICAL DATA:  Patient with a history of Crohn's with small bowel resection in 2018. Admitted May 2025 for Crohn's flare up with a later development of a pelvic fluid collection near the anastomosis. Drain placement in IR 01/07/24 EXAM: ABSCESS INJECTION COMPARISON:  CT abdomen pelvis 01/20/2024 CONTRAST:  6 mL Isovue -300- administered via the existing percutaneous drain. FLUOROSCOPY TIME:  Radiation exposure index as provided by the fluoroscopic device 4.8 mGy TECHNIQUE: The patient was positioned supine on the fluoroscopy table. A pre-procedural spot fluoroscopic image was  obtained of the right lower quadrant and the existing percutaneous drainage catheter. Multiple spot fluoroscopic and radiographic images were obtained following the injection of a small amount of contrast via the existing percutaneous drainage catheter. 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 The drain was switched from bulb suction to gravity. Patient will follow-up with IR in 2-3 weeks for a repeat drain study. Imaging study performed by Warren Dais, NP Electronically Signed   By: Ester Sides M.D.   On: 01/20/2024 14:32   CT GUIDED PERITONEAL/RETROPERITONEAL FLUID DRAIN BY PERC CATH Result Date: 01/07/2024 INDICATION: 28 year old female with history of ileocolonic anastomosis complicated by postoperative abscess formation. EXAM: CT PERC DRAIN PERITONEAL ABCESS COMPARISON:  01/06/2024 MEDICATIONS: The patient is currently admitted to the hospital and receiving intravenous antibiotics. The antibiotics were administered within an  appropriate time frame prior to the initiation of the procedure. ANESTHESIA/SEDATION: Moderate (conscious) sedation was employed during this procedure. A total of Versed  2 mg and Fentanyl  50 mcg was administered intravenously. Moderate Sedation Time: 14 minutes. The patient's level of consciousness and vital signs were monitored continuously by radiology nursing throughout the procedure under my direct supervision. CONTRAST:  100 mL Omnipaque  300, intravenous COMPLICATIONS: None immediate. PROCEDURE: RADIATION DOSE REDUCTION: This exam was performed according to the departmental dose-optimization program which includes automated exposure control, adjustment of the mA and/or kV according to patient size and/or use of iterative reconstruction technique. Informed written consent was obtained from the patient after a discussion of the risks, benefits and alternatives to treatment. The patient was placed supine on the CT gantry and a pre procedural CT was performed re-demonstrating the known abscess/fluid collection within the lower abdomen. The procedure was planned. A timeout was performed prior to the initiation of the procedure. The right lower quadrant was prepped and draped in the usual sterile fashion. The overlying soft tissues were anesthetized with 1% lidocaine  with epinephrine . Appropriate trajectory was planned with the use of a 22 gauge spinal needle. An 18 gauge trocar needle was advanced into the abscess/fluid collection and a short Amplatz super stiff wire was coiled within the collection. Appropriate positioning was confirmed with a limited CT scan. The tract was serially dilated allowing placement of a 10 [place] all-purpose drainage catheter. Appropriate positioning was confirmed with a limited postprocedural CT scan. Approximately 90 ml of purulent fluid was aspirated. The tube was connected to a bulb suction and sutured in place. A dressing was placed. The patient tolerated the procedure well without  immediate post procedural complication. IMPRESSION: Successful CT guided placement of a 10 French all purpose drain catheter into the lower abdominal abscess with aspiration of 90 mL of purulent fluid. Samples were sent to the laboratory as requested by the ordering clinical team. Ester Sides, MD [facility] Radiology Electronically Signed   By: Ester Sides M.D.   On: 01/07/2024 17:20    Labs:  CBC: Recent Labs    01/08/24 0522 01/09/24 0552 01/10/24 0534 01/10/24 1550 01/11/24 0541  WBC 14.0* 11.5* 10.0  --  10.1  HGB 7.9* 7.0* 6.6* 8.9* 8.2*  HCT 27.3* 25.4* 23.7* 30.4* 28.1*  PLT 477* 455* 420*  --  433*    COAGS: No results for input(s): INR, APTT in the last 8760 hours.  BMP: Recent Labs    01/08/24 1215 01/09/24 0552 01/10/24 0534 01/11/24 0541  NA 134* 138 135 135  K 3.8 3.4* 3.6  3.6  CL 103 104 99 98  CO2 24 25 25 27   GLUCOSE 158* 93 79 148*  BUN <5* <5* <5* <5*  CALCIUM  8.1* 7.9* 8.0* 8.0*  CREATININE 0.65 0.53 0.46 0.57  GFRNONAA >60 >60 >60 >60    LIVER FUNCTION TESTS: Recent Labs    11/17/23 0602 12/12/23 1119 01/05/24 2236 01/06/24 0532  BILITOT 0.7 <0.2 1.4* 1.0  AST 5* 6* <5* 7*  ALT 8 5 7 8   ALKPHOS 80 99 149* 122  PROT 6.9 6.9 9.1* 7.3  ALBUMIN 2.7* 3.5 3.3* 2.8*    TUMOR MARKERS: No results for input(s): AFPTM, CEA, CA199, CHROMGRNA in the last 8760 hours.  Assessment:   History of Crohn's with small bowel resection in 2018. Admitted May 2025 for Crohn's flare up and there was a later development of a pelvic fluid collection near the anastomosis. Drain placement in IR 01/07/24   CT imaging today showed continued to demonstrate resolution of the fluid collection but contrast injection demonstrated contrast within right fallopian tube and uterus, without concerning fluid collection or fistula to bowel. Per Dr. Delinda review and recommendation, patient's drain was removed.     Return to clinic recommendations were given.  Patient was advised to maintain upcoming appointments with PCP, Surgery, and GI services. Patient and her mother both voiced understanding and were amenable to this plan.  IR to sign off.  Electronically Signed: Carlin LABOR Alexandros Ewan PA-C 02/05/2024, 4:38 PM   Please refer to Dr. Delinda attestation of this note for management and plan.

## 2024-02-21 ENCOUNTER — Other Ambulatory Visit: Payer: Self-pay

## 2024-02-24 ENCOUNTER — Other Ambulatory Visit

## 2024-02-25 ENCOUNTER — Ambulatory Visit: Payer: Self-pay | Admitting: Nurse Practitioner

## 2024-03-06 ENCOUNTER — Other Ambulatory Visit: Payer: Self-pay

## 2024-03-06 DIAGNOSIS — E1165 Type 2 diabetes mellitus with hyperglycemia: Secondary | ICD-10-CM

## 2024-03-06 MED ORDER — SEMAGLUTIDE(0.25 OR 0.5MG/DOS) 2 MG/3ML ~~LOC~~ SOPN: 0.5000 mg | PEN_INJECTOR | SUBCUTANEOUS | 0 refills | Status: AC

## 2024-03-10 NOTE — Progress Notes (Deleted)
 03/10/2024 Name: Connie Williamson MRN: [id number] DOB: 08/24/1995  No chief complaint on file.   Connie Williamson is a 28 y.o. year old female who presented for a telephone visit.   They were referred to the pharmacist by their PCP for assistance in managing diabetes. PMH includes HTN, athma, T2DM with hx of DKA, BMI > 30, chron's disease.    Subjective: Patient was last seen by PCP, Lorice Shall, NP , on 12/25/23. At last visit, A1C was 10.9%. She was on a prednisone  taper for Chron's disease. She reported that she was taking Tresiba  40 units daily. She had stopped Ozempic  about 3 weeks prior due to constipation. She also stopped metformin  due to stomach upset. Home BG were ~250s mg/dL. She was instructed to restart Ozempic  and metformin  XR. She was subsequently admitted for Chron's disease and pelvic abscess. At discharge, she was instructed to continue her current diabetes regimen.   Today, patient presents in *** good spirits and presents without *** any assistance. ***Patient is accompanied by ***.    Care Team: Primary Care Provider: Paseda, Folashade R, FNP ; Next Scheduled Visit: *** {careteamprovider:27366}  Medication Access/Adherence  Current Pharmacy:  [id number] [address], [place] - [address] [place] [address] Phone: [phone] Fax: [phone]  [facility] - [facility] [address] [place] [address] Phone: [phone] Fax: [phone]   Patient reports affordability concerns with their medications: {YES/NO:21197} Patient reports access/transportation concerns to their pharmacy: {YES/NO:21197} Patient reports adherence concerns with their medications:  {YES/NO:21197} ***  *** Patient denies adherence with medications, reports missing *** medications *** times per week, on average.   Diabetes:  Current medications: Ozempic  1 mg weekly, metformin  XR 500 mg BID, Tresiba  44 units daily Medications  tried in the past: ***  Current glucose readings: ***  Date of Download: *** % Time CGM is active: ***% Average Glucose: *** mg/dL Glucose Management Indicator: ***  Glucose Variability: *** (goal <36%) Time in Goal:  - Time in range 70-180: ***% - Time above range: ***% - Time below range: ***% Observed patterns:  Patient {Actions; denies-reports:120008} hypoglycemic s/sx including ***dizziness, shakiness, sweating. Patient {Actions; denies-reports:120008} hyperglycemic symptoms including ***polyuria, polydipsia, polyphagia, nocturia, neuropathy, blurred vision.  Current meal patterns:  - Breakfast: *** - Lunch *** - Supper *** - Snacks *** - Drinks ***  Current physical activity: ***  Current medication access support: ***  Objective:  BP Readings from Last 3 Encounters:  01/11/24 (!) 143/98  12/25/23 102/67  12/12/23 115/86    Lab Results  Component Value Date   HGBA1C 10.9 (H) 11/17/2023   HGBA1C 12.6 (A) 01/23/2023   HGBA1C 10.7 (H) 03/05/2022       Latest Ref Rng & Units 01/11/2024    5:41 AM 01/10/2024    5:34 AM 01/09/2024    5:52 AM  BMP  Glucose 70 - 99 mg/dL 851  79  93   BUN 6 - 20 mg/dL <5  <5  <5   Creatinine 0.44 - 1.00 mg/dL 9.42  9.53  9.46   Sodium 135 - 145 mmol/L 135  135  138   Potassium 3.5 - 5.1 mmol/L 3.6  3.6  3.4   Chloride 98 - 111 mmol/L 98  99  104   CO2 22 - 32 mmol/L 27  25  25    Calcium  8.9 - 10.3 mg/dL 8.0  8.0  7.9     Lab Results  Component Value  Date   CHOL 180 12/25/2023   HDL 58 12/25/2023   LDLCALC 100 (H) 12/25/2023   TRIG 123 12/25/2023   CHOLHDL 3.1 12/25/2023    Medications Reviewed Today   Medications were not reviewed in this encounter       Assessment/Plan:   Diabetes: - Currently {CHL Controlled/[phone]} with most recent A1C of *** {Desc; above/below:16086} goal {CCM A1c HNJOD:[id number]}. Medication adherence appears ***. Control is suboptimal due to ***.   - Last UACR *** - Patient  denies personal or family history of multiple endocrine neoplasia type 2, medullary thyroid cancer; personal history of pancreatitis or gallbladder disease.*** - Reviewed long term cardiovascular and renal outcomes of uncontrolled blood sugar - Reviewed goal A1c, goal fasting, and goal 2 hour post prandial glucose - Reviewed hypoglycemia management plan and the rule of 15*** - Reviewed dietary modifications including *** utilizing the healthy plate method, limiting portion size of carbohydrate foods, increasing intake of protein and non-starchy vegetables. Counseled patient to stay hydrated with water throughout the day. - Reviewed lifestyle modifications including: aiming for 150 minutes of moderate intensity exercise every week. *** - Recommend to ***  - Recommend to check glucose twice daily: fasting and 2-hr PPG ***. Counseled patient to bring glucometer or BG log to every appointment. - Next A1C due ***    Meets financial criteria for *** patient assistance program through ***. Will collaborate with provider, CPhT, and patient to pursue assistance.     Written patient instructions provided. Patient verbalized understanding of treatment plan.   Follow Up Plan:  Pharmacist *** PCP clinic visit in 04/01/24   Lorain Baseman, PharmD [facility] [phone]

## 2024-03-11 ENCOUNTER — Other Ambulatory Visit (HOSPITAL_COMMUNITY): Payer: Self-pay

## 2024-03-11 ENCOUNTER — Telehealth: Payer: Self-pay

## 2024-03-11 ENCOUNTER — Other Ambulatory Visit

## 2024-03-11 NOTE — Telephone Encounter (Signed)
 Attempted to contact patient for scheduled appointment for medication management. Left HIPAA compliant message for patient to return my call at their convenience.   Lorain Baseman, PharmD [facility] [phone]

## 2024-03-12 ENCOUNTER — Telehealth: Payer: Self-pay | Admitting: *Deleted

## 2024-03-12 NOTE — Progress Notes (Signed)
 Complex Care Management Care Guide Note  03/12/2024 Name: [name] MRN: [id number] DOB: [birth year]  Connie Williamson Costantino is a 28 y.o. year old female who is a primary care patient of Paseda, Folashade R, FNP and is actively engaged with the care management team. I reached out to Connie Williamson [place] by phone today to assist with re-scheduling  with the Pharmacist.  Follow up plan: Unsuccessful telephone outreach attempt made. A HIPAA compliant phone message was left for the patient providing contact information and requesting a return call.  Harlene Satterfield  [facility]  Value-Based Care Institute, [facility] Guide  Direct Dial: [phone]  Fax [phone]

## 2024-03-16 NOTE — Progress Notes (Signed)
 Complex Care Management Care Guide Note  03/16/2024 Name: JOSEY DETTMANN MRN: [id number] DOB: 1996-05-02  Jodelle JONETTA Tamargo is a 28 y.o. year old female who is a primary care patient of Paseda, Folashade R, FNP and is actively engaged with the care management team. I reached out to Jodelle JONETTA [place] by phone today to assist with re-scheduling  with the Pharmacist.  Follow up plan: Unsuccessful telephone outreach attempt made. A HIPAA compliant phone message was left for the patient providing contact information and requesting a return call. No further outreach attempts will be made at this time. We have been unable to contact the patient to reschedule for complex care management services.   Harlene Satterfield  [facility]  Value-Based Care Institute, [facility] Guide  Direct Dial: [phone]  Fax [phone]

## 2024-03-21 ENCOUNTER — Other Ambulatory Visit: Payer: Self-pay | Admitting: Nurse Practitioner

## 2024-03-21 DIAGNOSIS — F32A Depression, unspecified: Secondary | ICD-10-CM

## 2024-03-23 NOTE — Telephone Encounter (Signed)
 Please advise [facility]

## 2024-03-24 ENCOUNTER — Other Ambulatory Visit: Payer: Self-pay | Admitting: Nurse Practitioner

## 2024-03-24 DIAGNOSIS — E1165 Type 2 diabetes mellitus with hyperglycemia: Secondary | ICD-10-CM

## 2024-03-24 NOTE — Telephone Encounter (Signed)
 Please advise [facility]

## 2024-03-27 ENCOUNTER — Telehealth: Payer: Self-pay

## 2024-03-27 NOTE — Transitions of Care (Post Inpatient/ED Visit) (Signed)
   03/27/2024  Name: Michaela D Kimberling MRN: [id number] DOB: [birth year]  Today's TOC FU Call Status: Today's TOC FU Call Status:: Unsuccessful Call (1st Attempt) Unsuccessful Call (1st Attempt) Date: 03/27/24  Attempted to reach the patient regarding the most recent Inpatient/ED visit.  Follow Up Plan: Additional outreach attempts will be made to reach the patient to complete the Transitions of Care (Post Inpatient/ED visit) call.   Garmon Dehn J. Kylee Nardozzi RN, MSN [facility], [facility] RN Care Manager Direct Dial: [phone]  Fax: [phone] Website: [email or web address]

## 2024-03-30 ENCOUNTER — Telehealth: Payer: Self-pay

## 2024-03-30 NOTE — Transitions of Care (Post Inpatient/ED Visit) (Signed)
   03/30/2024  Name: Connie Williamson MRN: [id number] DOB: 1995/12/11  Today's TOC FU Call Status: Today's TOC FU Call Status:: Unsuccessful Call (2nd Attempt) Unsuccessful Call (2nd Attempt) Date: 03/30/24  Attempted to reach the patient regarding the most recent Inpatient/ED visit.  Follow Up Plan: Additional outreach attempts will be made to reach the patient to complete the Transitions of Care (Post Inpatient/ED visit) call.   Arvin Seip RN, BSN, CCM [employer], Population Health Case Manager Phone: [phone]

## 2024-03-31 ENCOUNTER — Telehealth: Payer: Self-pay

## 2024-03-31 NOTE — Transitions of Care (Post Inpatient/ED Visit) (Signed)
   03/31/2024  Name: Connie Williamson MRN: [id number] DOB: Feb 22, 1996  Today's TOC FU Call Status: Today's TOC FU Call Status:: Unsuccessful Call (3rd Attempt) Unsuccessful Call (3rd Attempt) Date: 03/31/24  Attempted to reach the patient regarding the most recent Inpatient/ED visit.  Follow Up Plan: No further outreach attempts will be made at this time. We have been unable to contact the patient.  Arvin Seip RN, BSN, CCM [employer], Population Health Case Manager Phone: [phone]

## 2024-04-01 ENCOUNTER — Ambulatory Visit: Payer: Self-pay | Admitting: Nurse Practitioner

## 2024-04-03 ENCOUNTER — Ambulatory Visit: Admitting: Nurse Practitioner

## 2024-06-09 ENCOUNTER — Other Ambulatory Visit: Payer: Self-pay

## 2024-06-09 ENCOUNTER — Encounter (HOSPITAL_BASED_OUTPATIENT_CLINIC_OR_DEPARTMENT_OTHER): Payer: Self-pay | Admitting: Emergency Medicine

## 2024-06-09 ENCOUNTER — Emergency Department (HOSPITAL_BASED_OUTPATIENT_CLINIC_OR_DEPARTMENT_OTHER)
Admission: EM | Admit: 2024-06-09 | Discharge: 2024-06-10 | Disposition: A | Attending: Emergency Medicine | Admitting: Emergency Medicine

## 2024-06-09 ENCOUNTER — Emergency Department (HOSPITAL_BASED_OUTPATIENT_CLINIC_OR_DEPARTMENT_OTHER)

## 2024-06-09 DIAGNOSIS — R7309 Other abnormal glucose: Secondary | ICD-10-CM | POA: Diagnosis not present

## 2024-06-09 DIAGNOSIS — L03311 Cellulitis of abdominal wall: Secondary | ICD-10-CM

## 2024-06-09 DIAGNOSIS — R1012 Left upper quadrant pain: Secondary | ICD-10-CM | POA: Insufficient documentation

## 2024-06-09 DIAGNOSIS — R109 Unspecified abdominal pain: Secondary | ICD-10-CM | POA: Diagnosis present

## 2024-06-09 LAB — COMPREHENSIVE METABOLIC PANEL WITH GFR
ALT: 6 U/L (ref 0–44)
AST: 19 U/L (ref 15–41)
Albumin: 4.2 g/dL (ref 3.5–5.0)
Alkaline Phosphatase: 137 U/L — ABNORMAL HIGH (ref 38–126)
Anion gap: 13 (ref 5–15)
BUN: 12 mg/dL (ref 6–20)
CO2: 23 mmol/L (ref 22–32)
Calcium: 9.4 mg/dL (ref 8.9–10.3)
Chloride: 96 mmol/L — ABNORMAL LOW (ref 98–111)
Creatinine, Ser: 0.82 mg/dL (ref 0.44–1.00)
GFR, Estimated: >60 mL/min (ref >60)
Glucose, Bld: 421 mg/dL — ABNORMAL HIGH (ref 70–99)
Potassium: 4.9 mmol/L (ref 3.5–5.1)
Sodium: 131 mmol/L — ABNORMAL LOW (ref 135–145)
Total Bilirubin: 0.2 mg/dL (ref 0.0–1.2)
Total Protein: 7.8 g/dL (ref 6.5–8.1)

## 2024-06-09 LAB — URINALYSIS, ROUTINE W REFLEX MICROSCOPIC
Bacteria, UA: NONE SEEN
Bilirubin Urine: NEGATIVE
Glucose, UA: >1000 mg/dL — AB
Ketones, ur: NEGATIVE mg/dL
Leukocytes,Ua: NEGATIVE
Nitrite: NEGATIVE
Protein, ur: NEGATIVE mg/dL
Specific Gravity, Urine: 1.038 — ABNORMAL HIGH (ref 1.005–1.030)
pH: 6.5 (ref 5.0–8.0)

## 2024-06-09 LAB — CBC
HCT: 36.1 % (ref 36.0–46.0)
Hemoglobin: 11.1 g/dL — ABNORMAL LOW (ref 12.0–15.0)
MCH: 23.9 pg — ABNORMAL LOW (ref 26.0–34.0)
MCHC: 30.7 g/dL (ref 30.0–36.0)
MCV: 77.8 fL — ABNORMAL LOW (ref 80.0–100.0)
Platelets: 422 K/uL — ABNORMAL HIGH (ref 150–400)
RBC: 4.64 MIL/uL (ref 3.87–5.11)
RDW: 15.9 % — ABNORMAL HIGH (ref 11.5–15.5)
WBC: 7.8 K/uL (ref 4.0–10.5)
nRBC: 0 % (ref 0.0–0.2)

## 2024-06-09 LAB — LIPASE, BLOOD: Lipase: 33 U/L (ref 11–51)

## 2024-06-09 LAB — PREGNANCY, URINE: Preg Test, Ur: NEGATIVE

## 2024-06-09 MED ORDER — ONDANSETRON HCL 4 MG/2ML IJ SOLN
4.0000 mg | Freq: Once | INTRAMUSCULAR | Status: AC
  Administered 2024-06-09: 4 mg via INTRAVENOUS
  Filled 2024-06-09: qty 2

## 2024-06-09 MED ORDER — IOHEXOL 300 MG/ML  SOLN
100.0000 mL | Freq: Once | INTRAMUSCULAR | Status: AC | PRN
  Administered 2024-06-09: 100 mL via INTRAVENOUS

## 2024-06-09 MED ORDER — MORPHINE SULFATE (PF) 4 MG/ML IV SOLN
4.0000 mg | Freq: Once | INTRAVENOUS | Status: AC
  Administered 2024-06-09: 4 mg via INTRAVENOUS
  Filled 2024-06-09: qty 1

## 2024-06-09 MED ORDER — PIPERACILLIN-TAZOBACTAM 3.375 G IVPB 30 MIN
3.3750 g | Freq: Once | INTRAVENOUS | Status: AC
  Administered 2024-06-10: 3.375 g via INTRAVENOUS
  Filled 2024-06-09: qty 50

## 2024-06-09 NOTE — ED Notes (Signed)
 Patient transported to CT

## 2024-06-09 NOTE — ED Provider Notes (Signed)
 [place] EMERGENCY DEPARTMENT AT [facility] Provider Note   CSN: [id number] Arrival date & time: 06/09/24  2127     Patient presents with: Post-op Problem   Connie Williamson is a 28 y.o. female.   28 yo F with a cc of abdominal pain.  Patient having pain for couple days.  She recently had a abdominal surgery.  Had her staples removed and said she had some dehiscence of her wound.  Was told that it would close on its own and it did but she had an episode where she retched and with that had reopening of the wound.  She had a little bit more drainage from it today than she did previously.  Has follow-up with her surgeon in a few days but decided to come to the ED for evaluation.  No persistent fevers no vomiting.        Prior to Admission medications   Medication Sig Start Date End Date Taking? Authorizing Provider  Continuous Glucose Sensor (DEXCOM G7 SENSOR) MISC Please use as directed. 01/23/23   Paseda, Folashade R, FNP  ferrous gluconate  (FERGON) 324 MG tablet Take 1 tablet (324 mg total) by mouth daily with breakfast. 01/11/24   Drusilla Sabas RAMAN, MD  FLUoxetine  (PROZAC ) 20 MG tablet TAKE 1 TABLET BY MOUTH EVERY DAY 03/23/24   Paseda, Folashade R, FNP  insulin  degludec (TRESIBA  FLEXTOUCH) 100 UNIT/ML FlexTouch Pen Inject 44 Units into the skin at bedtime. 12/25/23   Paseda, Folashade R, FNP  metFORMIN  (GLUCOPHAGE -XR) 500 MG 24 hr tablet TAKE 1 TABLET (500MG ) BY MOUTH DAILY WITH BREAKFAST, AFTER 1 WEEK INCREASE TO 500 MG TWICE DAILY WITH FOOD 01/16/24   Oley Bascom RAMAN, NP  ondansetron  (ZOFRAN -ODT) 4 MG disintegrating tablet Take 1 tablet (4 mg total) by mouth every 8 (eight) hours as needed for nausea or vomiting. 12/12/23   Dreama Longs, MD  polyethylene glycol powder (MIRALAX ) 17 GM/SCOOP powder Take 17 g by mouth 2 (two) times daily. Decrease to daily if you develop diarrhea. 11/17/23   Briana Elgin LABOR, MD  predniSONE  (DELTASONE ) 10 MG tablet Take 40mg  (4 tablets) a day for  1 week, 30mg  a day (3 tablets) for the second week, 20mg  (2 tablets) a day for the third week, 10mg  (1 tablet) a day for the fourth week. Patient not taking: Reported on 01/13/2024 12/12/23   Dreama Longs, MD  rosuvastatin  (CRESTOR ) 5 MG tablet Take 1 tablet (5 mg total) by mouth daily. 12/27/23 12/26/24  Paseda, Folashade R, FNP  Semaglutide , 1 MG/DOSE, (OZEMPIC , 1 MG/DOSE,) 4 MG/3ML SOPN INJECT 1 MG ONCE A WEEK AS DIRECTED 03/24/24   Paseda, Folashade R, FNP  Semaglutide ,0.25 or 0.5MG /DOS, 2 MG/3ML SOPN Inject 0.5 mg into the skin once a week. 03/06/24   Paseda, Folashade R, FNP    Allergies: Patient has no known allergies.    Review of Systems  Updated Vital Signs BP 132/81 (BP Location: Right Arm)   Pulse 90   Temp 98.6 F (37 C) (Oral)   Resp 20   Ht 5' 3 (1.6 m)   Wt 90.4 kg   LMP 06/09/2024 (Approximate)   SpO2 100%   BMI 35.30 kg/m   Physical Exam Vitals and nursing note reviewed.  Constitutional:      General: She is not in acute distress.    Appearance: She is well-developed. She is not diaphoretic.  HENT:     Head: Normocephalic and atraumatic.  Eyes:     Pupils: Pupils are equal,  round, and reactive to light.  Cardiovascular:     Rate and Rhythm: Normal rate and regular rhythm.     Heart sounds: No murmur heard.    No friction rub. No gallop.  Pulmonary:     Effort: Pulmonary effort is normal.     Breath sounds: No wheezing or rales.  Abdominal:     General: There is no distension.     Palpations: Abdomen is soft.     Tenderness: There is no abdominal tenderness.     Comments: Patient has a very small opening to her midline abdominal incision just inferior to the umbilicus.  I am able to express a very small amount of serous drainage.  No obvious surrounding erythema or induration.  Tells me she has pain to the left lower quadrant but no obvious pain or guarding on exam.  Musculoskeletal:        General: No tenderness.     Cervical back: Normal range of motion  and neck supple.  Skin:    General: Skin is warm and dry.  Neurological:     Mental Status: She is alert and oriented to person, place, and time.  Psychiatric:        Behavior: Behavior normal.     (all labs ordered are listed, but only abnormal results are displayed) Labs Reviewed  COMPREHENSIVE METABOLIC PANEL WITH GFR - Abnormal; Notable for the following components:      Result Value   Sodium 131 (*)    Chloride 96 (*)    Glucose, Bld 421 (*)    Alkaline Phosphatase 137 (*)    All other components within normal limits  CBC - Abnormal; Notable for the following components:   Hemoglobin 11.1 (*)    MCV 77.8 (*)    MCH 23.9 (*)    RDW 15.9 (*)    Platelets 422 (*)    All other components within normal limits  LIPASE, BLOOD  PREGNANCY, URINE  URINALYSIS, ROUTINE W REFLEX MICROSCOPIC    EKG: None  Radiology: No results found.   Procedures   Medications Ordered in the ED  iohexol  (OMNIPAQUE ) 300 MG/ML solution 100 mL (has no administration in time range)  morphine  (PF) 4 MG/ML injection 4 mg (4 mg Intravenous Given 06/09/24 2221)  ondansetron  (ZOFRAN ) injection 4 mg (4 mg Intravenous Given 06/09/24 2220)                                    Medical Decision Making Amount and/or Complexity of Data Reviewed Labs: ordered. Radiology: ordered.  Risk Prescription drug management.   28 yo F with a recent ileocolectomy small bowel resection right oophorectomy about 3 weeks ago comes in with a chief complaints of dehiscence of her midline abdominal wound and left lower quadrant abdominal pain  Pain fairly mild on exam.  Wound with granulation tissue without obvious purulent drainage.  Discussed different options with her including waiting for her Friday appointment.  Electing for CT imaging at this time.  Patient care signed out for Dr. Jerrol, please see their note for further details of care in the ED.  The patients results and plan were reviewed and discussed.    Any x-rays performed were independently reviewed by myself.   Differential diagnosis were considered with the presenting HPI.  Medications  iohexol  (OMNIPAQUE ) 300 MG/ML solution 100 mL (has no administration in time range)  morphine  (PF) 4 MG/ML  injection 4 mg (4 mg Intravenous Given 06/09/24 2221)  ondansetron  (ZOFRAN ) injection 4 mg (4 mg Intravenous Given 06/09/24 2220)    Vitals:   06/09/24 2139 06/09/24 2143  BP:  132/81  Pulse:  90  Resp:  20  Temp:  98.6 F (37 C)  TempSrc:  Oral  SpO2:  100%  Weight: 90.4 kg   Height: 5' 3 (1.6 m)     Final diagnoses:  Left upper quadrant abdominal pain    Admission/ observation were discussed with the admitting physician, patient and/or family and they are comfortable with the plan.       Final diagnoses:  Left upper quadrant abdominal pain    ED Discharge Orders     None          Emil Share, DO 06/09/24 2307

## 2024-06-09 NOTE — ED Provider Notes (Signed)
  Physical Exam  BP 132/81 (BP Location: Right Arm)   Pulse 90   Temp 98.6 F (37 C) (Oral)   Resp 20   Ht 5' 3 (1.6 m)   Wt 90.4 kg   LMP 06/09/2024 (Approximate)   SpO2 100%   BMI 35.30 kg/m   Physical Exam  Procedures  Procedures  ED Course / MDM    Medical Decision Making Amount and/or Complexity of Data Reviewed Labs: ordered. Radiology: ordered.  Risk Prescription drug management.   33F presenting with abd pain related to her incisions, had ileocolectomy revision, had a R ovarian abscess that was also removed. Had dehiscence of her abdominal wound, has now opened again. Getting CT imaging to further evaluate.   Labs: CBC without a leukocytosis, hemoglobin 11.1, CMP without significant electrolyte abnormality, mild hyponatremia to 131, creatinine normal, lipase normal, urinalysis no evidence of UTI, urine pregnancy negative.  CT Abd Pel:  IMPRESSION:  1. Soft tissue thickening, fat stranding, and edema along the ventral midline  incision with trace intraperitoneal fluid deep to the incision. Differential  diagnosis is expected postoperative changes versus cellulitis. No drainable  abscess or soft tissue gas.  2. Marked thickening of the endometrium measuring 29 mm; correlate clinically  with menstrual status and consider gynecologic evaluation and/or pelvic  ultrasound for further assessment.   Patient examined bedside, mucousy discharge from her incision site, not overtly purulent but appears to be possibly developing infection.  Will treat for early cellulitis, administered a dose of IV Zosyn. General surgery consulted due to report of trace intraperitoneal fluid deep to the incision.  General Surgery: Dr. Mabel Essex of colorectal surgery, who accepted the patient in transfer to [facility]. EMTALA completed and the patient was updated regarding the plan of care.        Jerrol Agent, MD 06/10/24 [phone]

## 2024-06-09 NOTE — ED Triage Notes (Signed)
 Pt via pov from home with abdominal pain related to her incisions from having ovary removal and bowel resection. Pt states she was doing well, but began throwing up and felt something pop. She states she began to have oozing from incision today with foul smell. Pt a&o x 4; nad noted.

## 2024-06-10 DIAGNOSIS — Z743 Need for continuous supervision: Secondary | ICD-10-CM | POA: Diagnosis not present

## 2024-06-10 DIAGNOSIS — R1012 Left upper quadrant pain: Secondary | ICD-10-CM | POA: Diagnosis not present

## 2024-06-10 LAB — CBG MONITORING, ED: Glucose-Capillary: 257 mg/dL — ABNORMAL HIGH (ref 70–99)

## 2024-06-10 MED ORDER — HYDROMORPHONE HCL 1 MG/ML IJ SOLN
1.0000 mg | Freq: Once | INTRAMUSCULAR | Status: AC
  Administered 2024-06-10: 1 mg via INTRAVENOUS
  Filled 2024-06-10: qty 1

## 2024-06-10 MED ORDER — LACTATED RINGERS IV BOLUS
1000.0000 mL | Freq: Once | INTRAVENOUS | Status: AC
  Administered 2024-06-10: 1000 mL via INTRAVENOUS

## 2024-06-10 MED ORDER — INSULIN GLARGINE-YFGN 100 UNIT/ML ~~LOC~~ SOLN
40.0000 [IU] | Freq: Once | SUBCUTANEOUS | Status: AC
  Administered 2024-06-10: 40 [IU] via SUBCUTANEOUS
  Filled 2024-06-10: qty 400

## 2024-06-10 NOTE — Discharge Summary (Signed)
 [facility]  [facility] Inpatient Discharge Summary  PCP: Folashade Paseda, FNP Discharge Details   Admit date:         06/10/2024 Discharge date:        06/10/2024  Hospital Days:    1 days  Code Status:   Full Code Advanced Directives on file: No Directive        Discharge Diagnoses:  Principal Problem:   Cellulitis Active Problems:   Postoperative wound dehiscence, skin and subcutaneous tissue   Follow-Up Appointments Suggested: [facility] [place] [address] [place]  [id number] [phone]  Follow-up in the office with Dr. Ladena in the [place] location this Friday as scheduled.  Folashade R Paseda, FNP [address], [address] [place] [address] [phone]     Follow-Up Appointments Already Scheduled: Future Appointments  Date Time Provider Department Center  06/12/2024  9:15 AM Alm JINNY Ladena, MD [facility] None    Discharge Medications:   Current Discharge Medication List     START taking these medications      Details  oxyCODONE -acetaminophen  5-325 mg per tablet Commonly known as: PERCOCET,ENDOCET  Take one tablet by mouth every 4 (four) hours as needed for Pain for up to 5 days. Max Daily Amount: 6 tablets Quantity: 12 tablet       CONTINUE these medications which have CHANGED      Details  VITAMIN D3 GUMMIES 25 MCG (1000 UT) Chew Generic drug: Cholecalciferol What changed: Another medication with the same name was removed. Continue taking this medication, and follow the directions you see here.  Chew one tablet (1,000 Units dose) by mouth daily.       CONTINUE these medications which have NOT CHANGED      Details  acetaminophen  500 mg tablet Commonly known as: TYLENOL   Take two tablets (1,000 mg dose) by mouth every 8 (eight) hours as needed for Pain.   dicyclomine 10 mg capsule Commonly known as: BENTYL  Take twenty mg by  mouth 4 (four) times daily. Quantity: 120 capsule   FLUoxetine  20 MG tablet Commonly known as: PROZAC   Take one tablet (20 mg dose) by mouth daily.   rosuvastatin  calcium  5 mg tablet Commonly known as: CRESTOR   Take one tablet (5 mg dose) by mouth daily.   STELARA IV  Inject into the vein Every 2 Months.   TRESIBA  FLEXTOUCH 100 UNIT/ML injection Generic drug: Insulin  Degludec  Inject forty Units into the skin at bedtime.   vitamin B-12 1000 mcg tablet Commonly known as: CYANOCOBALAMIN  Take one tablet (1,000 mcg dose) by mouth daily.      * You might also be taking other medications not listed above. If you have questions about any of your other medications, talk to the person who prescribed them or your Primary Care Provider.          STOP taking these medications    erythromycin base 500 mg tablet Commonly known as: E-MYCIN   norethindrone-ethinyl estradiol-ferrous fumarate 1-20 mg-mcg per tablet Commonly known as: LOESTRIN FE 1/20       ASK your doctor about these medications      Details  metFORMIN  ER 500 mg 24 hr tablet Commonly known as: GLUCOPHAGE -XR  Take one tablet (500 mg dose) by mouth 2 (two) times daily.        Allergies: Allergies[1]  Consultations this Admission: IP CONSULT TO IV TEAM  Procedures/Imaging:  No orders to display    Pertinent Labs:  Cardiac Labs: No results for input(s): CK, CKMB, CTNI, BNP in the last 168 hours. CBC: Recent Labs    Units 06/10/24 0540  WBC thou/mcL 7.2  HGB gm/dL 9.9*  PLT thou/mcL 521*   BMP: Recent Labs    Units 06/10/24 0540  NA mmol/L 134*  K mmol/L 4.0  CL mmol/L 100  CO2 mmol/L 23  BUN mg/dL 12  CREATININE mg/dL 9.41  MAGNESIUM  mg/dL 1.8   Lipid Panel: No results for input(s): CHOL, TRIG, HDL, LDL in the last 168 hours. Liver Enzymes: No results for input(s): INR, AST, ALT, ALKPHOS, BILITOT in the last 168 hours. Endocrine Panels: Recent Labs     Units 06/10/24 1202 06/10/24 0906 06/10/24 0540  GLUCOSE mg/dL 797* 709* [facility] [place] Course   Physicians involved in care during this hospitalization Attending Provider: Caprice FORBES Essex, MD Admitting Provider: Caprice FORBES Essex, MD  HPI per admitting provider: Patient seen at a freestanding emergency room last night scan suggested some cellulitis.  She is having 10 out of 10 pain reportedly at that time and had some new wound drainage.  I transferred her to [facility] for further management. Hospital Course:    On evaluation her white count is normal and she is afebrile.  There is no cellulitis.  There is a 3 mm opening draining small amount of mucopurulent drainage that does not appear consistent with bowel content.  She is mildly tender in the left side without peritonitis.  She otherwise looks very well.  She is eating and drinking.  Under local anesthetic I opened up the incision by about 2 cm and probed it with a Q-tip which goes in by about 8 cm.  It feels like the underlying fascia is intact and there was no bowel content only mucopurulent type drainage.  This may represent a suture granuloma fluid collection.  I packed the wound with a single piece of quarter inch iodoform gauze and gave her instructions for removal.  She will follow-up with Dr. Ladena as scheduled in the office this week on Friday.  She is stable for discharge.  BP 103/71 (BP Location: Left Upper Arm, Patient Position: Lying)   Pulse 93   Temp 98.5 F (36.9 C) (Oral)   Resp 16   SpO2 99%   Physical Exam Alert, no acute distress. Abdomen soft.  Minimally tender to the left of the midline wound without peritonitis.  No cellulitis.  Midportion of the wound with small opening draining a small amount of mucopurulent drainage that does not appear consistent with bowel content.  Post Hospital Care   Activity: Activity Instructions     Driving Restrictions     No driving on pain pills.    Shower/Bath     OK to shower, no soaking in tub x 2 weeks       Weight Bearing Status:          Oxygen Orders for Discharge: O2 Device: None (Room air) SpO2: 99 %  Diet: Diet and Nourishment Orders (From admission, onward)  Regular   Lines/Drains/Airways: Patient Lines/Drains/Airways Status     Active LDAs     Name Placement date Placement time Site Days   Peripheral IV 20 G Anterior;Right Forearm 06/10/24  0625  Forearm  less than 1                        Home  Health Orders: DME Orders (From admission, onward)    None      Home Health Agency     None       I spent 20 minutes performing discharge services.   Electronically signed: Caprice FORBES Essex, MD 06/10/2024 / 2:21 PM       [1] No Known Allergies *Some images could not be shown.

## 2024-06-11 ENCOUNTER — Telehealth: Payer: Self-pay

## 2024-06-11 NOTE — Transitions of Care (Post Inpatient/ED Visit) (Signed)
   06/11/2024  Name: Connie Williamson MRN: [id number] DOB: 21-May-1996  Today's TOC FU Call Status: Today's TOC FU Call Status:: Unsuccessful Call (1st Attempt) Unsuccessful Call (1st Attempt) Date: 06/11/24  Attempted to reach the patient regarding the most recent Inpatient/ED visit.  Follow Up Plan: Additional outreach attempts will be made to reach the patient to complete the Transitions of Care (Post Inpatient/ED visit) call.   Arvin Seip RN, BSN, CCM [employer], Population Health Case Manager Phone: [phone]

## 2024-06-12 ENCOUNTER — Telehealth: Payer: Self-pay

## 2024-06-12 NOTE — Patient Instructions (Signed)
 Visit Information  Thank you for taking time to visit with me today. Please don't hesitate to contact me if I can be of assistance to you   Patient instructions: Notify provider for any signs of infection: redness, drainage, odor, fever, swelling Keep follow up appointments with provider Seek emergency medical services for severe symptoms Notify provider for any new/ ongoing symptoms.    Patient verbalizes understanding of instructions and care plan provided today and agrees to view in MyChart. Active MyChart status and patient understanding of how to access instructions and care plan via MyChart confirmed with patient.     The patient has been provided with contact information for the care management team and has been advised to call with any health related questions or concerns.   Please call the care guide team at [phone] if you need to cancel or reschedule your appointment.   Please call the Suicide and Crisis Lifeline: 988 call the [place]  National Suicide Prevention Lifeline: 407 258 1612 or TTY: (781) 326-0806 TTY (669) 320-5046) to talk to a trained counselor call 1-800-273-TALK (toll free, 24 hour hotline) if you are experiencing a Mental Health or Behavioral Health Crisis or need someone to talk to.  Arvin Seip RN, BSN, CCM [employer], Population Health Case Manager Phone: [phone]

## 2024-06-12 NOTE — Transitions of Care (Post Inpatient/ED Visit) (Signed)
 06/12/2024  Name: Connie Williamson MRN: [id number] DOB: 1996/04/18  Today's TOC FU Call Status: Today's TOC FU Call Status:: Successful TOC FU Call Completed TOC FU Call Complete Date: 06/12/24  Patient's Name and Date of Birth confirmed. Name, DOB  Transition Care Management Follow-up Telephone Call Date of Discharge: 06/10/24 Discharge Facility: Other [facility]) Name of Other (Non[facility]) Discharge Facility: [facility] Type of Discharge: Emergency Department Primary Inpatient Discharge Diagnosis:: cellulitis How have you been since you were released from the hospital?: Better Any questions or concerns?: No  Items Reviewed: Did you receive and understand the discharge instructions provided?: Yes Medications obtained,verified, and reconciled?: Yes (Medications Reviewed) Any new allergies since your discharge?: No Dietary orders reviewed?: Yes Type of Diet Ordered:: diabetic diet. Do you have support at home?: Yes People in Home [RPT]: other relative(s) Name of Support/Comfort Primary Source: Fatima Hood  Medications Reviewed Today: Medications Reviewed Today     Reviewed by Perl Kerney E, RN (Registered Nurse) on 06/12/24 at 1256  Med List Status: <None>   Medication Order Taking? Sig Documenting Provider Last Dose Status Informant  acetaminophen  (TYLENOL ) 500 MG tablet [id number] Yes Take 1,000 mg by mouth every 8 (eight) hours as needed. [provider]  Active   Cholecalciferol (VITAMIN D3 GUMMIES) 25 MCG (1000 UT) CHEW [id number] Yes Chew by mouth daily. [provider]  Active   Continuous Glucose Sensor (DEXCOM G7 SENSOR) MISC [id number] Yes Please use as directed. Paseda, Folashade R, FNP  Active Self, Pharmacy Records  cyanocobalamin (VITAMIN B12) 1000 MCG tablet [id number] Yes Take 1,000 mcg by mouth daily. [provider]  Active   dicyclomine (BENTYL) 10 MG capsule [id number] Yes Take 10 mg by mouth 4 (four) times daily -  before meals  and at bedtime. Per patient take 20 mg by mouth 4 times daily [provider]  Active   ferrous gluconate  (FERGON) 324 MG tablet [id number]  Take 1 tablet (324 mg total) by mouth daily with breakfast.  Patient not taking: Reported on 06/12/2024   Drusilla Sabas RAMAN, MD  Active   FLUoxetine  (PROZAC ) 20 MG tablet [id number] Yes TAKE 1 TABLET BY MOUTH EVERY DAY Paseda, Folashade R, FNP  Active   insulin  degludec (TRESIBA  FLEXTOUCH) 100 UNIT/ML FlexTouch Pen [id number] Yes Inject 44 Units into the skin at bedtime.  Patient taking differently: Inject 40 Units into the skin at bedtime. Patient states she is taking 40 units at bedtime   Paseda, Folashade R, FNP  Active Self, Pharmacy Records  metFORMIN  (GLUCOPHAGE -XR) 500 MG 24 hr tablet [id number] Yes TAKE 1 TABLET (500MG ) BY MOUTH DAILY WITH BREAKFAST, AFTER 1 WEEK INCREASE TO 500 MG TWICE DAILY WITH FOOD  Patient taking differently: Patient states this medication is on pause until she talks with her primary care provider.   Oley Bascom RAMAN, NP  Active   ondansetron  (ZOFRAN -ODT) 4 MG disintegrating tablet [id number]  Take 1 tablet (4 mg total) by mouth every 8 (eight) hours as needed for nausea or vomiting.  Patient not taking: Reported on 06/12/2024   Dreama Longs, MD  Active Self, Pharmacy Records  polyethylene glycol powder (MIRALAX ) 17 GM/SCOOP powder [id number]  Take 17 g by mouth 2 (two) times daily. Decrease to daily if you develop diarrhea.  Patient not taking: Reported on 06/12/2024   Briana Elgin LABOR, MD  Active Self, Pharmacy Records  predniSONE  (DELTASONE ) 10 MG tablet [id number]  Take 40mg  (4 tablets) a day for 1 week, 30mg  a day (3 tablets) for  the second week, 20mg  (2 tablets) a day for the third week, 10mg  (1 tablet) a day for the fourth week.  Patient not taking: Reported on 06/12/2024   Dreama Longs, MD  Active Self, Pharmacy Records           Med Note (CRUTHIS, CHLOE C   Mon Jan 06, 2024 10:03 AM) Pt stated she is taking 10 mg  once daily.   rosuvastatin  (CRESTOR ) 5 MG tablet [id number] Yes Take 1 tablet (5 mg total) by mouth daily. Paseda, Folashade R, FNP  Active Self, Pharmacy Records  Semaglutide , 1 MG/DOSE, (OZEMPIC , 1 MG/DOSE,) 4 MG/3ML SOPN [id number]  INJECT 1 MG ONCE A WEEK AS DIRECTED  Patient not taking: Reported on 06/12/2024   Paseda, Folashade R, FNP  Active   Semaglutide ,0.25 or 0.5MG /DOS, 2 MG/3ML NELMA [id number]  Inject 0.5 mg into the skin once a week.  Patient not taking: Reported on 06/12/2024   Paseda, Folashade R, FNP  Active             Home Care and Equipment/Supplies: Were Home Health Services Ordered?: No Any new equipment or medical supplies ordered?: No  Functional Questionnaire: Do you need assistance with bathing/showering or dressing?: No Do you need assistance with meal preparation?: No Do you need assistance with eating?: No Do you have difficulty maintaining continence: No Do you need assistance with getting out of bed/getting out of a chair/moving?: No Do you have difficulty managing or taking your medications?: No  Follow up appointments reviewed: PCP Follow-up appointment confirmed?: Yes Date of PCP follow-up appointment?: 06/16/24 Follow-up Provider: [facility] Follow-up appointment confirmed?: Yes Date of Specialist follow-up appointment?: 06/12/24 Follow-Up Specialty Provider:: Dr. Ladena Do you need transportation to your follow-up appointment?: No Do you understand care options if your condition(s) worsen?: Yes-patient verbalized understanding  SDOH Interventions Today    Flowsheet Row Most Recent Value  SDOH Interventions   Food Insecurity Interventions Intervention Not Indicated  Housing Interventions Intervention Not Indicated  Transportation Interventions Intervention Not Indicated  Utilities Interventions Intervention Not Indicated   Discussed and offered 30 day TOC program.  Patient declined.   The patient has been provided  with contact information for the care management team and has been advised to call with any health -related questions or concerns.  The patient verbalized understanding with current plan of care.  The patient is directed to their insurance card regarding availability of benefits coverage.    Arvin Seip RN, BSN, CCM [employer], Population Health Case Manager Phone: [phone]

## 2024-06-15 ENCOUNTER — Telehealth: Payer: Self-pay | Admitting: Nurse Practitioner

## 2024-06-16 ENCOUNTER — Inpatient Hospital Stay: Payer: Self-pay | Admitting: Nurse Practitioner

## 2024-06-16 NOTE — Telephone Encounter (Signed)
Lvm for pt to call back to be scheduled. Kh

## 2024-07-01 NOTE — Telephone Encounter (Signed)
 Error
